# Patient Record
Sex: Female | Born: 1945 | Race: Black or African American | Hispanic: No | Smoking: Never smoker
Health system: Southern US, Community
[De-identification: ages and names within clinical notes are randomized; demographics above are authoritative.]

## PROBLEM LIST (undated history)

## (undated) DIAGNOSIS — J45909 Unspecified asthma, uncomplicated: Secondary | ICD-10-CM

## (undated) DIAGNOSIS — E119 Type 2 diabetes mellitus without complications: Secondary | ICD-10-CM

## (undated) DIAGNOSIS — E039 Hypothyroidism, unspecified: Secondary | ICD-10-CM

## (undated) DIAGNOSIS — I10 Essential (primary) hypertension: Secondary | ICD-10-CM

## (undated) HISTORY — PX: BREAST CYST ASPIRATION: SHX578

## (undated) HISTORY — PX: OTHER SURGICAL HISTORY: SHX169

## (undated) HISTORY — PX: HERNIA REPAIR: SHX51

## (undated) HISTORY — PX: TUBAL LIGATION: SHX77

## (undated) HISTORY — PX: ABDOMINAL HYSTERECTOMY: SHX81

---

## 2004-01-02 ENCOUNTER — Other Ambulatory Visit: Payer: Self-pay

## 2008-12-15 ENCOUNTER — Emergency Department: Payer: Self-pay | Admitting: Emergency Medicine

## 2009-04-01 ENCOUNTER — Observation Stay: Payer: Self-pay | Admitting: Specialist

## 2010-03-15 ENCOUNTER — Ambulatory Visit: Payer: Self-pay | Admitting: Internal Medicine

## 2010-03-15 IMAGING — MG MAM DGTL SCREENING MAMMO W/CAD
1 series · 4 of 4 positions shown · non-contrast
Comparison: none

REASON FOR EXAM: SCREENING
COMMENTS:

[Series 3766: R CC · right · 4 of 4 slices shown]
[im 1/4]
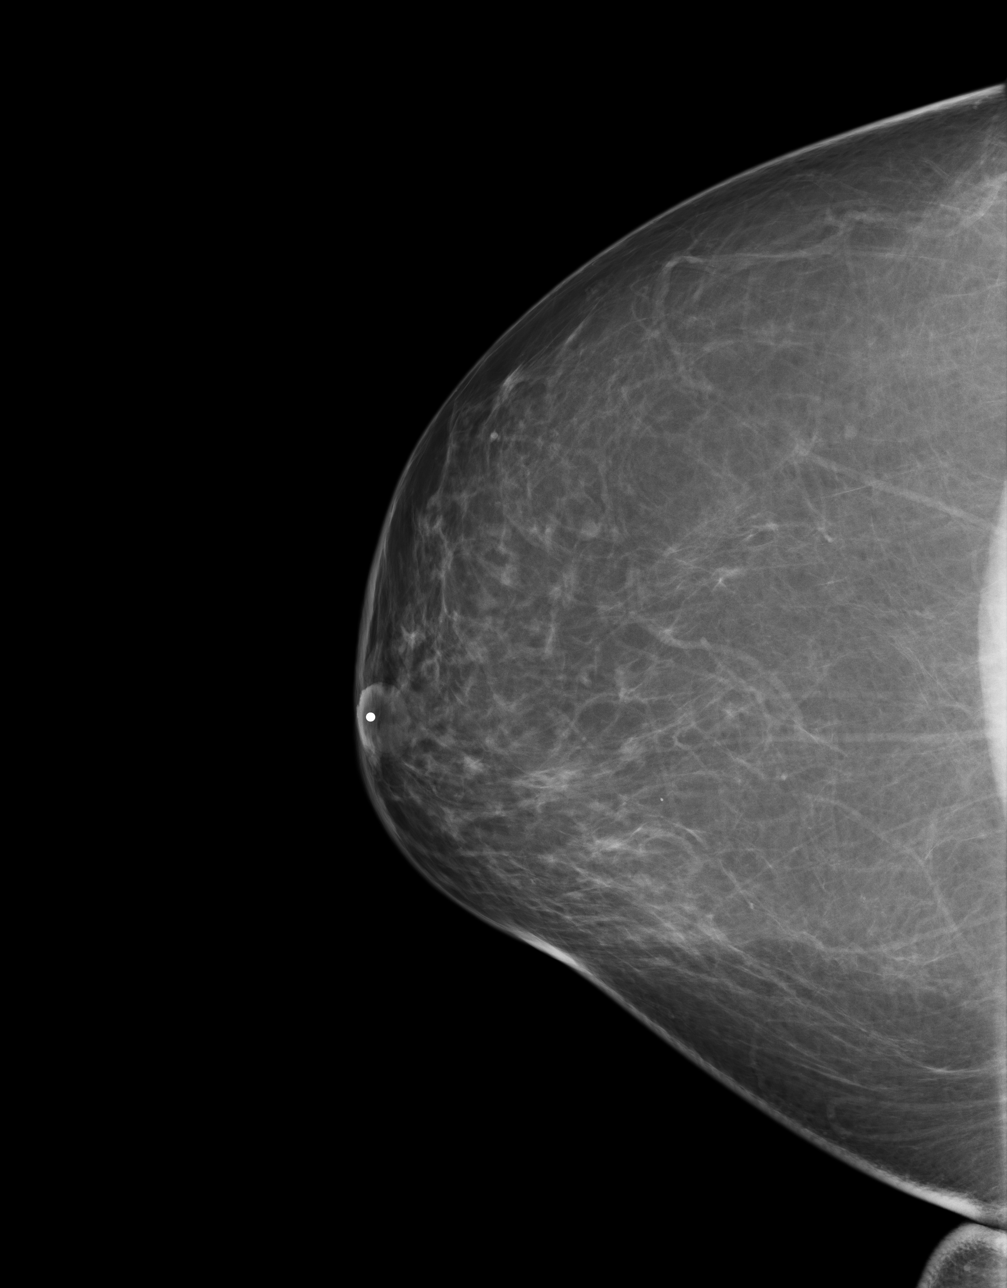
[im 2/4]
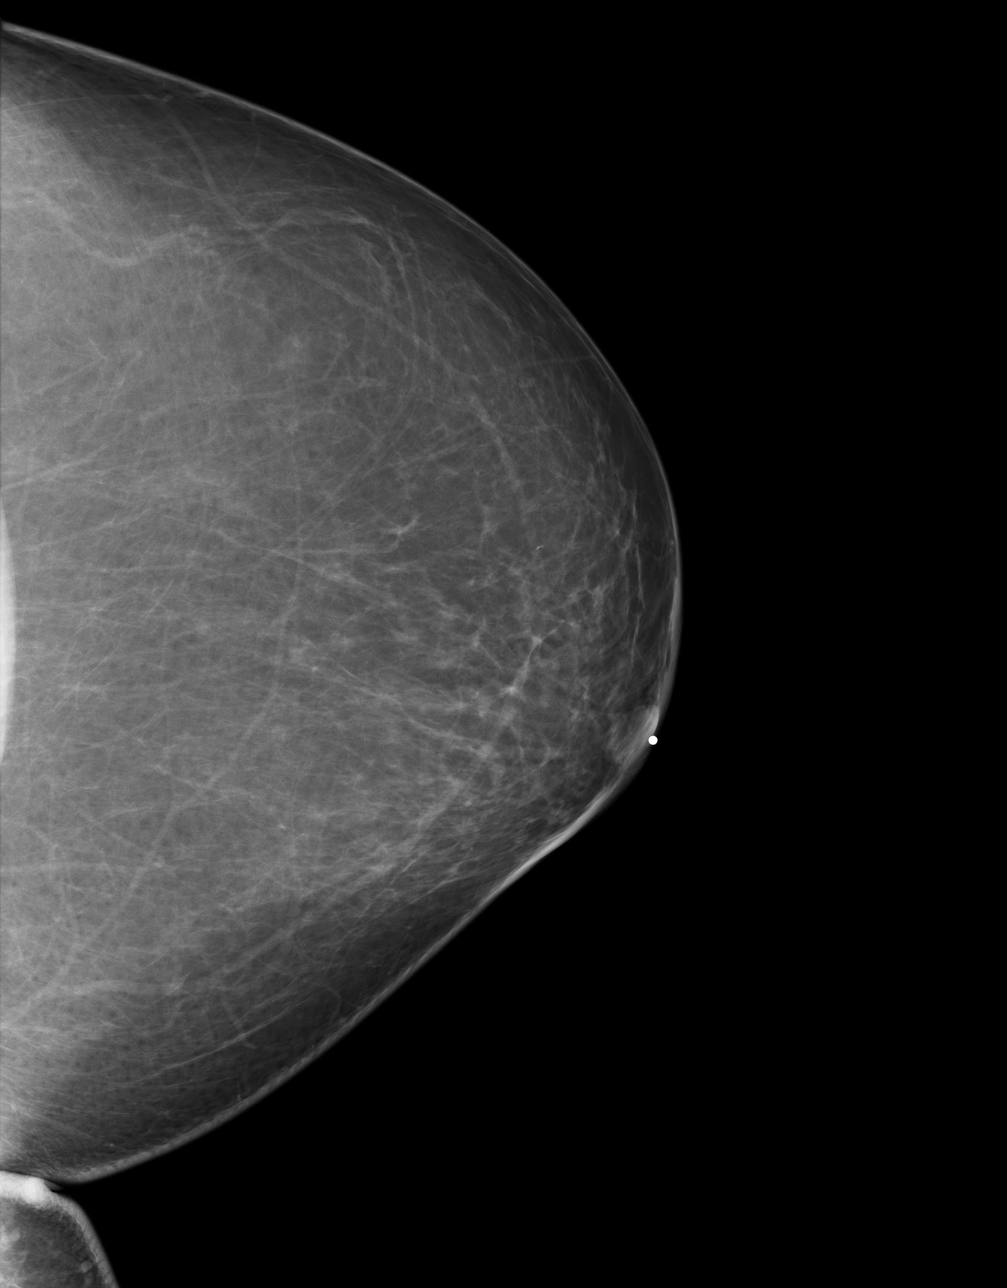
[im 3/4]
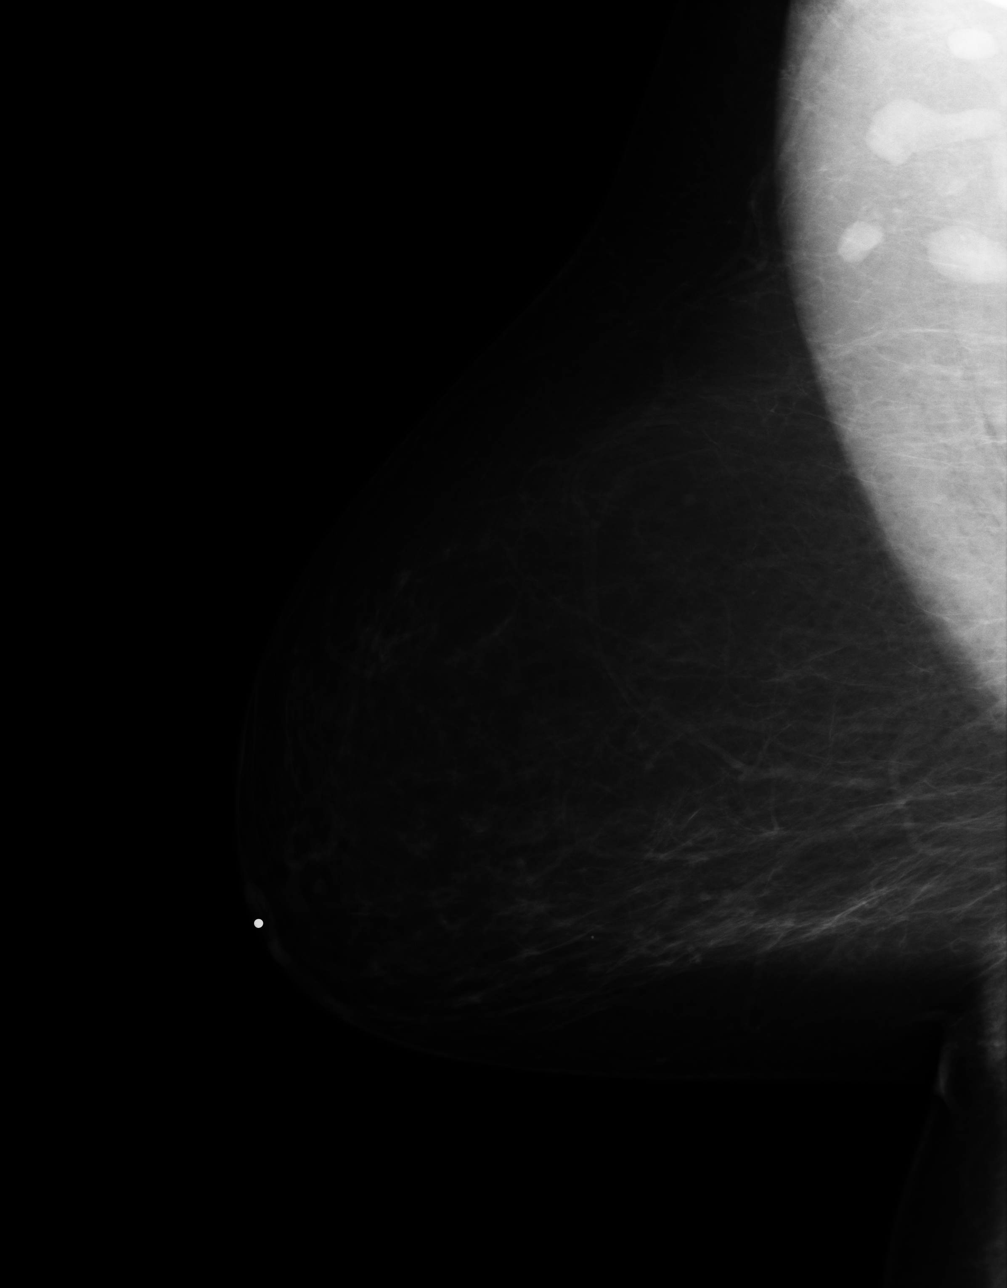
[im 4/4]
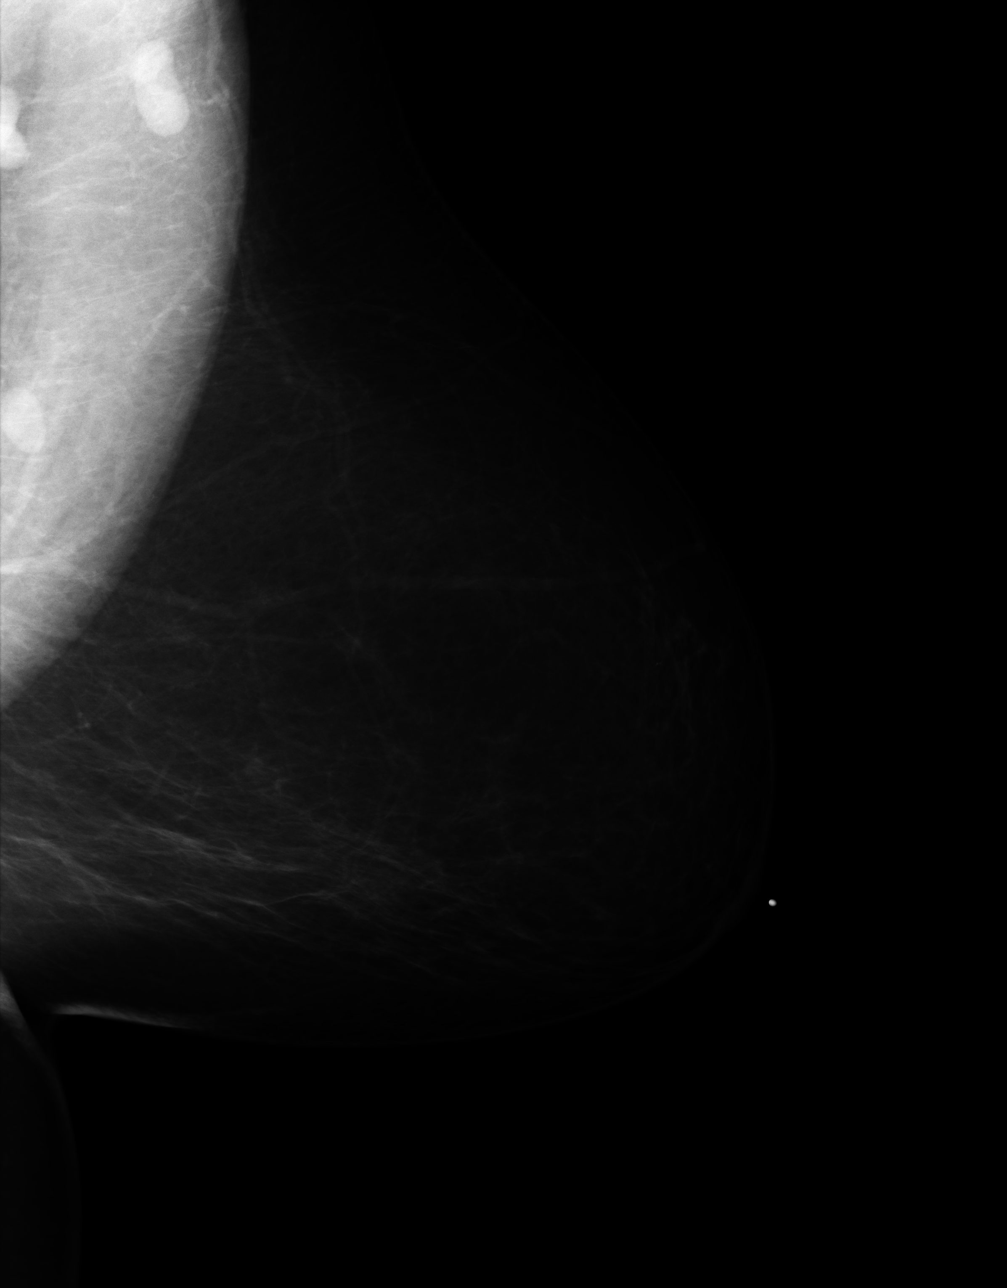

[4 of 4 positions shown; findings below may reference images not displayed]

PROCEDURE:     MAM - MAM DGTL SCREENING MAMMO W/CAD  - [DATE]  [DATE]

RESULT:     Comparison is made to the previous outside images from [REDACTED] [facility] in [HOSPITAL][HOSPITAL] dated [DATE] as well as [DATE].

The breasts exhibit a mildly dense parenchymal pattern without an area of
developing density or dominant mass. No malignant appearing calcification or
architectural distortion is present. The appearance is stable.
IMPRESSION: Stable, benign appearing bilateral mammogram.

BI-RADS: Category 2 - Benign Findings

RECOMMENDATION:  Please continue to encourage annual mammographic follow-up.

Thank you for this opportunity to contribute to the care of your patient.

A NEGATIVE MAMMOGRAM REPORT DOES NOT PRECLUDE BIOPSY OR OTHER EVALUATION OF
A CLINICALLY PALPABLE OR OTHERWISE SUSPICIOUS MASS OR LESION. BREAST CANCER
MAY NOT BE DETECTED BY MAMMOGRAPHY IN UP TO 10% OF CASES.

## 2010-09-10 ENCOUNTER — Ambulatory Visit: Payer: Self-pay | Admitting: Internal Medicine

## 2010-09-10 IMAGING — CT CT ABD-PELV W/ CM
1 of 2 series · 15 of 32 positions shown, 19 images · non-contrast
Comparison: none

REASON FOR EXAM: Abd Pain Knot Near Navel Eval for Hernia
COMMENTS:

[Series 2: abdomen · axial · 0.81mm/px · z∈[-929,-494]mm · 15 of 95 slices shown, 19 images]
[im 4/95  soft-tissue]
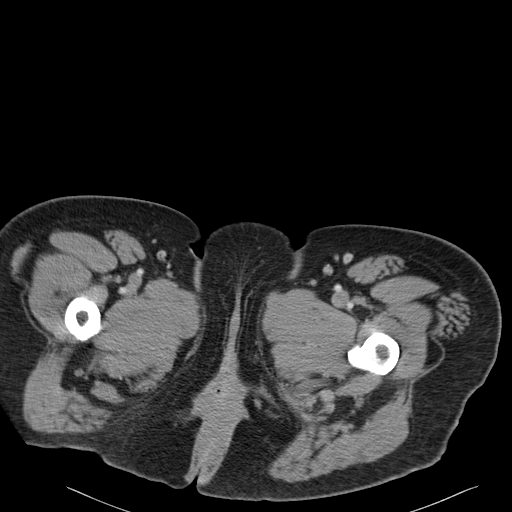
[im 4/95  bone]
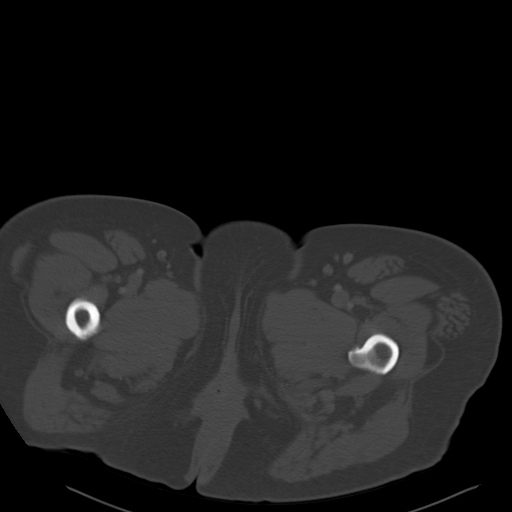
[im 12/95  soft-tissue]
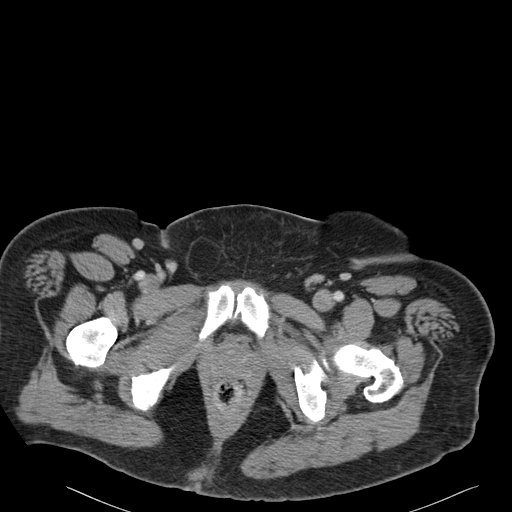
[im 20/95  soft-tissue]
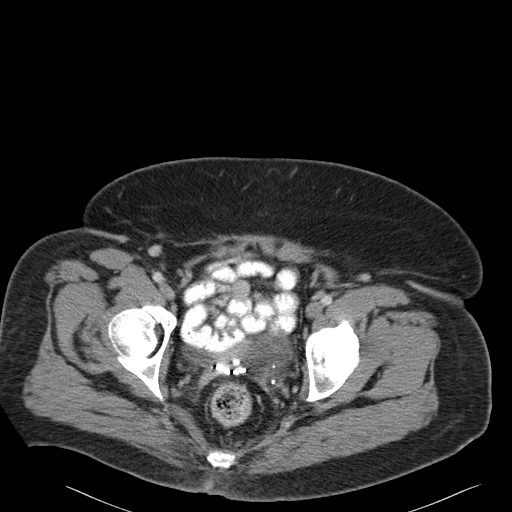
[im 28/95  soft-tissue]
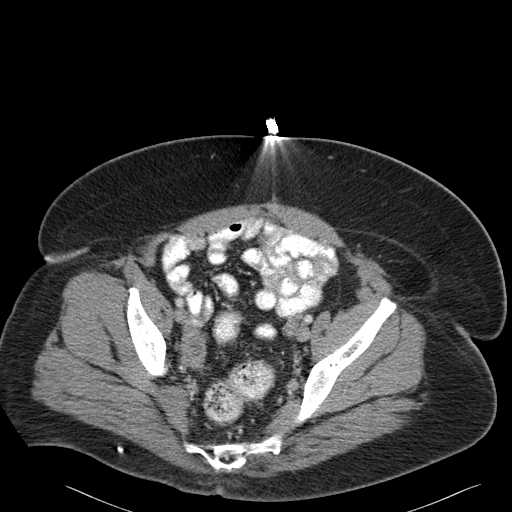
[im 32/95  soft-tissue]
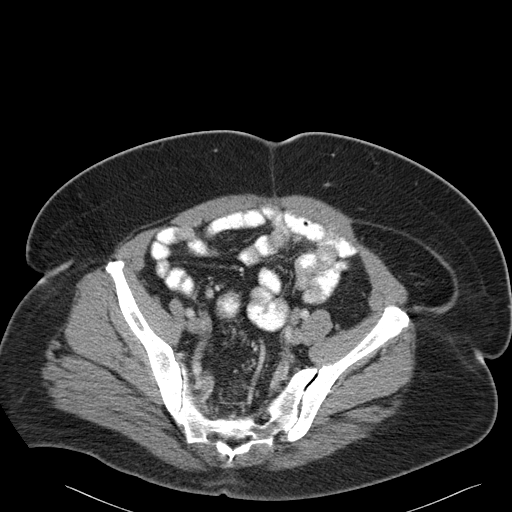
[im 40/95  soft-tissue]
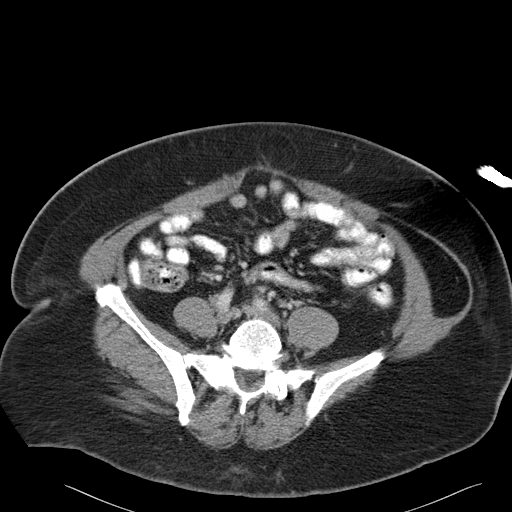
[im 48/95  soft-tissue]
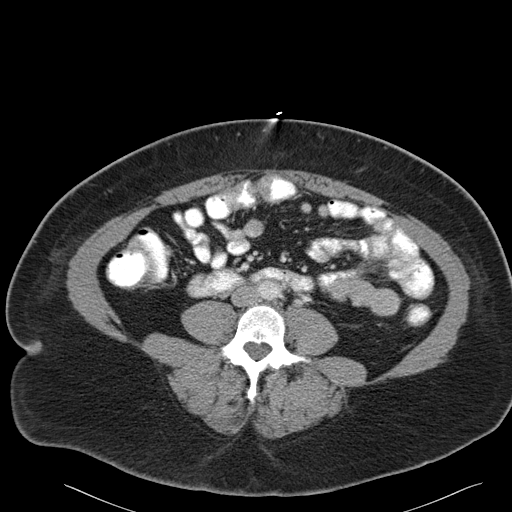
[im 55/95  soft-tissue]
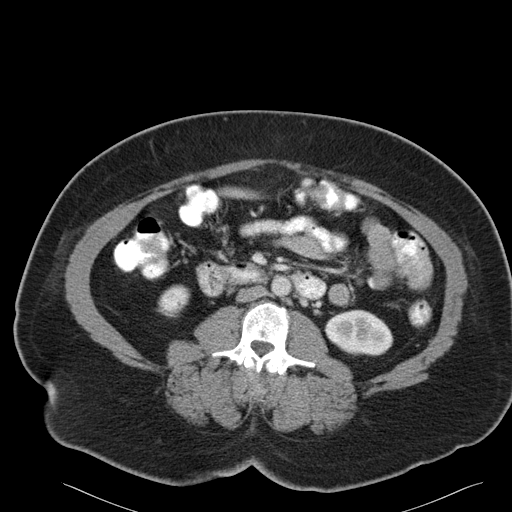
[im 63/95  soft-tissue]
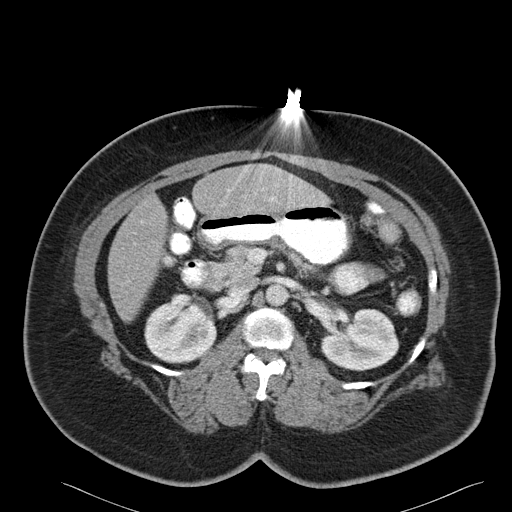
[im 63/95  bone]
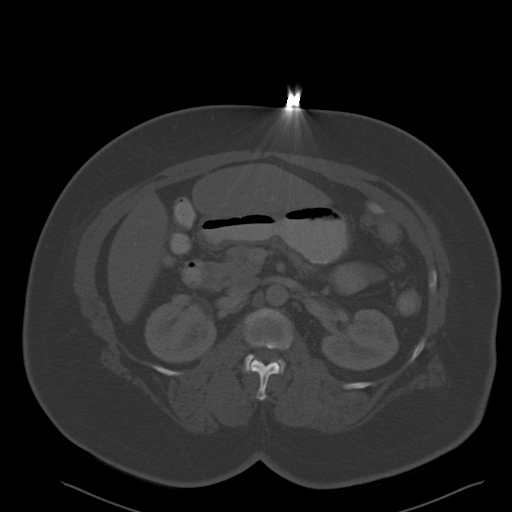
[im 67/95  soft-tissue]
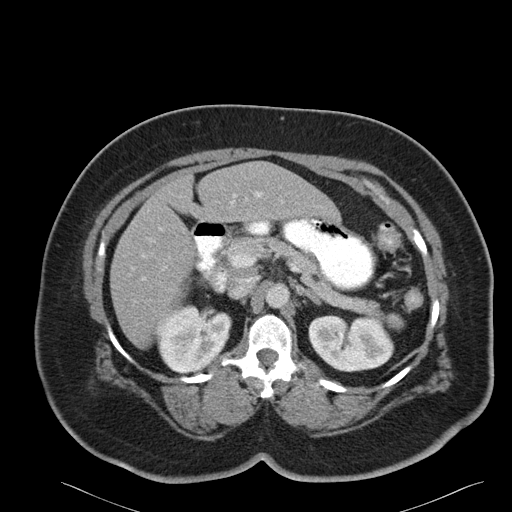
[im 75/95  soft-tissue]
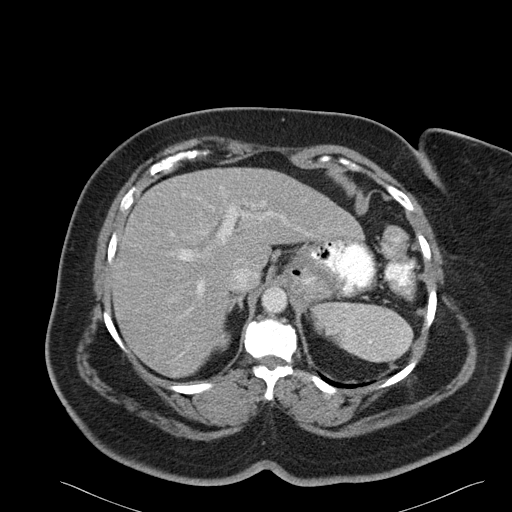
[im 79/95  lung]
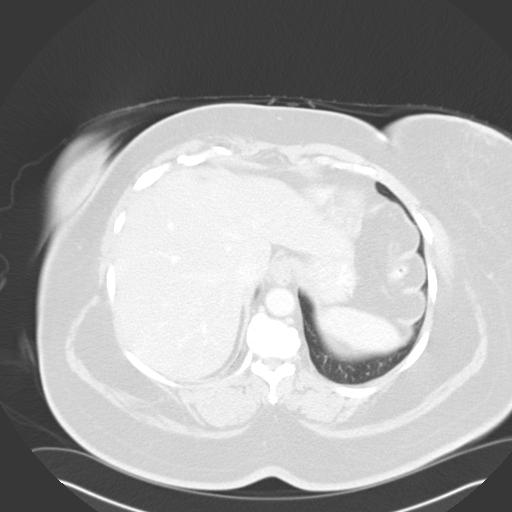
[im 83/95  soft-tissue]
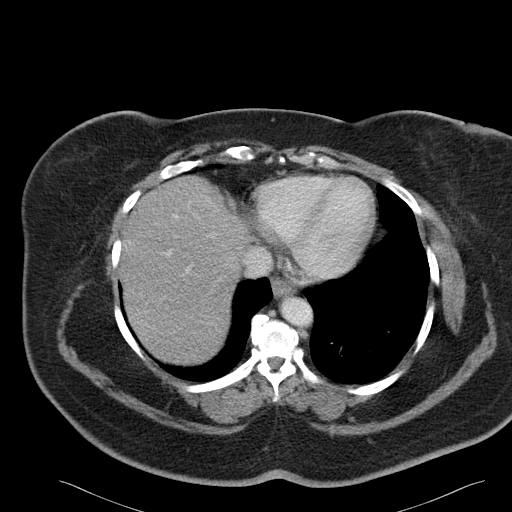
[im 83/95  lung]
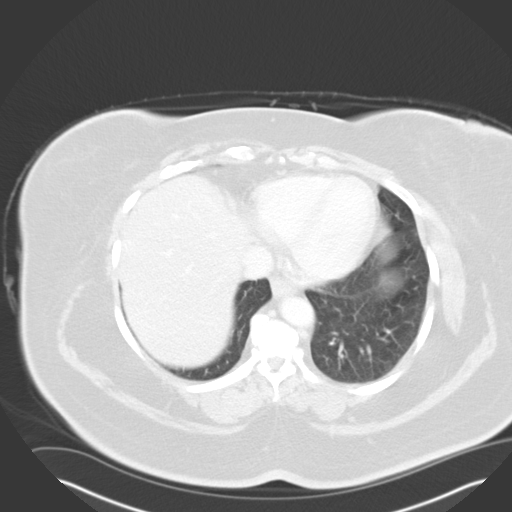
[im 87/95  lung]
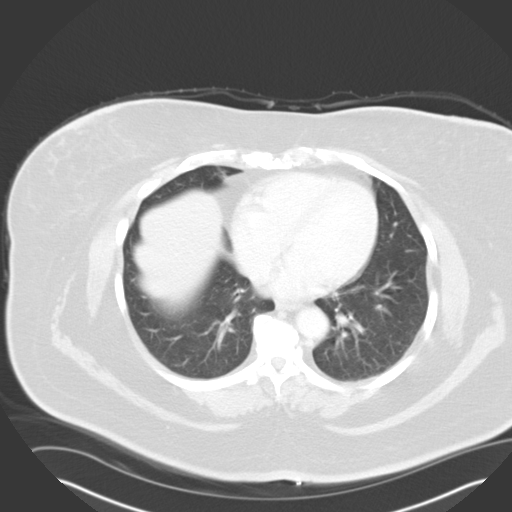
[im 91/95  soft-tissue]
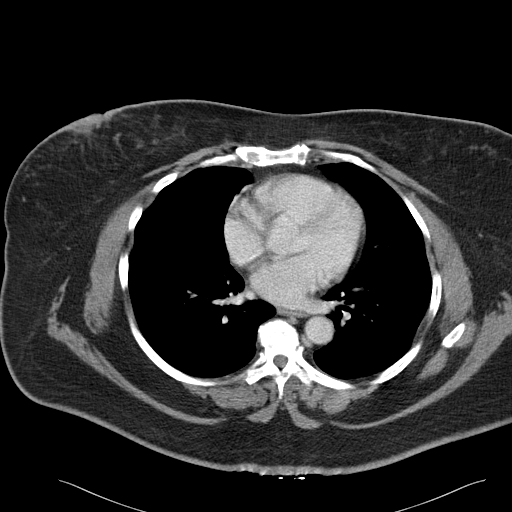
[im 91/95  lung]
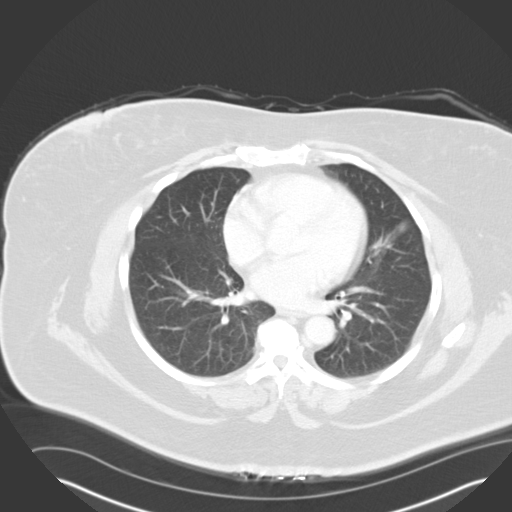

[15 of 32 positions shown; findings below may reference images not displayed]

PROCEDURE:     CT  - CT ABDOMEN / PELVIS  W  - [DATE]  [DATE]

RESULT:     Axial CT scanning was performed through the abdomen and pelvis
at 5 mm intervals and slice thicknesses following intravenous administration
of 100 cc of [U1] as well as administration of oral contrast material.
Review of multiplanar reconstructed images was performed separately on the
VIA monitor.

There is a periumbilical hernia. This hernia contains only fat in its most
superficial portion. In the deeper portion there is a loop of normal
appearing small bowel. There is no evidence of incarceration. The partially
contrast-filled loops of small and large bowel exhibit no evidence of
obstruction. The liver, spleen, partially distended stomach, pancreas,
adrenal glands, and kidneys are normal in appearance. The caliber of the
abdominal aorta is normal. I see no periaortic or pericaval lymphadenopathy.
Within the pelvis the urinary bladder is normal in appearance. The uterus is
apparently surgically absent. I see no suspicious appearing adnexal masses.
There is a structure which likely reflects an ovary on the right on image 69
and on the left on image 70. The lumbar vertebral bodies are preserved in
height. The lung bases are clear.
IMPRESSION: 1. There is a periunbilical hernia which contains only fat. However, closely
applied to its deeper portion there is a loop of small bowel. There may be
transient movement of this bowel loop into the hernia with changes in
position and intra-abdominal pressure.
2. There is no evidence of bowel obstruction.
3. I see no acute hepatobiliary abnormality nor acute abnormality of the
urinary tracts. The lumbar vertebral bodies are preserved in height. The
lung bases are clear.

## 2010-10-12 ENCOUNTER — Ambulatory Visit: Payer: Self-pay | Admitting: Surgery

## 2010-10-19 ENCOUNTER — Ambulatory Visit: Payer: Self-pay | Admitting: Surgery

## 2011-11-04 ENCOUNTER — Ambulatory Visit: Payer: Self-pay

## 2011-11-04 IMAGING — MG MAM DGTL SCRN MAM NO ORDER W/CAD
1 series · 4 of 4 positions shown · non-contrast
Comparison: none

REASON FOR EXAM: SCR MAMMO NO ORDER
COMMENTS:

[Series 9852: R CC · right · 4 of 4 slices shown]
[im 1/4]
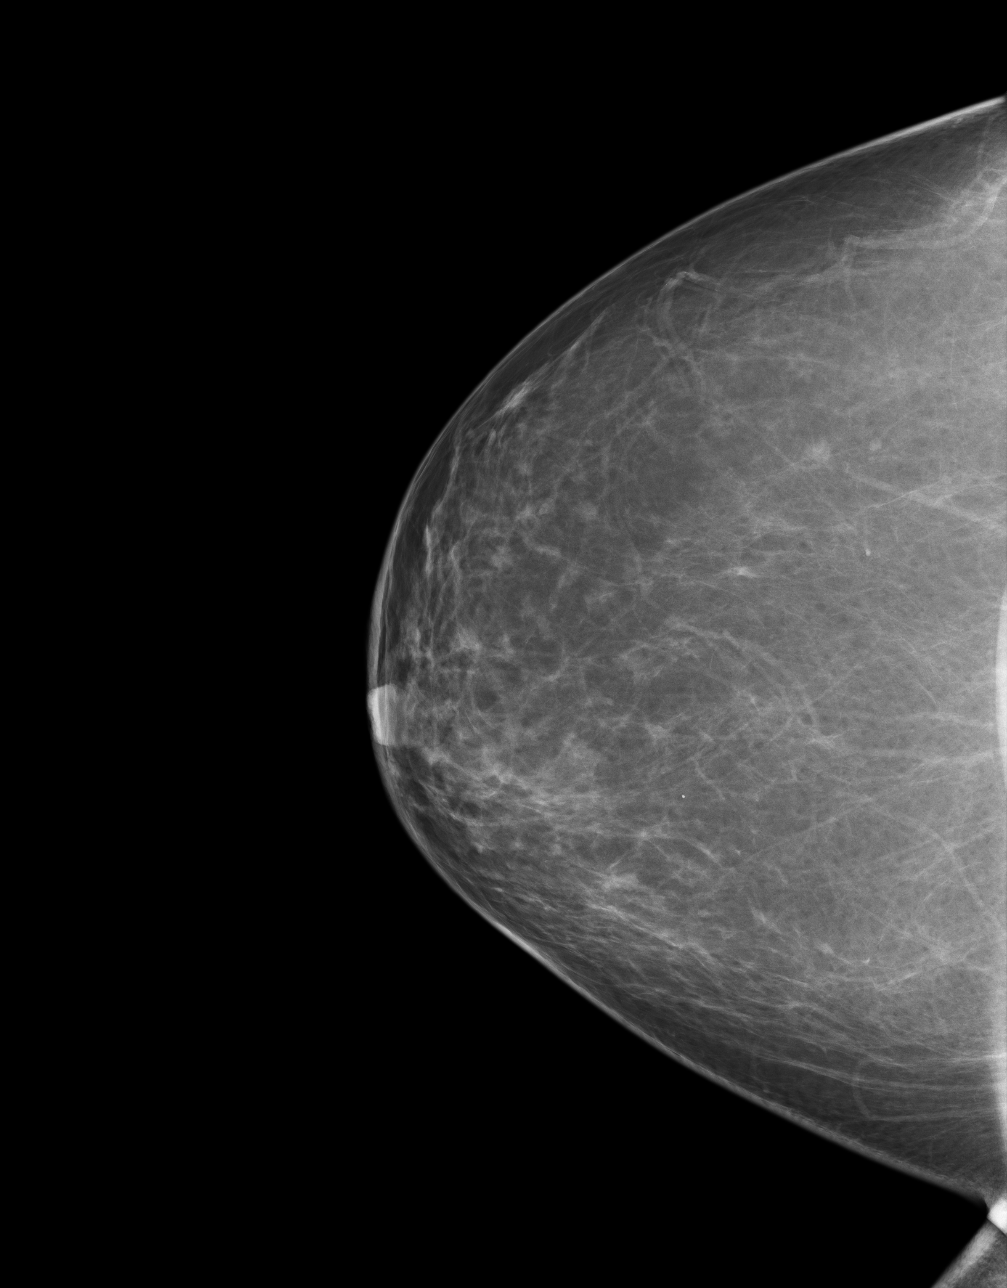
[im 2/4]
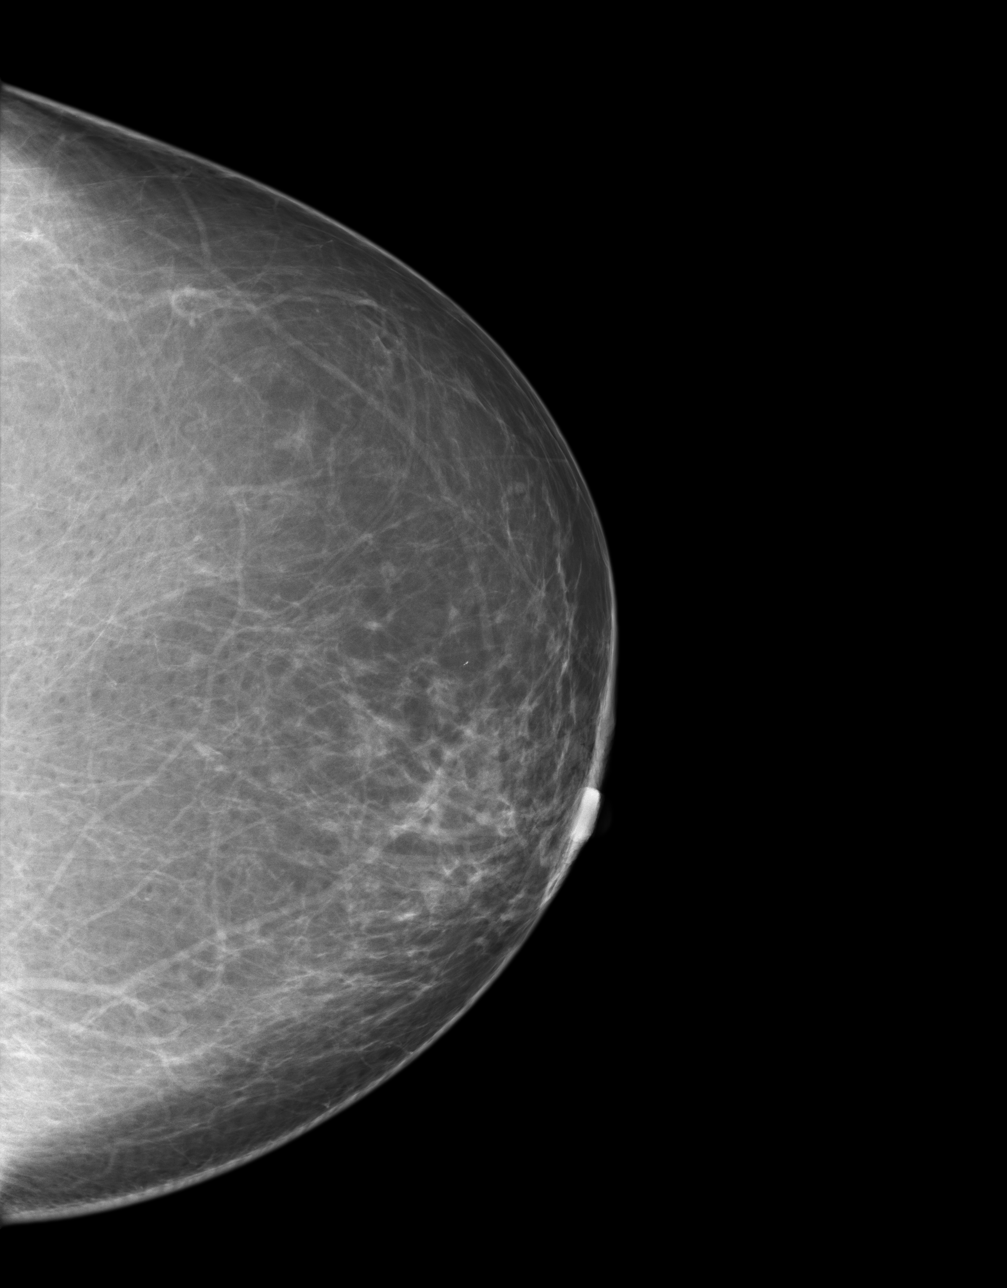
[im 3/4]
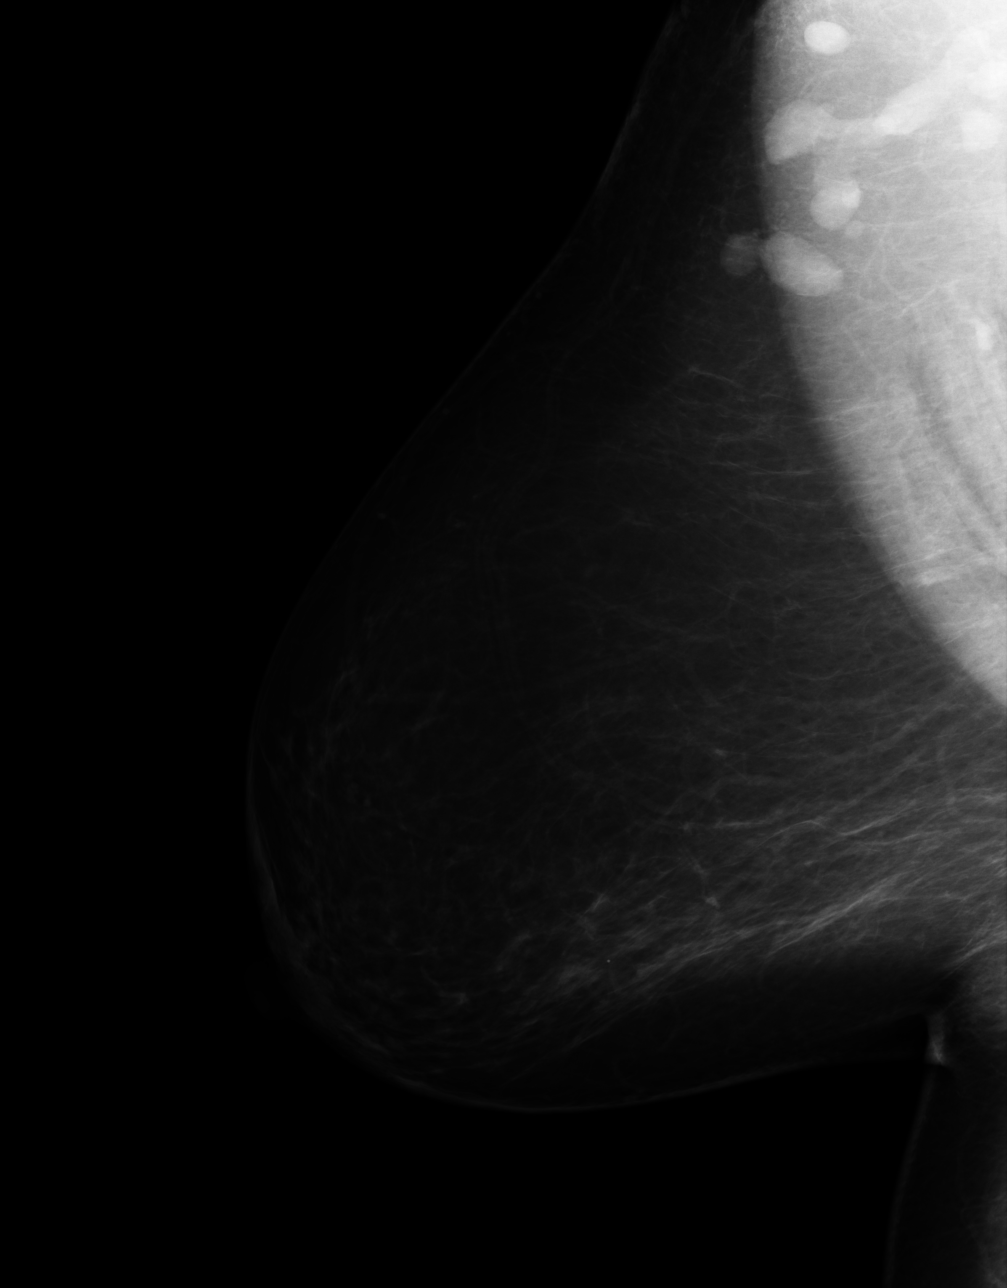
[im 4/4]
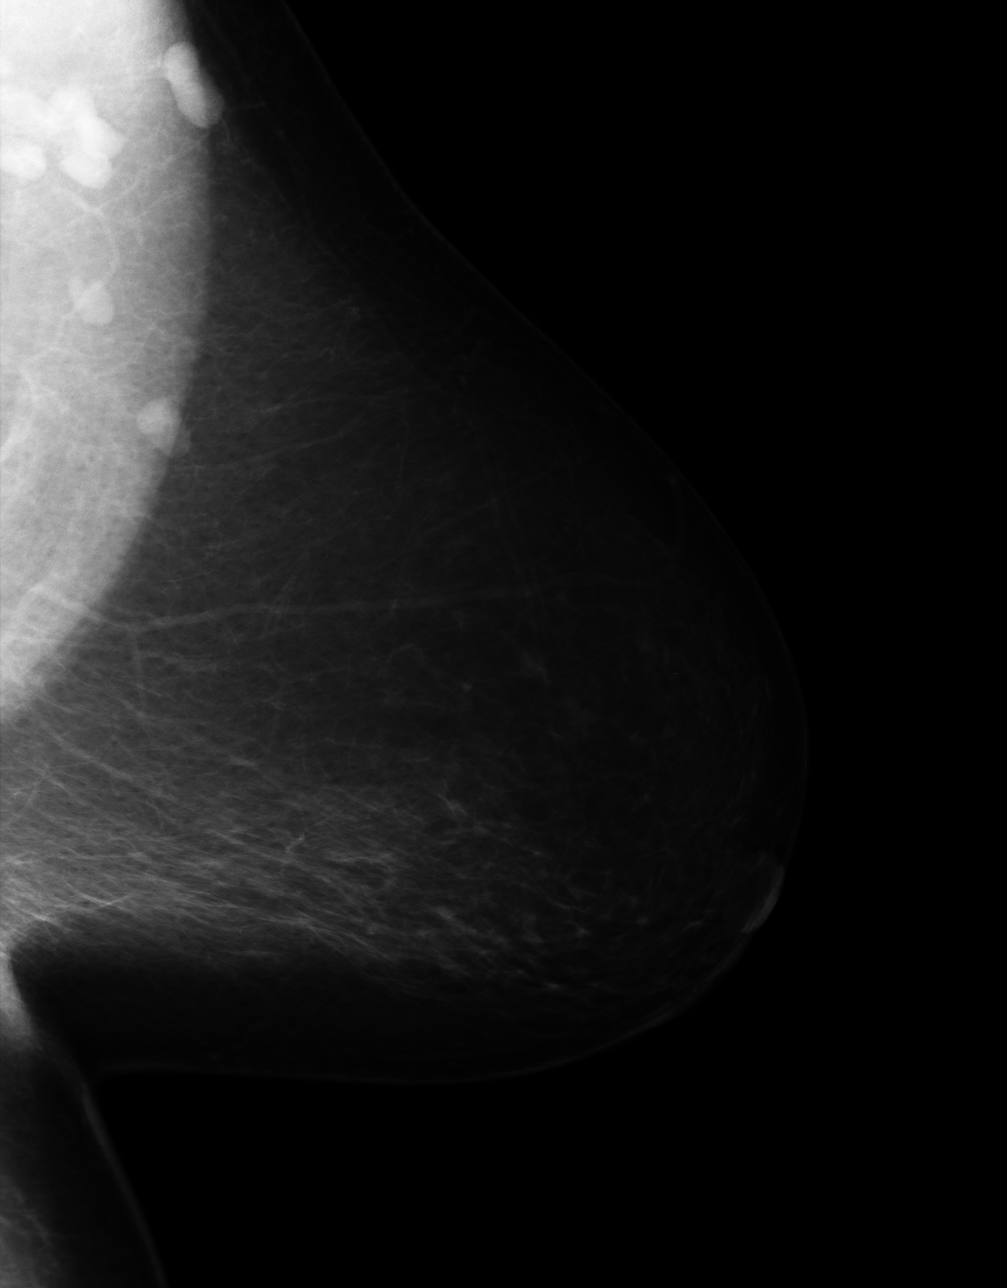

[4 of 4 positions shown; findings below may reference images not displayed]

PROCEDURE:     MAM - MAM DGTL SCRN MAM NO ORDER W/CAD  - [DATE]  [DATE]

RESULT:

The breasts demonstrate a mild heterogeneous, fibroglandular parenchymal
pattern. A small area of asymmetric, spiculated density projects in the
lateral portion of the right breast, approximately 10 cm from the nipple.
This likely represents a conglomeration of normal breast parenchyma and
further evaluation with magnification compression imaging is recommended.
There is no further radiographic evidence to suggest malignancy.
IMPRESSION: BI-RADS:  Category 0 - Needs Additional Imaging Evaluation

Thank you for this opportunity to contribute to the care of your patient.

A NEGATIVE MAMMOGRAM REPORT DOES NOT PRECLUDE BIOPSY OR OTHER EVALUATION OF
A CLINICALLY PALPABLE OR OTHERWISE SUSPICIOUS MASS OR LESION. BREAST CANCER
MAY NOT BE DETECTED BY MAMMOGRAPHY IN UP TO 10% OF CASES.

## 2011-11-14 ENCOUNTER — Ambulatory Visit: Payer: Self-pay

## 2012-07-02 ENCOUNTER — Emergency Department: Payer: Self-pay | Admitting: Emergency Medicine

## 2012-07-02 IMAGING — CR DG FOOT COMPLETE 3+V*L*
1 series · 3 of 3 positions shown · non-contrast
Comparison: none

REASON FOR EXAM: pain
COMMENTS:

PROCEDURE:     DXR - DXR FOOT LT COMP W/OBLIQUES  - [DATE]  [DATE]
RESULT:     Comparison: None.

[Series 1: x foot ap left · 0.14mm/px · 3 of 3 slices shown]
[im 1/3]
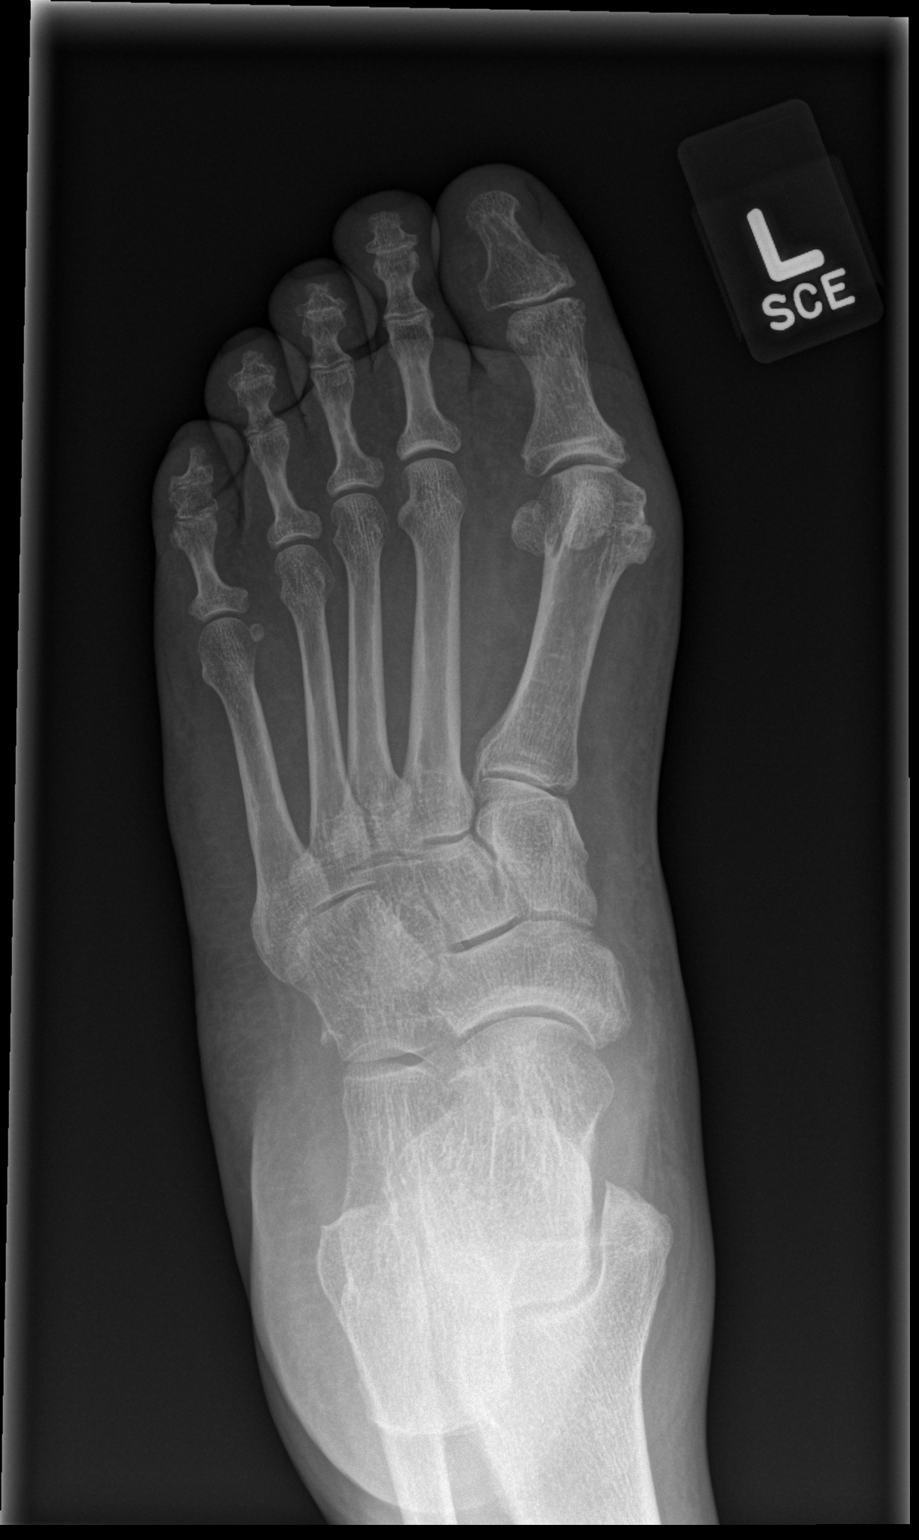
[im 2/3]
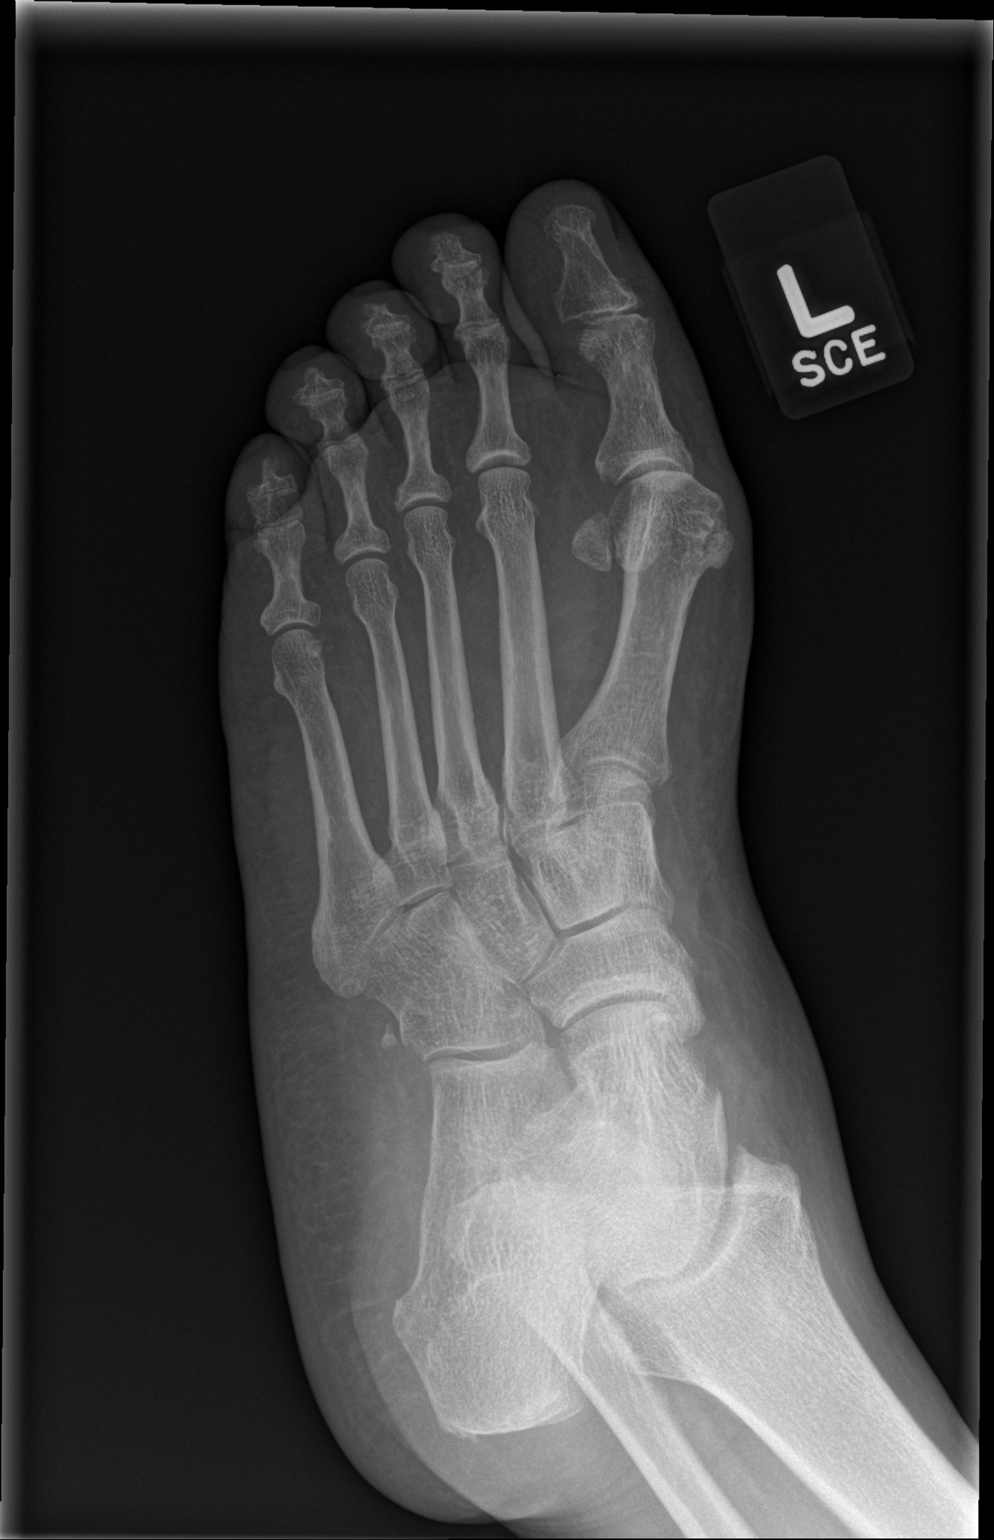
[im 3/3]
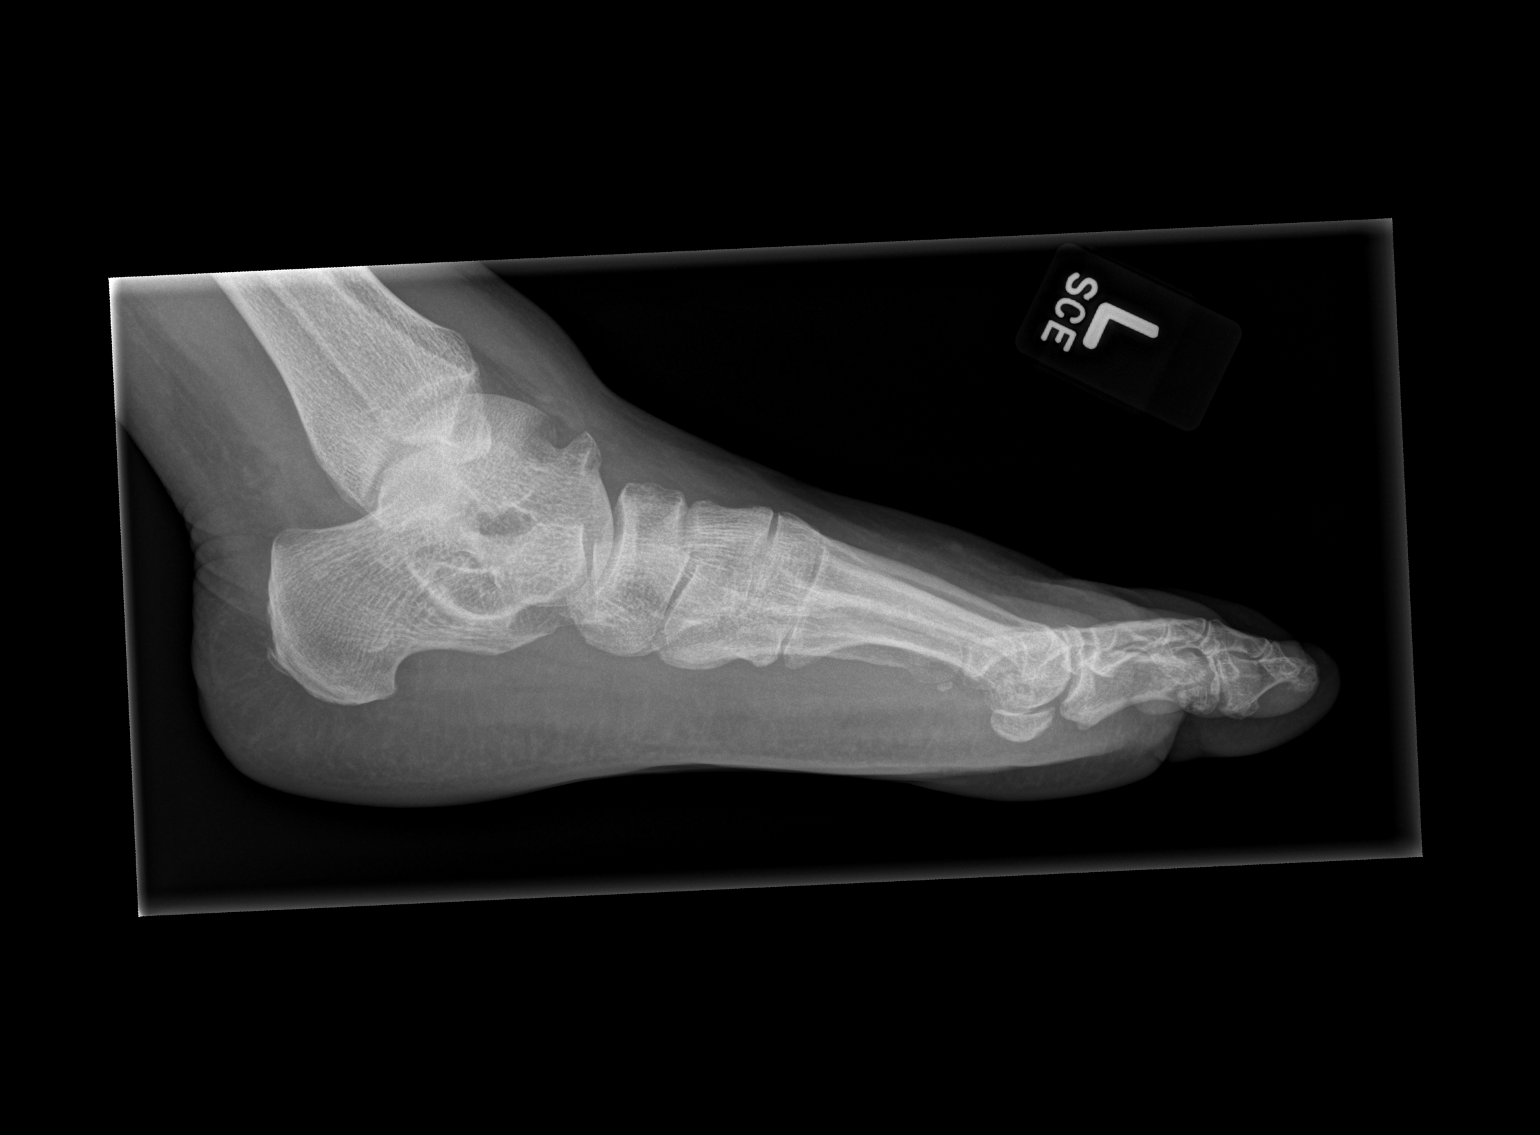

[3 of 3 positions shown; findings below may reference images not displayed]

FINDINGS: No acute fracture. There is hallux valgus. Joint spaces are relatively
preserved.
IMPRESSION: No acute findings.

[REDACTED]

## 2012-12-10 ENCOUNTER — Ambulatory Visit: Payer: Self-pay

## 2012-12-10 IMAGING — MG MM CAD SCREENING MAMMO
1 series · 4 of 4 positions shown · non-contrast
Comparison: none

REASON FOR EXAM: SCR MAMMO NO ORDER
COMMENTS:

PROCEDURE:     MAM - MAM DGTL SCRN MAM NO ORDER W/CAD  - [DATE] [DATE]
RESULT:     COMPARISON:  [DATE], [DATE]
TECHNIQUE: Digital screening mammograms were obtained. FDA approved
computer-aided detection (CAD) for mammography was utilized for this study.
BREAST COMPOSITION: The breast composition is SCATTERED FIBROGLANDULAR
TISSUE (glandular tissue is  25-50%)

[R CC · right · 4 of 4 slices shown]
[im 1/4]
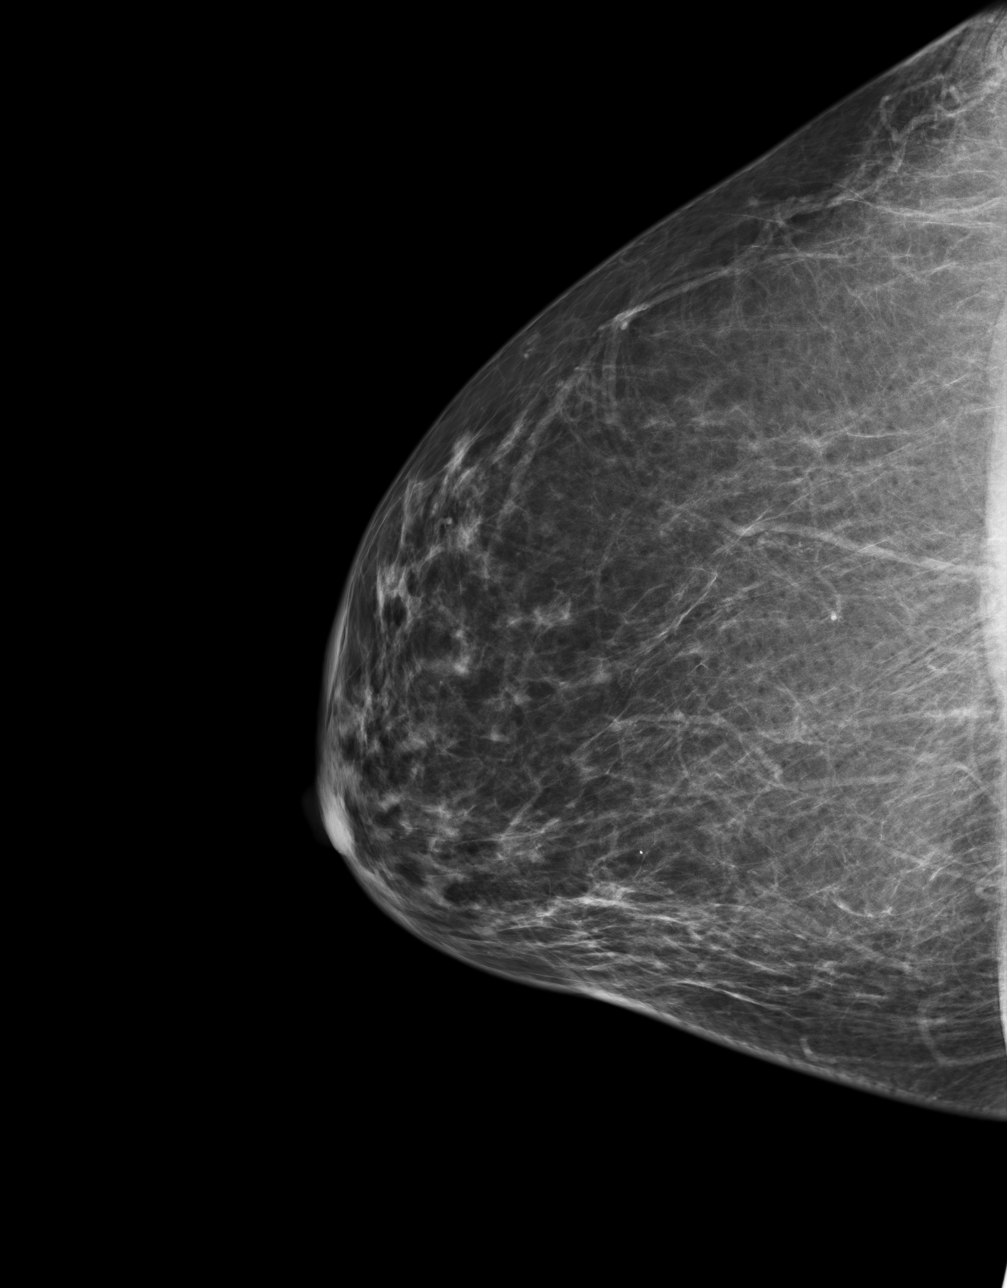
[im 2/4]
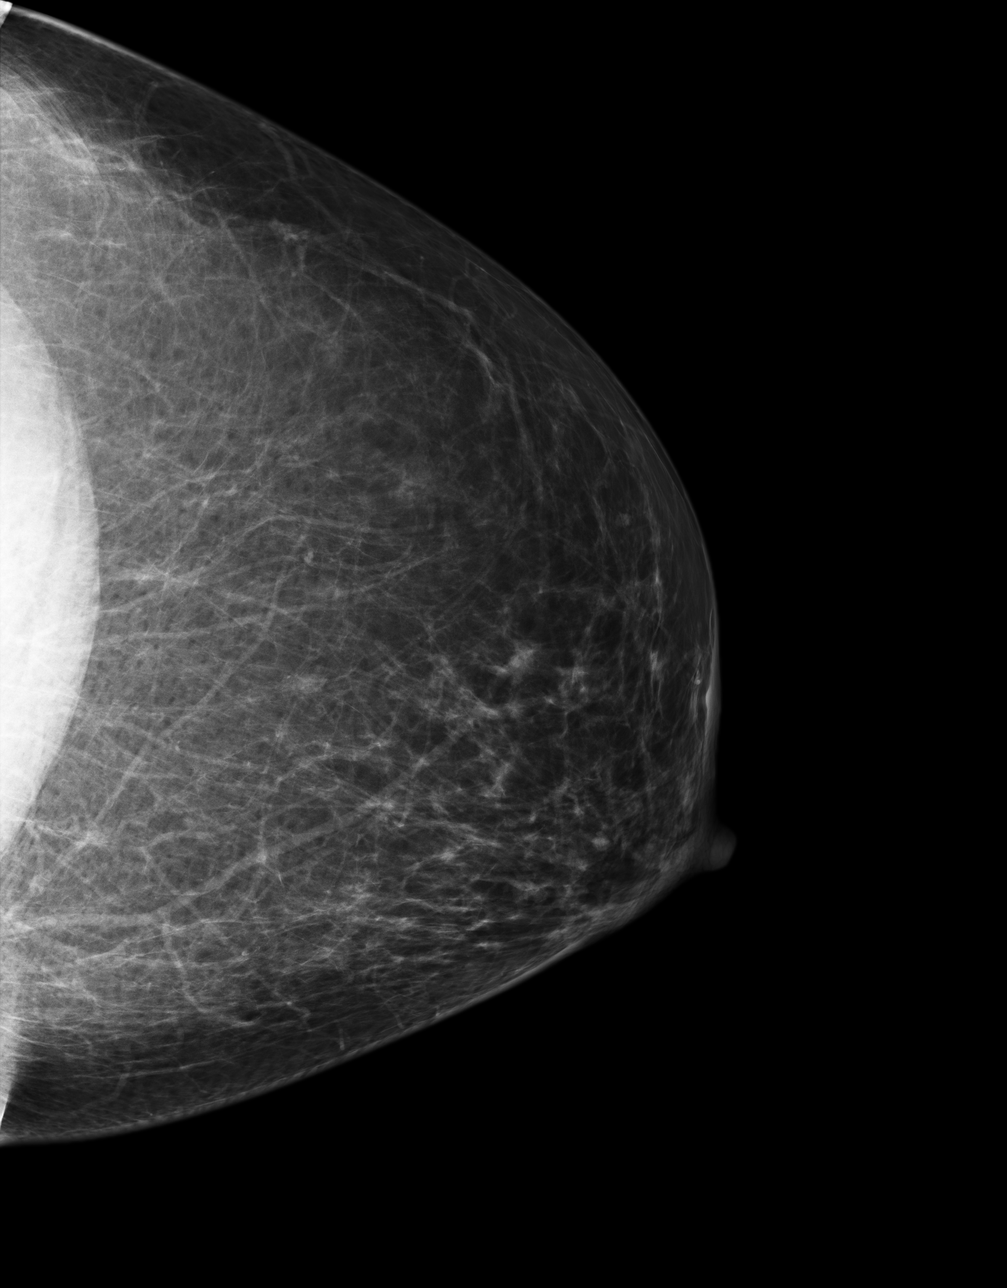
[im 3/4]
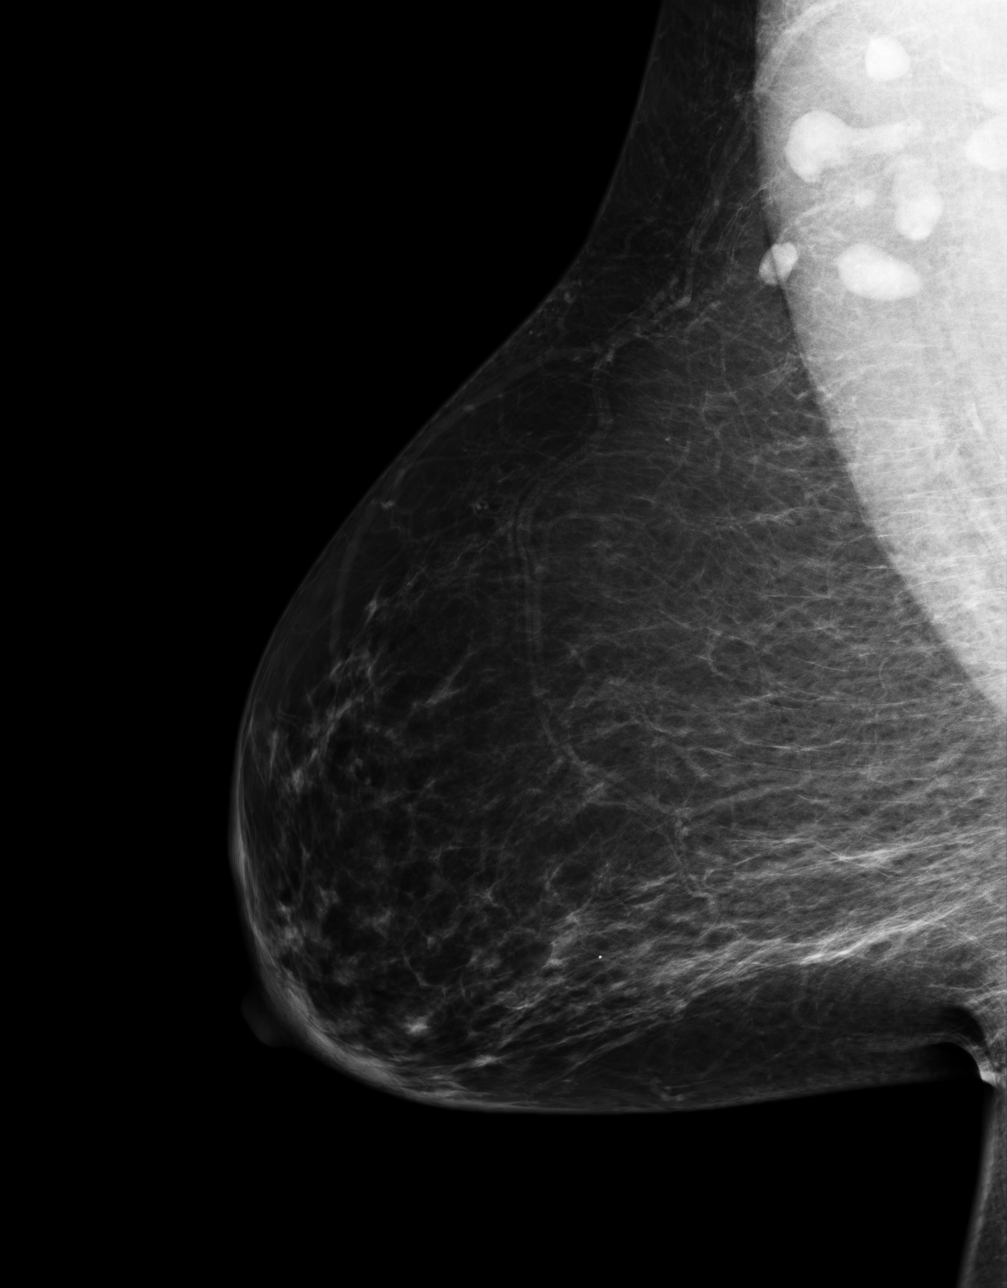
[im 4/4]
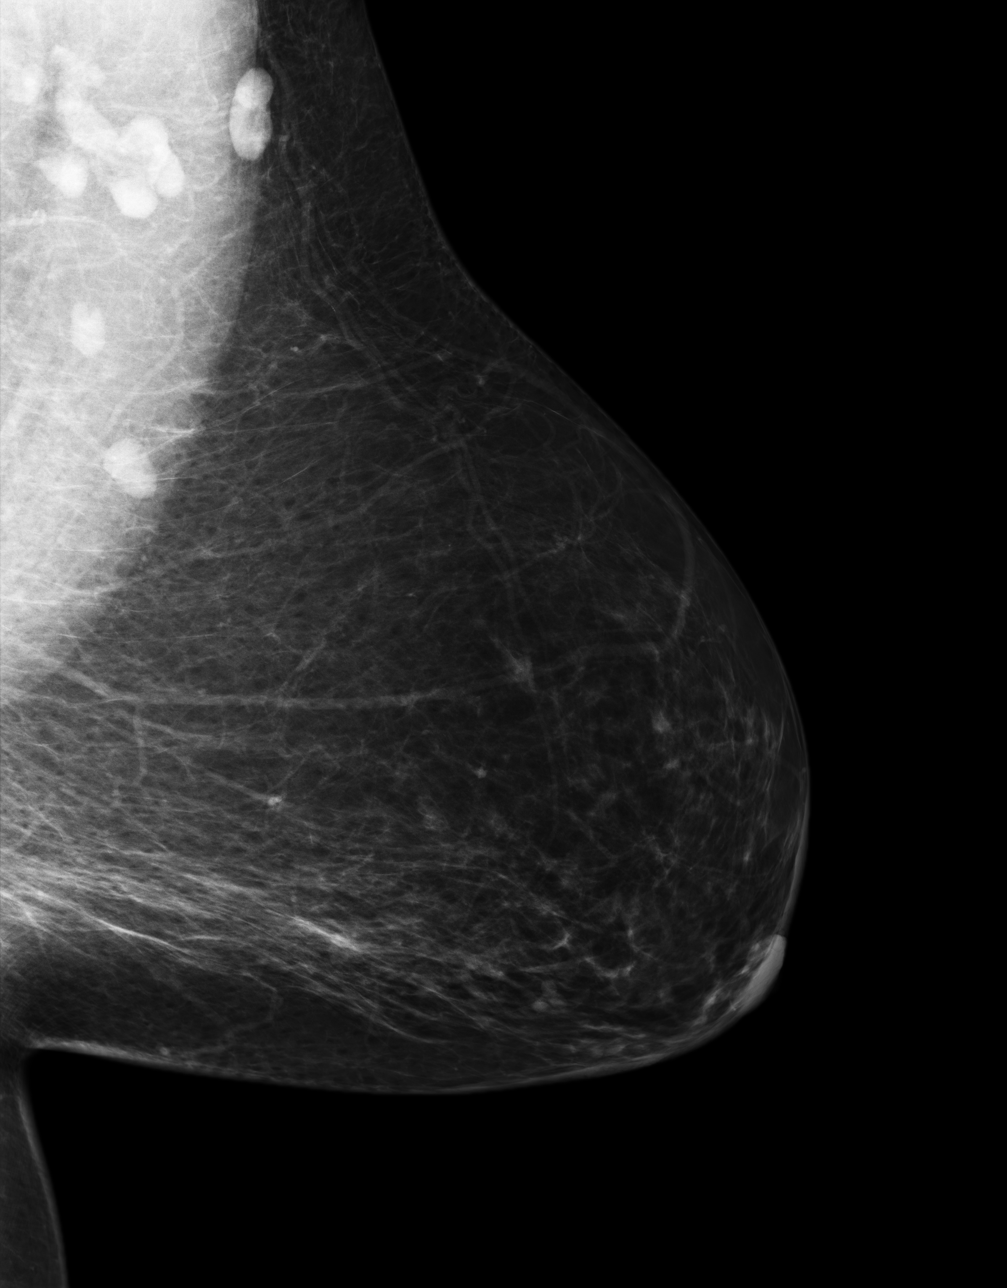

[4 of 4 positions shown; findings below may reference images not displayed]

FINDING: There is no dominant mass, architectural distortion or clusters of
suspicious microcalcifications.
IMPRESSION: 1.     Stable bilateral mammogram.
2.     Annual mammographic follow up recommended.

BI-RADS:  Category 1- Negative

A negative mammogram report does not preclude biopsy or other evaluation of
a clinically palpable or otherwise suspicious mass or lesion. Breast cancer
may not be detected by mammography in up to 10% of cases.

[REDACTED]

## 2013-04-16 ENCOUNTER — Ambulatory Visit: Payer: Self-pay

## 2013-04-16 IMAGING — CR DG LUMBAR SPINE COMPLETE 4+V
1 series · 5 of 5 positions shown · non-contrast
Comparison: none

REASON FOR EXAM: low back pain
COMMENTS:

[Series 1: ap · 0.17mm/px · 5 of 5 slices shown]
[im 1/5]
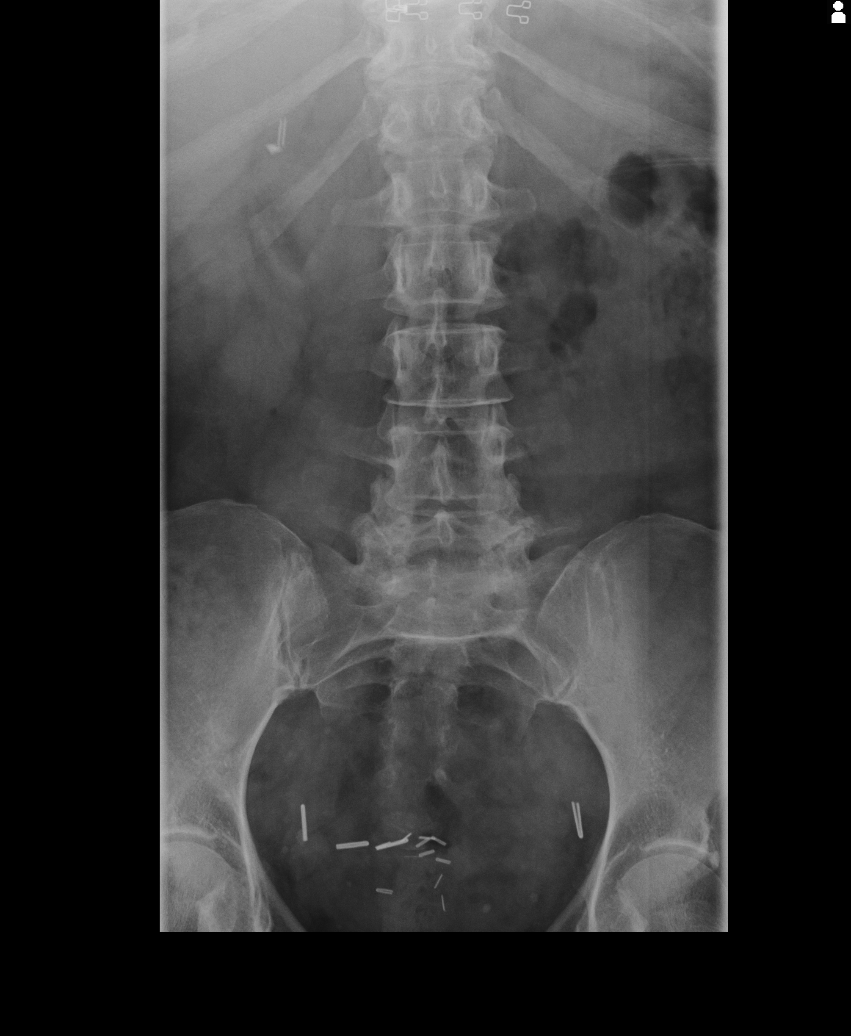
[im 2/5]
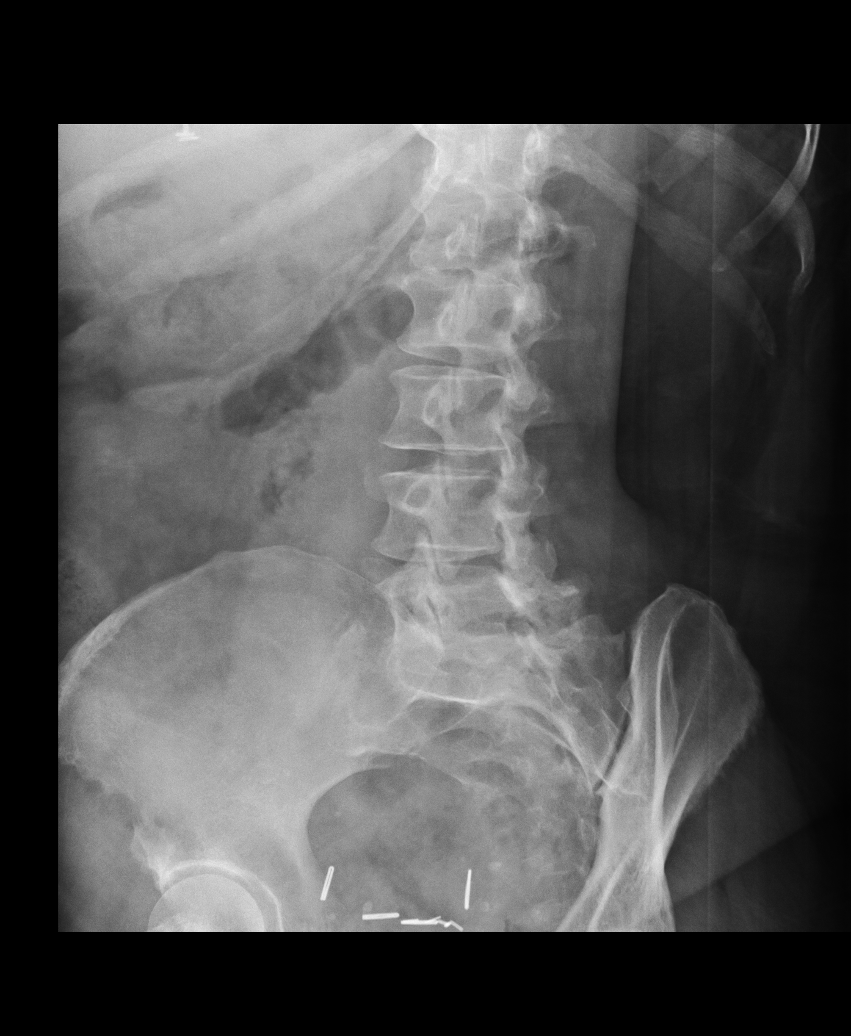
[im 3/5]
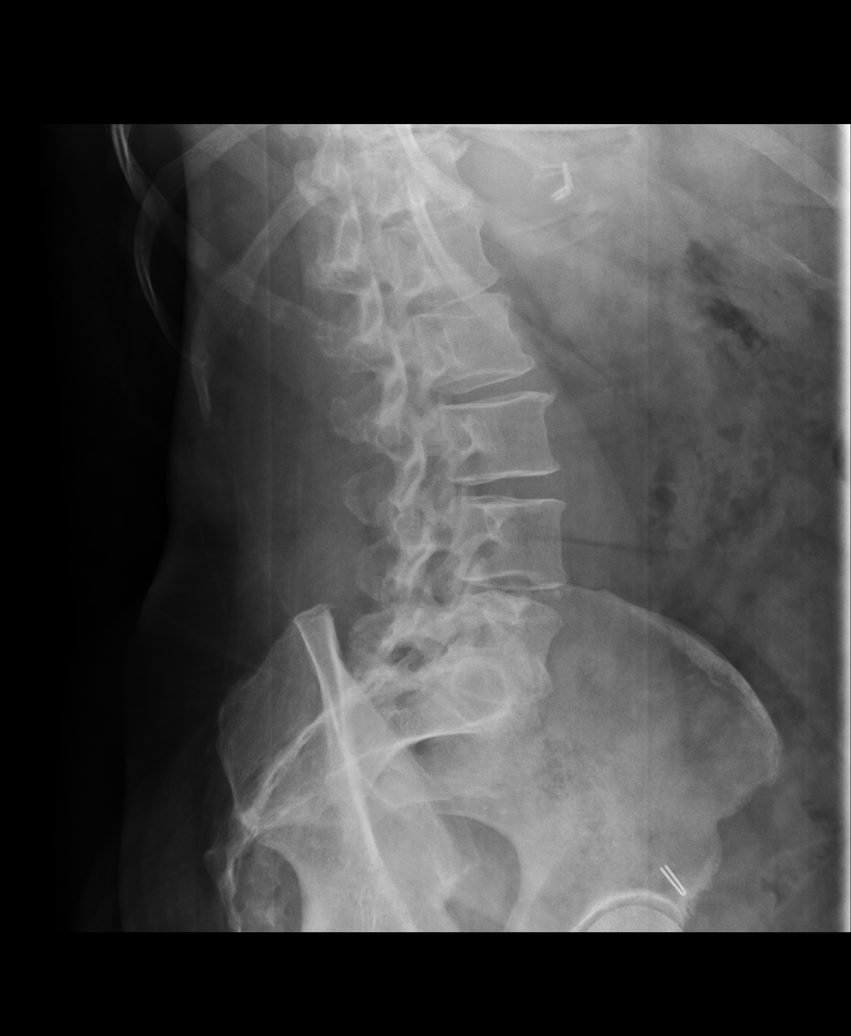
[im 4/5]
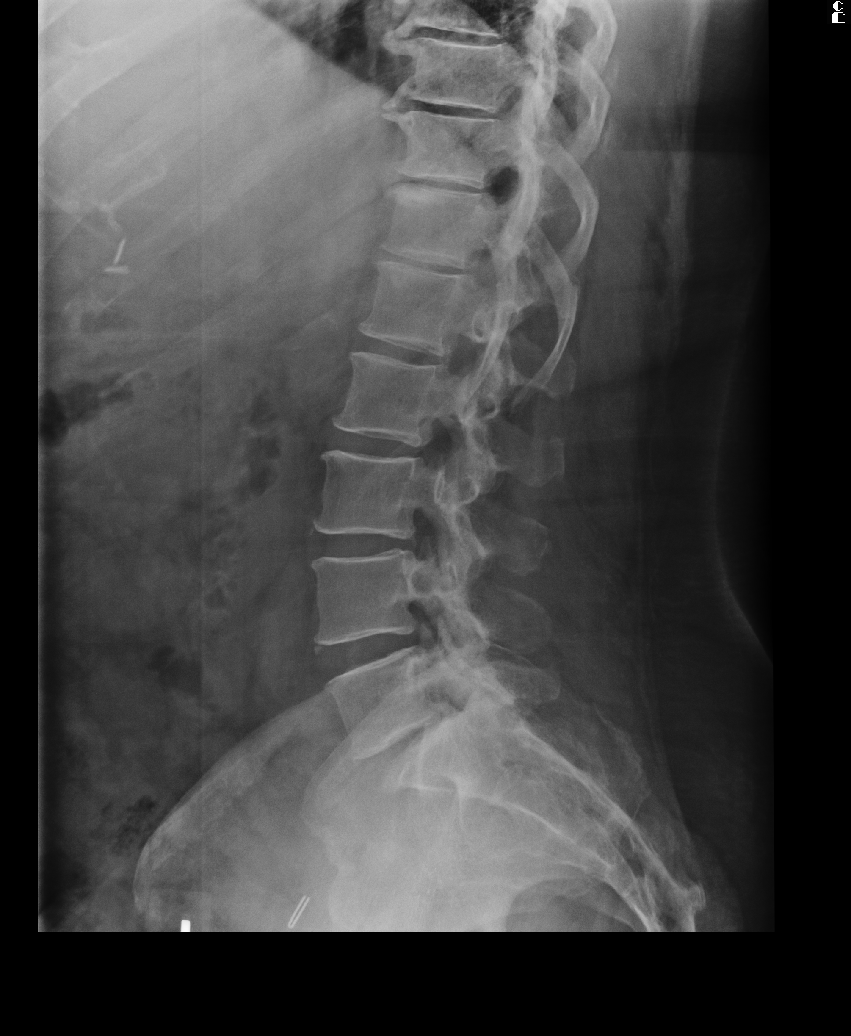
[im 5/5]
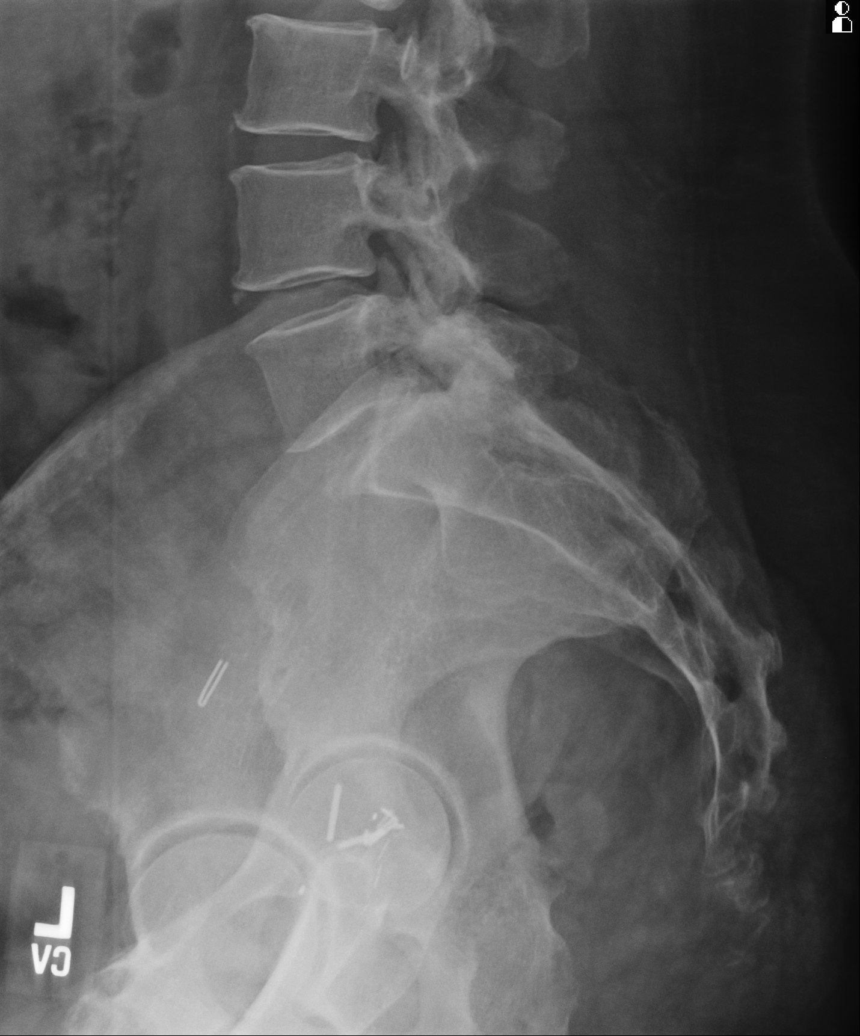

[5 of 5 positions shown; findings below may reference images not displayed]

PROCEDURE:     KDR - KDXR LUMBAR SPINE WITH OBLIQUES  - [DATE] [DATE]

RESULT:     The lumbar vertebral bodies are preserved in height. There is
degenerative facet joint change at L4-L5 and L5-S1. There is no
spondylolisthesis. The intervertebral disc space heights are reasonably well
maintained in the lumbar spine. The pedicles and transverse processes appear
intact. The observed portions of the sacrum are normal. There are
degenerative disc changes with large anterior osteophytes in the lower
thoracic spine.
IMPRESSION: There are mild degenerative facet joint changes at L4-L5
and L5-S1. There is no high-grade disc space narrowing or evidence of a
compression fracture.

[REDACTED]

## 2013-04-16 IMAGING — CR RIGHT HIP - COMPLETE 2+ VIEW
1 series · 2 of 2 positions shown · non-contrast
Comparison: none

REASON FOR EXAM: pain
COMMENTS:

[Series 1: ap · 0.17mm/px · 2 of 2 slices shown]
[im 1/2]
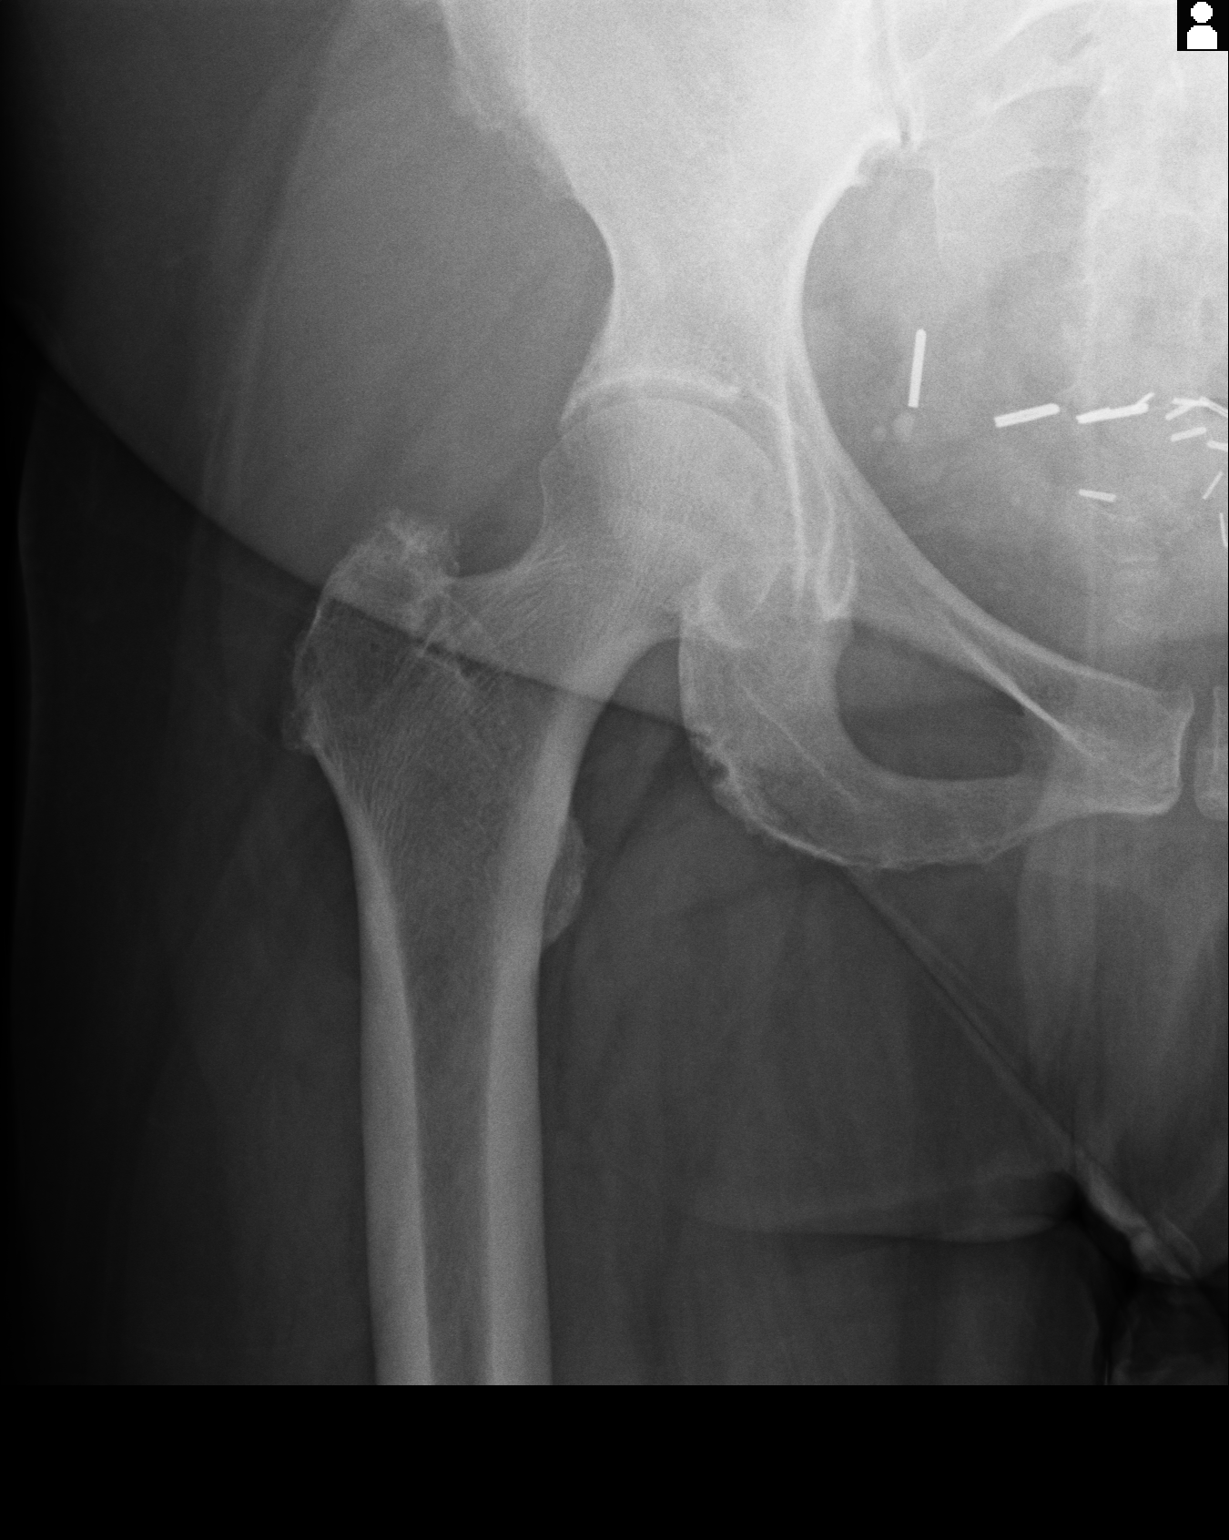
[im 2/2]
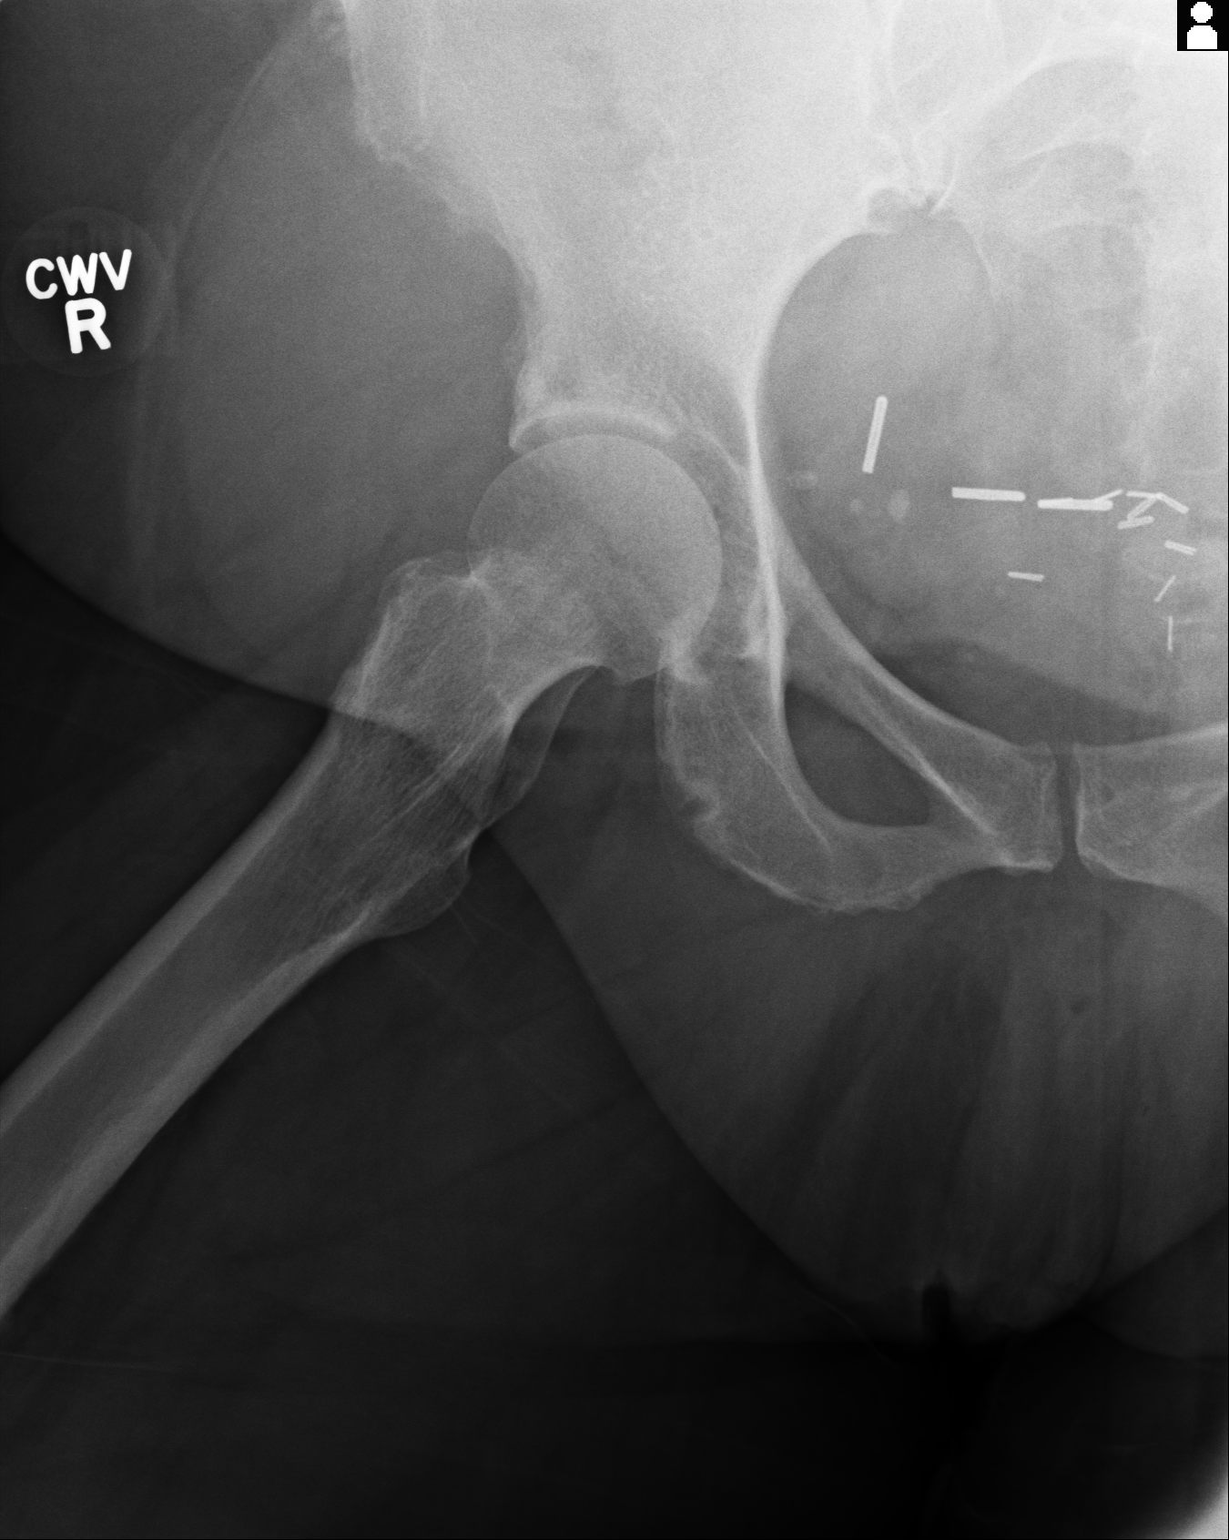

[2 of 2 positions shown; findings below may reference images not displayed]

PROCEDURE:     KDR - KDXR HIP RIGHT COMPLETE  - [DATE] [DATE]

RESULT:     AP and frog-leg lateral views of the right hip reveal the bones
to be adequately mineralized. The hip joint space is preserved. There is no
significant degenerative change demonstrated. There is no evidence of an
acute tractor.
IMPRESSION: There is no acute bony abnormality of the left hip nor
evidence of significant degenerative change.

[REDACTED]

## 2013-04-16 IMAGING — CR PELVIS - 1-2 VIEW
1 series · 1 of 1 positions shown · non-contrast
Comparison: none

REASON FOR EXAM: pain
COMMENTS:

[ap]
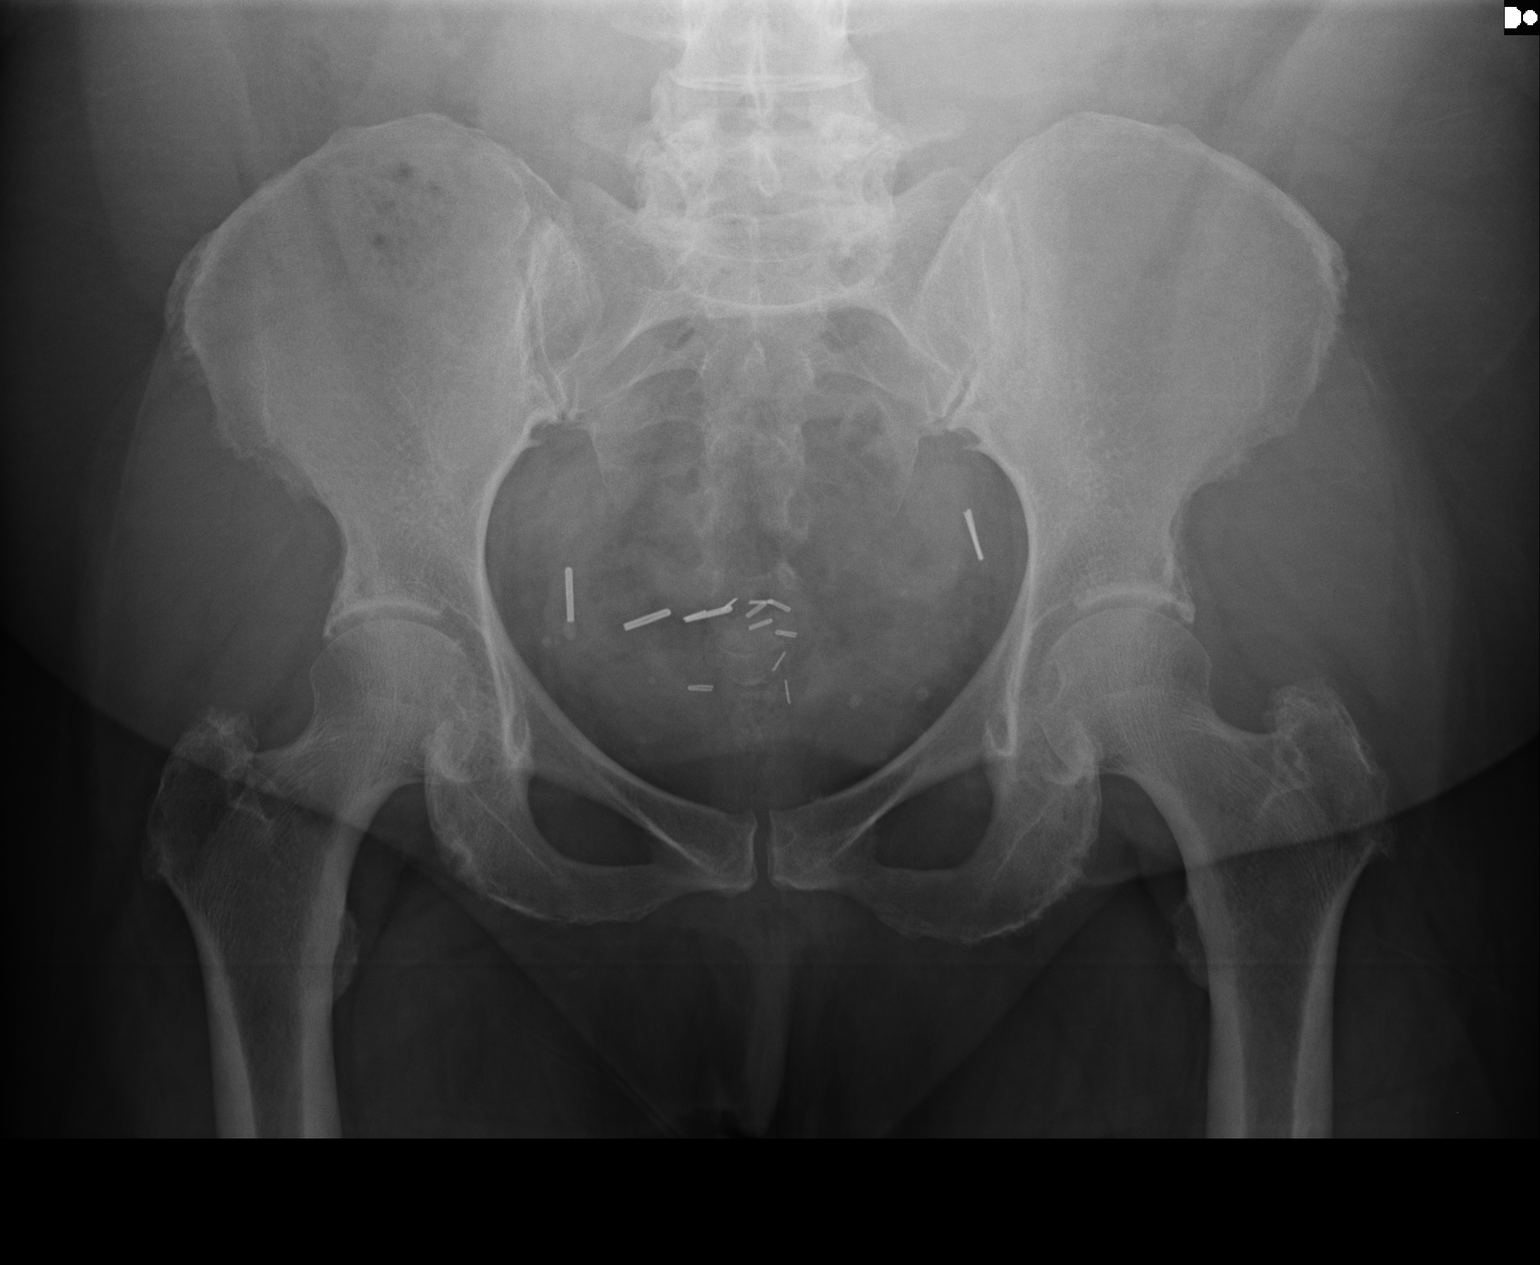

[1 of 1 positions shown; findings below may reference images not displayed]

PROCEDURE:     KDR - KDXR PELVIS AP ONLY  - [DATE] [DATE]

RESULT:     The bony pelvis is adequately mineralized. There is no evidence
of an acute fracture. The observed portions of the sacrum appear normal. The
SI joints exhibit no acute abnormality. The hips exhibit no significant
degenerative change. There are numerous surgical clips in the pelvis.
IMPRESSION: There is no acute bony abnormality of the pelvis.

[REDACTED]

## 2013-04-16 IMAGING — CR DG HIP COMPLETE 2+V*L*
1 series · 2 of 2 positions shown · non-contrast
Comparison: none

REASON FOR EXAM: pain
COMMENTS:

PROCEDURE:     KDR - KDXR HIP LEFT COMPLETE  - [DATE] [DATE]
RESULT:     AP and lateral views of the left hip reveal the bones to be
adequately mineralized. The hip joint space is preserved. There is no
evidence of an acute fracture.

[Series 1: ap · 0.17mm/px · 2 of 2 slices shown]
[im 1/2]
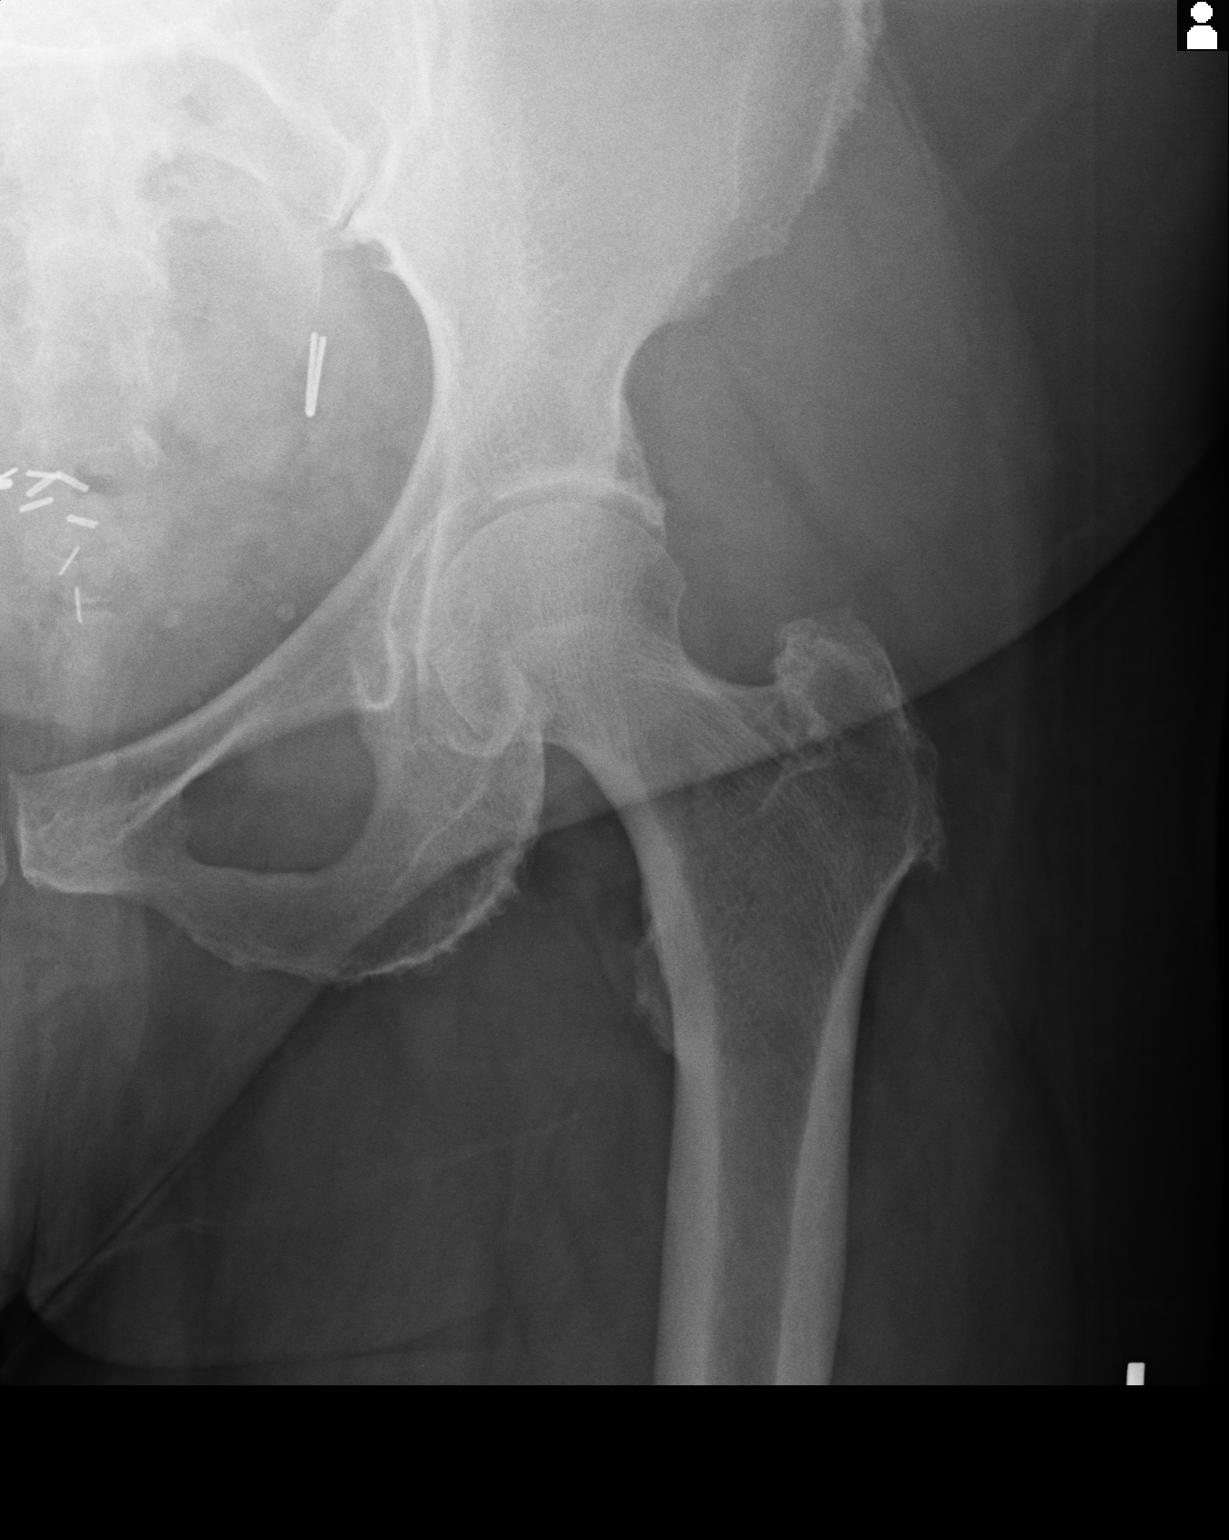
[im 2/2]
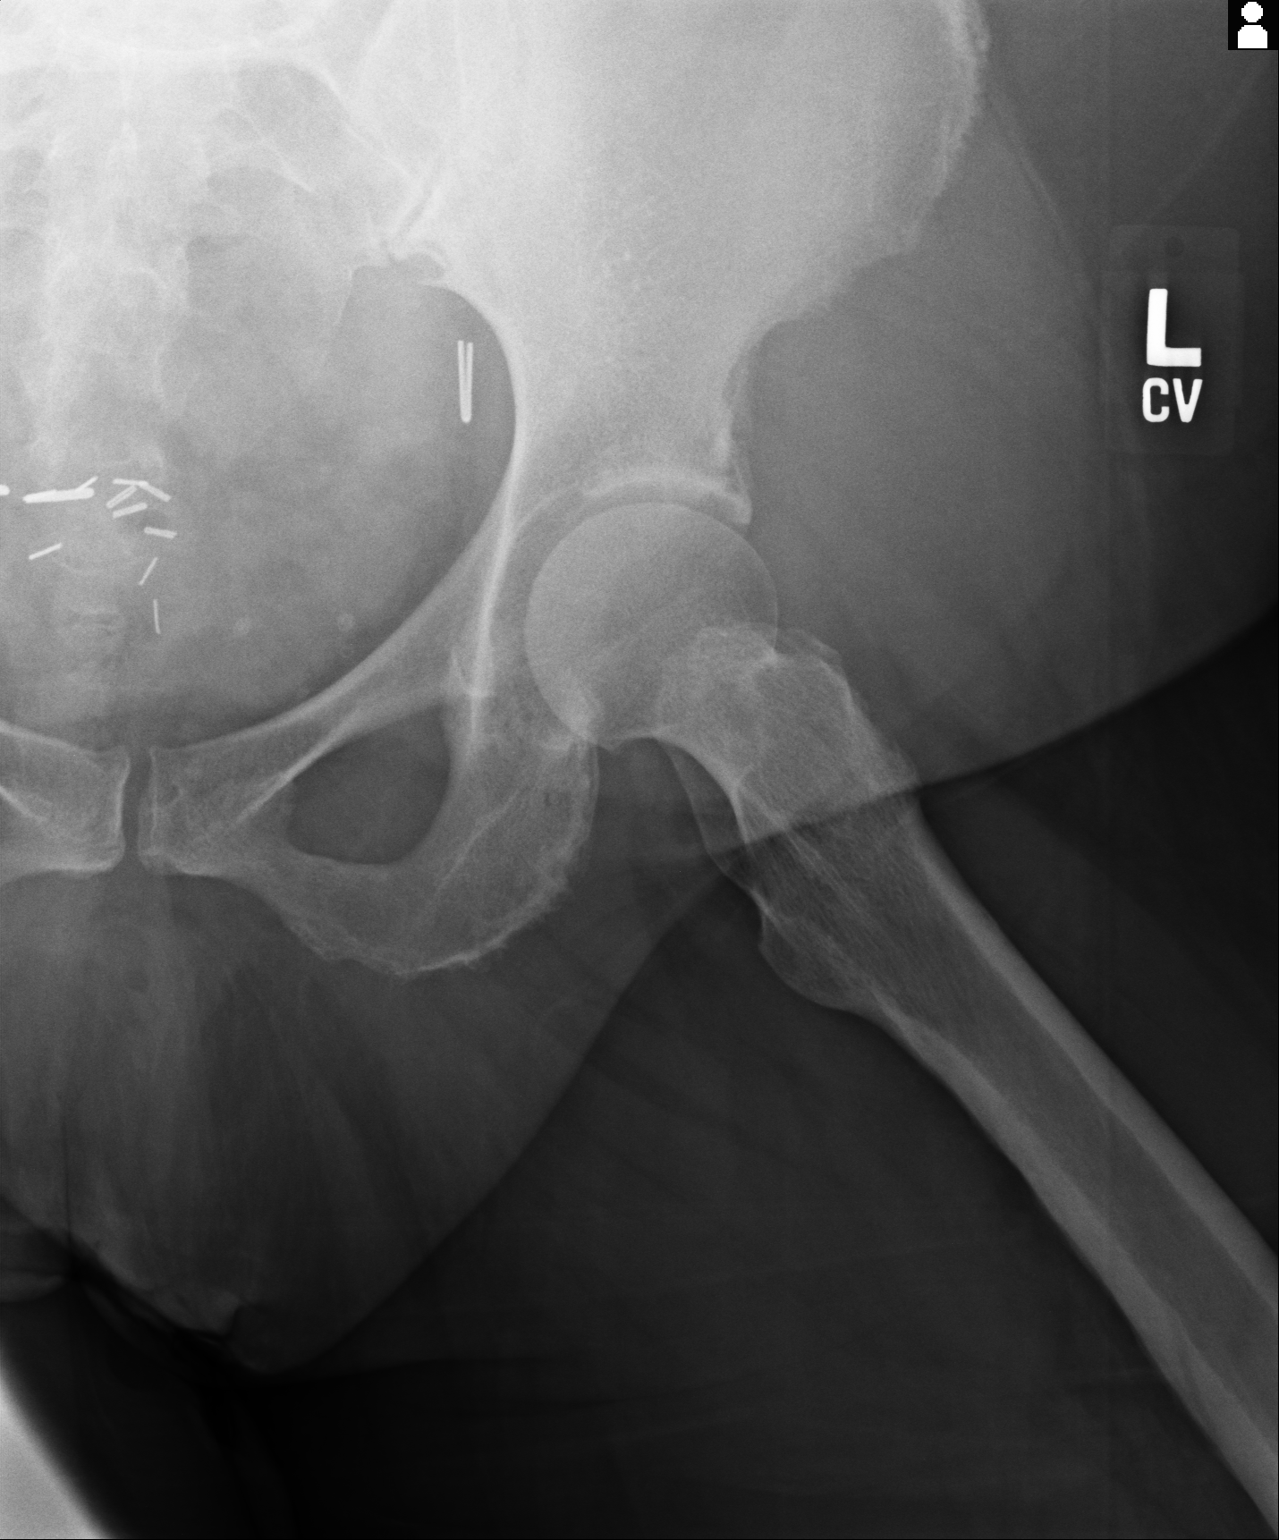

[2 of 2 positions shown; findings below may reference images not displayed]

IMPRESSION: There is no acute bony abnormality of the left hip nor
evidence of significant degenerative change.

[REDACTED]

## 2013-12-24 ENCOUNTER — Ambulatory Visit: Payer: Self-pay

## 2013-12-24 IMAGING — MG MM DIGITAL SCREENING BILAT W/ CAD
1 series · 5 of 5 positions shown · non-contrast
Comparison: Previous exam(s).

CLINICAL DATA: Screening.

EXAM:
DIGITAL SCREENING BILATERAL MAMMOGRAM WITH CAD

[R CC · right · 5 of 5 slices shown]
[im 1/5]
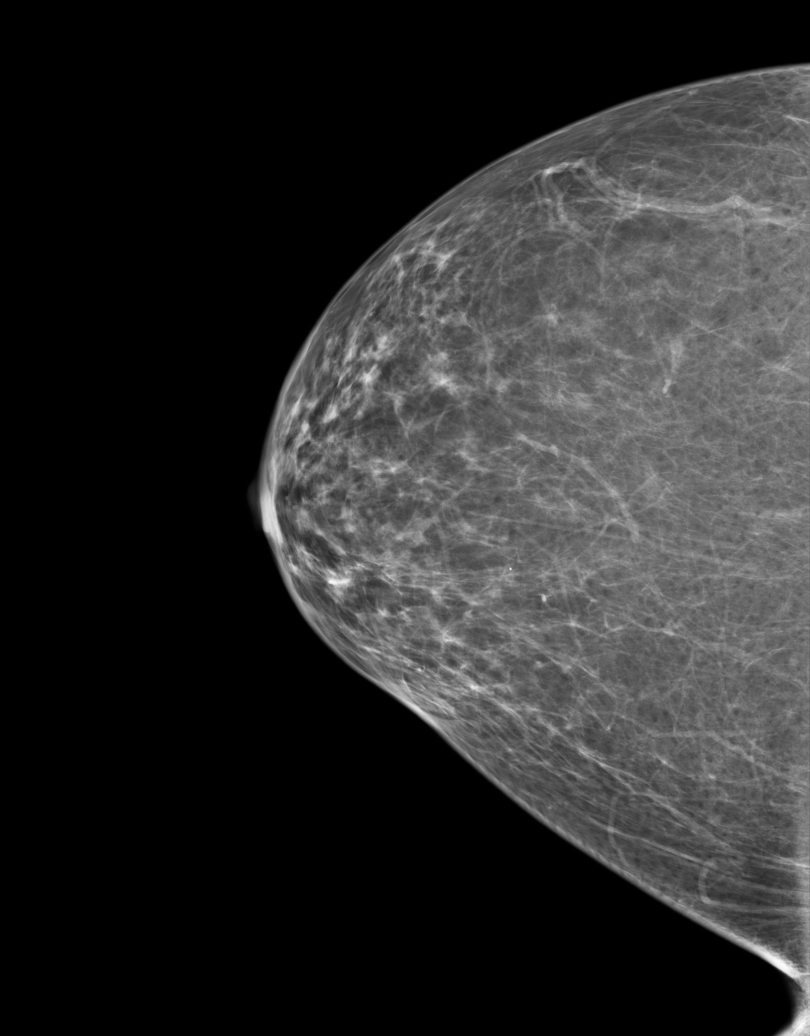
[im 2/5]
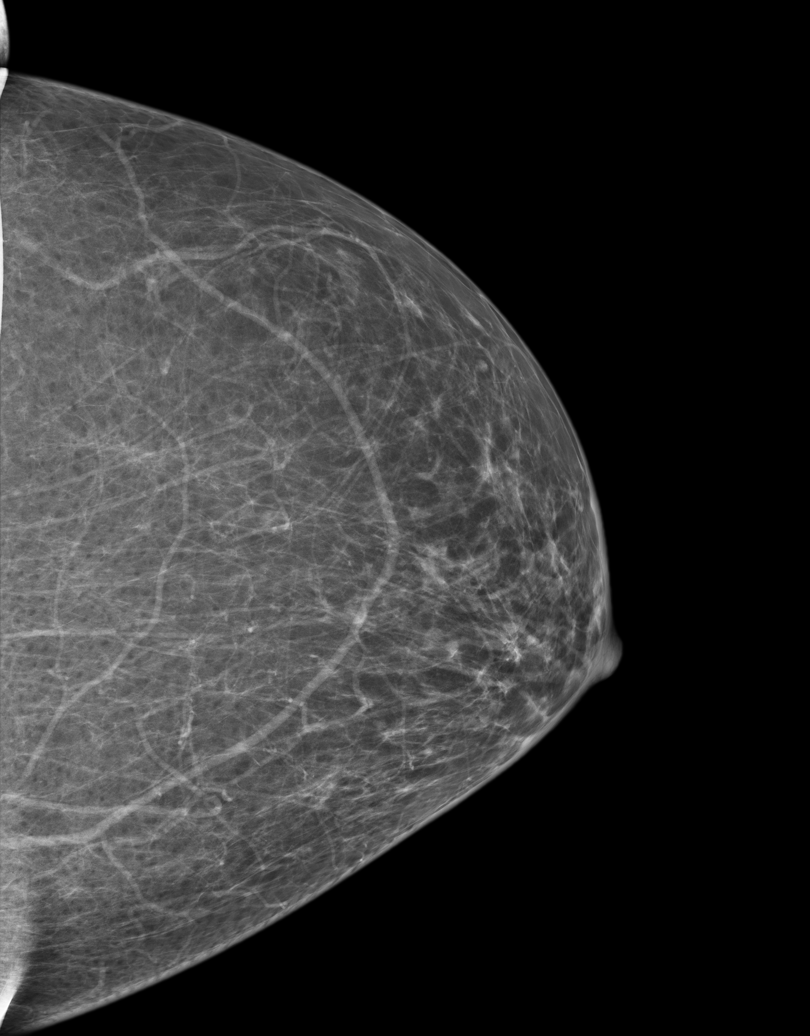
[im 3/5]
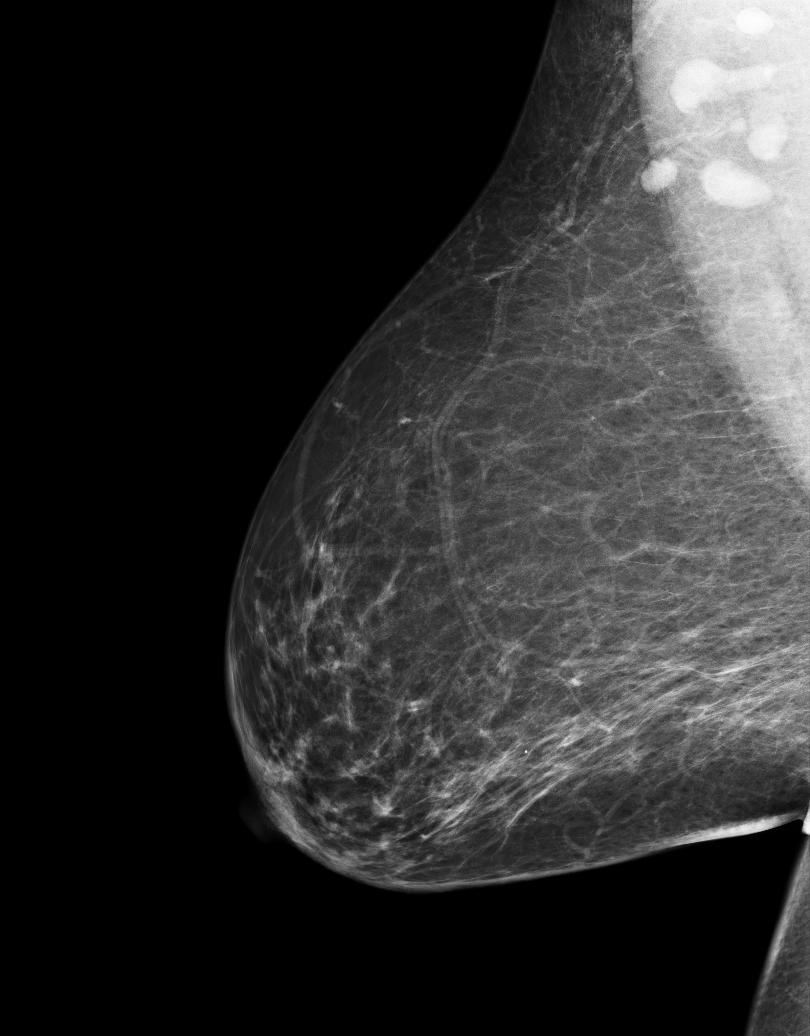
[im 4/5]
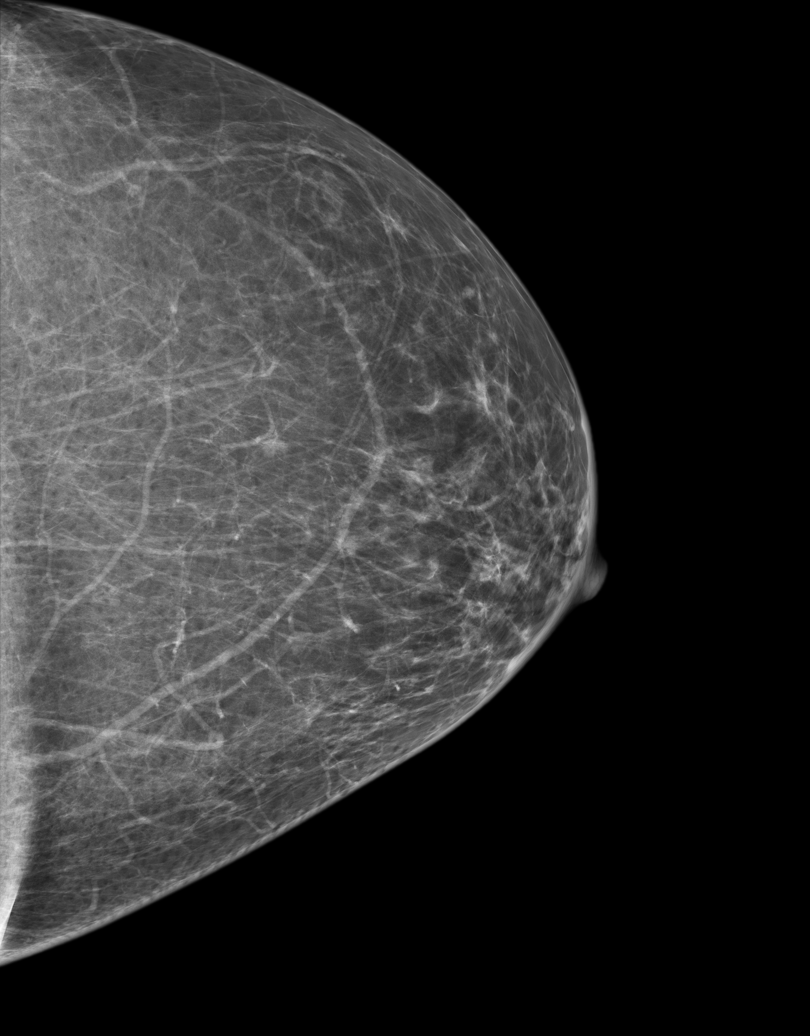
[im 5/5]
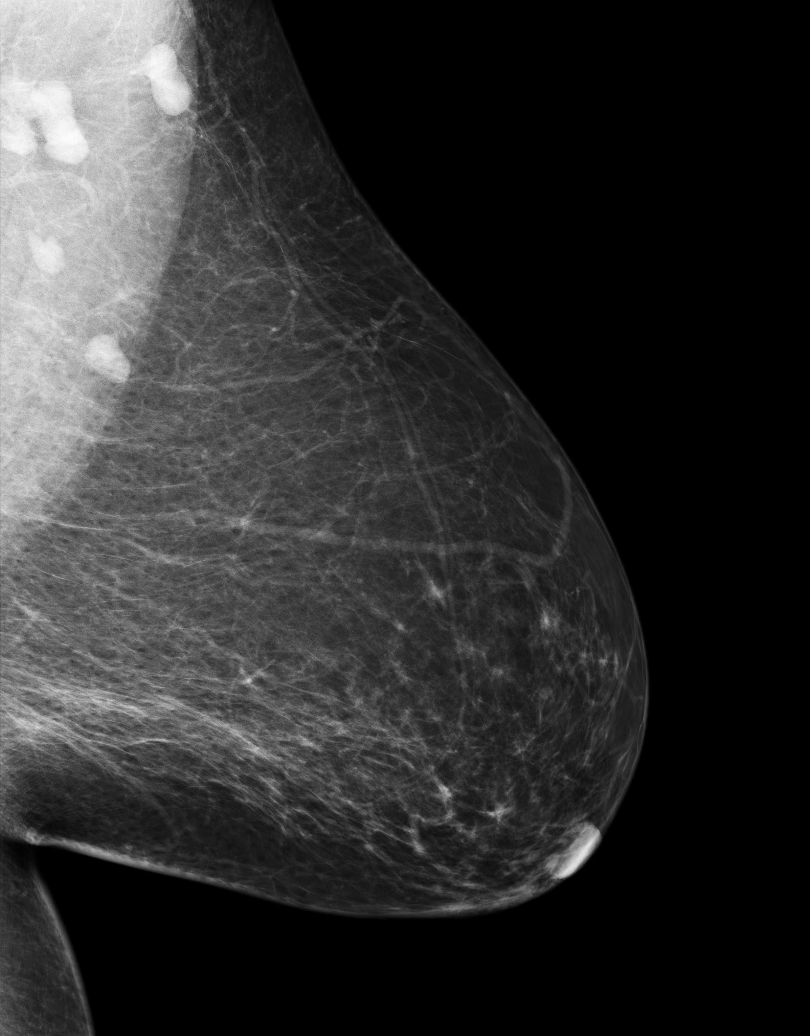

[5 of 5 positions shown; findings below may reference images not displayed]

ACR Breast Density Category b: There are scattered areas of
fibroglandular density.
FINDINGS: There are no findings suspicious for malignancy. Images were
processed with CAD.
IMPRESSION: No mammographic evidence of malignancy. A result letter of this
screening mammogram will be mailed directly to the patient.

RECOMMENDATION:
Screening mammogram in one year. (Code:[HN])

BI-RADS CATEGORY  1: Negative

## 2014-03-10 ENCOUNTER — Other Ambulatory Visit: Payer: Self-pay

## 2014-03-10 LAB — COMPREHENSIVE METABOLIC PANEL
ALK PHOS: 91 U/L
ALT: 52 U/L (ref 12–78)
Albumin: 3.4 g/dL (ref 3.4–5.0)
Anion Gap: 5 — ABNORMAL LOW (ref 7–16)
BILIRUBIN TOTAL: 0.4 mg/dL (ref 0.2–1.0)
BUN: 9 mg/dL (ref 7–18)
CO2: 33 mmol/L — AB (ref 21–32)
Calcium, Total: 9.5 mg/dL (ref 8.5–10.1)
Chloride: 98 mmol/L (ref 98–107)
Creatinine: 0.85 mg/dL (ref 0.60–1.30)
EGFR (African American): >60
EGFR (Non-African Amer.): >60
GLUCOSE: 195 mg/dL — AB (ref 65–99)
OSMOLALITY: 276 (ref 275–301)
POTASSIUM: 3.5 mmol/L (ref 3.5–5.1)
SGOT(AST): 33 U/L (ref 15–37)
SODIUM: 136 mmol/L (ref 136–145)
TOTAL PROTEIN: 7.7 g/dL (ref 6.4–8.2)

## 2014-03-10 LAB — CBC WITH DIFFERENTIAL/PLATELET
BASOS ABS: 0 10*3/uL (ref 0.0–0.1)
Basophil %: 0.4 %
EOS ABS: 0.1 10*3/uL (ref 0.0–0.7)
Eosinophil %: 1.5 %
HCT: 42.1 % (ref 35.0–47.0)
HGB: 13.4 g/dL (ref 12.0–16.0)
LYMPHS ABS: 3.5 10*3/uL (ref 1.0–3.6)
LYMPHS PCT: 34.6 %
MCH: 24.9 pg — ABNORMAL LOW (ref 26.0–34.0)
MCHC: 31.8 g/dL — ABNORMAL LOW (ref 32.0–36.0)
MCV: 78 fL — AB (ref 80–100)
MONO ABS: 0.5 x10 3/mm (ref 0.2–0.9)
Monocyte %: 5 %
NEUTROS ABS: 5.8 10*3/uL (ref 1.4–6.5)
Neutrophil %: 58.5 %
PLATELETS: 314 10*3/uL (ref 150–440)
RBC: 5.37 10*6/uL — ABNORMAL HIGH (ref 3.80–5.20)
RDW: 15.8 % — ABNORMAL HIGH (ref 11.5–14.5)
WBC: 10 10*3/uL (ref 3.6–11.0)

## 2014-03-10 LAB — LIPID PANEL
Cholesterol: 194 mg/dL (ref 0–200)
HDL: 62 mg/dL — AB (ref 40–60)
LDL CHOLESTEROL, CALC: 102 mg/dL — AB (ref 0–100)
TRIGLYCERIDES: 148 mg/dL (ref 0–200)
VLDL Cholesterol, Calc: 30 mg/dL (ref 5–40)

## 2014-03-10 LAB — TSH: THYROID STIMULATING HORM: 1.62 u[IU]/mL
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
## 2015-01-22 DIAGNOSIS — I10 Essential (primary) hypertension: Secondary | ICD-10-CM | POA: Diagnosis not present

## 2015-01-22 DIAGNOSIS — E1140 Type 2 diabetes mellitus with diabetic neuropathy, unspecified: Secondary | ICD-10-CM | POA: Diagnosis not present

## 2015-01-22 DIAGNOSIS — L509 Urticaria, unspecified: Secondary | ICD-10-CM | POA: Diagnosis not present

## 2015-01-22 DIAGNOSIS — J309 Allergic rhinitis, unspecified: Secondary | ICD-10-CM | POA: Diagnosis not present

## 2015-02-03 DIAGNOSIS — E1140 Type 2 diabetes mellitus with diabetic neuropathy, unspecified: Secondary | ICD-10-CM | POA: Diagnosis not present

## 2015-02-03 DIAGNOSIS — I10 Essential (primary) hypertension: Secondary | ICD-10-CM | POA: Diagnosis not present

## 2015-02-03 DIAGNOSIS — M150 Primary generalized (osteo)arthritis: Secondary | ICD-10-CM | POA: Diagnosis not present

## 2015-02-03 DIAGNOSIS — F330 Major depressive disorder, recurrent, mild: Secondary | ICD-10-CM | POA: Diagnosis not present

## 2015-02-03 DIAGNOSIS — G4712 Idiopathic hypersomnia without long sleep time: Secondary | ICD-10-CM | POA: Diagnosis not present

## 2015-02-04 DIAGNOSIS — Z1231 Encounter for screening mammogram for malignant neoplasm of breast: Secondary | ICD-10-CM | POA: Diagnosis not present

## 2015-02-10 DIAGNOSIS — E1140 Type 2 diabetes mellitus with diabetic neuropathy, unspecified: Secondary | ICD-10-CM | POA: Diagnosis not present

## 2015-02-10 DIAGNOSIS — D519 Vitamin B12 deficiency anemia, unspecified: Secondary | ICD-10-CM | POA: Diagnosis not present

## 2015-02-10 DIAGNOSIS — F330 Major depressive disorder, recurrent, mild: Secondary | ICD-10-CM | POA: Diagnosis not present

## 2015-02-10 DIAGNOSIS — M109 Gout, unspecified: Secondary | ICD-10-CM | POA: Diagnosis not present

## 2015-02-10 DIAGNOSIS — I10 Essential (primary) hypertension: Secondary | ICD-10-CM | POA: Diagnosis not present

## 2015-02-10 DIAGNOSIS — D518 Other vitamin B12 deficiency anemias: Secondary | ICD-10-CM | POA: Diagnosis not present

## 2015-03-23 DIAGNOSIS — E1136 Type 2 diabetes mellitus with diabetic cataract: Secondary | ICD-10-CM | POA: Diagnosis not present

## 2015-04-17 DIAGNOSIS — W06XXXA Fall from bed, initial encounter: Secondary | ICD-10-CM | POA: Diagnosis not present

## 2015-04-17 DIAGNOSIS — D519 Vitamin B12 deficiency anemia, unspecified: Secondary | ICD-10-CM | POA: Diagnosis not present

## 2015-04-17 DIAGNOSIS — E1140 Type 2 diabetes mellitus with diabetic neuropathy, unspecified: Secondary | ICD-10-CM | POA: Diagnosis not present

## 2015-04-17 DIAGNOSIS — M5440 Lumbago with sciatica, unspecified side: Secondary | ICD-10-CM | POA: Diagnosis not present

## 2015-04-17 DIAGNOSIS — G4710 Hypersomnia, unspecified: Secondary | ICD-10-CM | POA: Diagnosis not present

## 2015-04-21 ENCOUNTER — Other Ambulatory Visit: Payer: Self-pay | Admitting: Physician Assistant

## 2015-04-21 DIAGNOSIS — R42 Dizziness and giddiness: Secondary | ICD-10-CM

## 2015-04-24 ENCOUNTER — Ambulatory Visit
Admission: RE | Admit: 2015-04-24 | Discharge: 2015-04-24 | Disposition: A | Discharge status: Home / Self Care | Payer: Medicare Other | Source: Ambulatory Visit | Attending: Physician Assistant | Admitting: Physician Assistant

## 2015-04-24 DIAGNOSIS — I700 Atherosclerosis of aorta: Secondary | ICD-10-CM | POA: Diagnosis not present

## 2015-04-24 DIAGNOSIS — R42 Dizziness and giddiness: Secondary | ICD-10-CM | POA: Diagnosis not present

## 2015-04-24 IMAGING — CT CT HEAD W/O CM
1 series · 16 of 28 positions shown, 20 images · non-contrast
Comparison: None.

CLINICAL DATA: 68-year-old female with a history of dizziness

EXAM:
CT HEAD WITHOUT CONTRAST
TECHNIQUE: Contiguous axial images were obtained from the base of the skull
through the vertex without intravenous contrast.

[Series 2: head wo · axial · 0.39mm/px · z∈[-120,+5]mm · 16 of 28 slices shown, 20 images]
[im 2/28  brain]
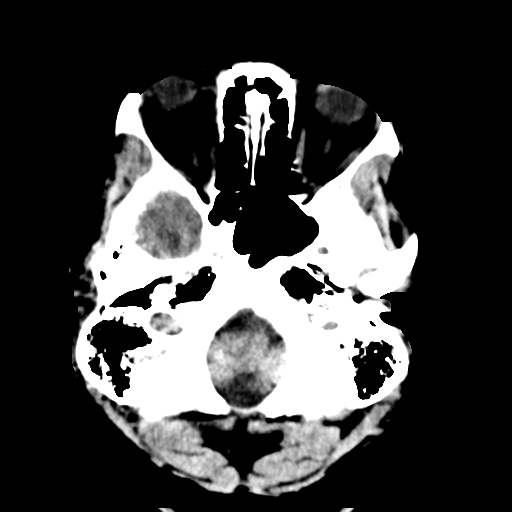
[im 2/28  bone]
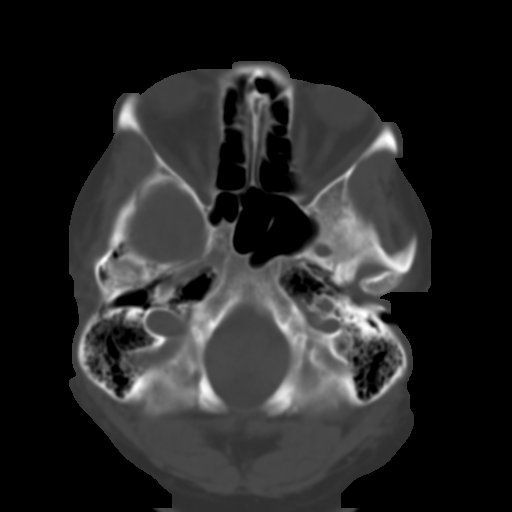
[im 4/28  brain]
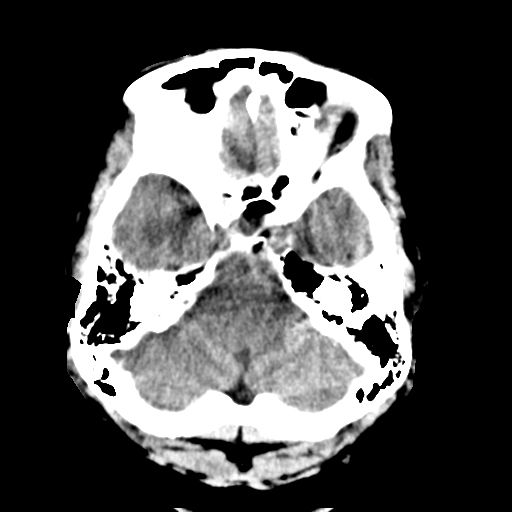
[im 6/28  brain]
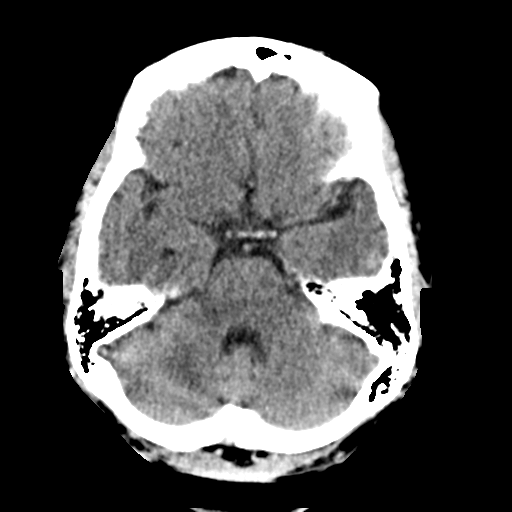
[im 7/28  brain]
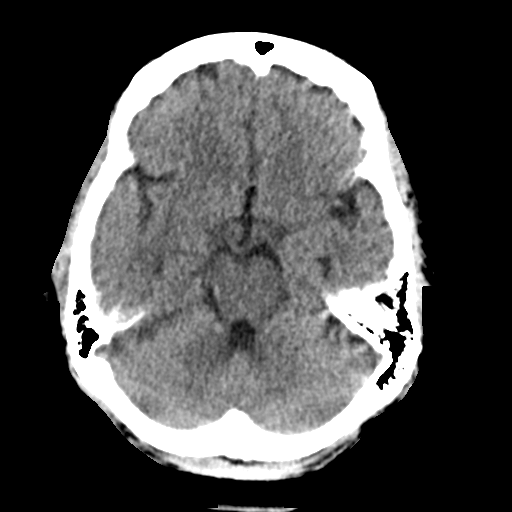
[im 9/28  brain]
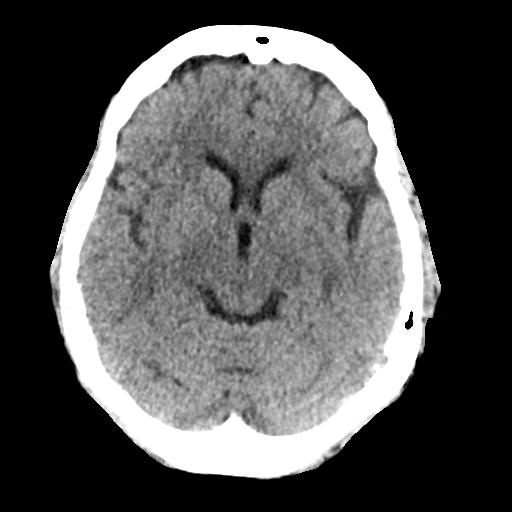
[im 9/28  bone]
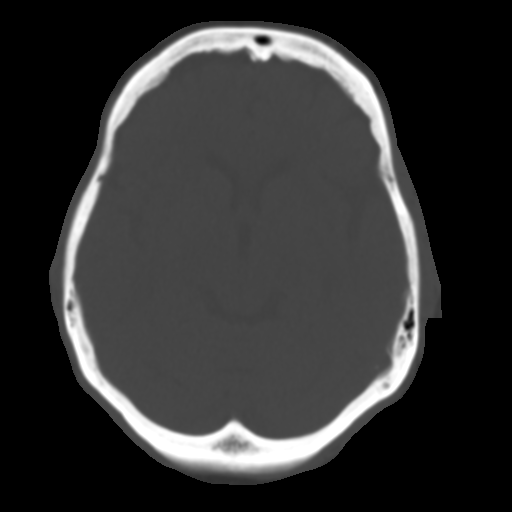
[im 10/28  brain]
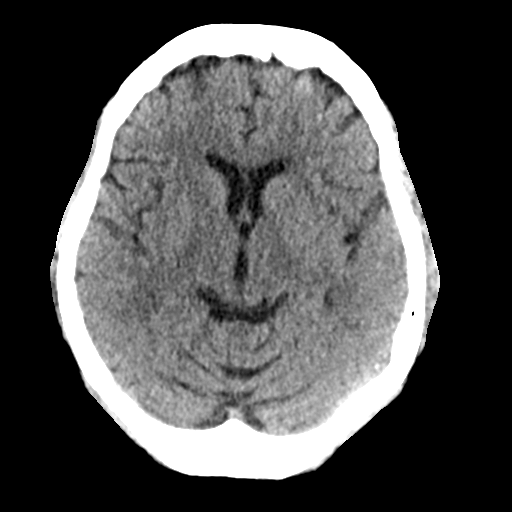
[im 12/28  brain]
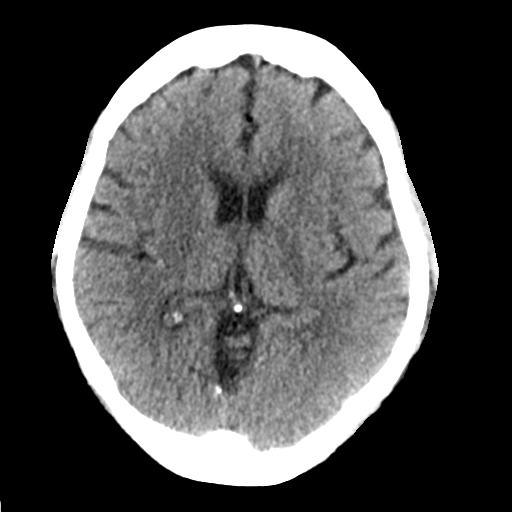
[im 14/28  brain]
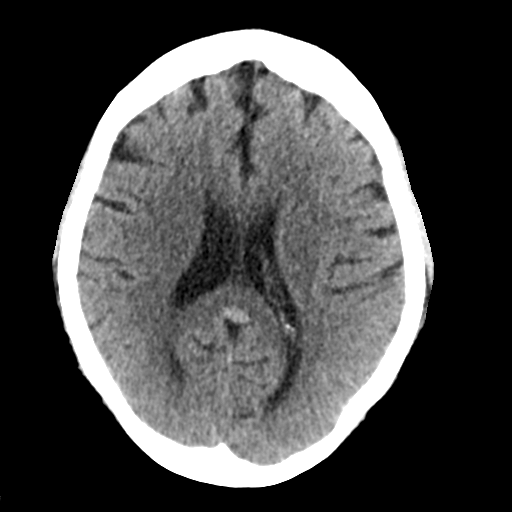
[im 15/28  brain]
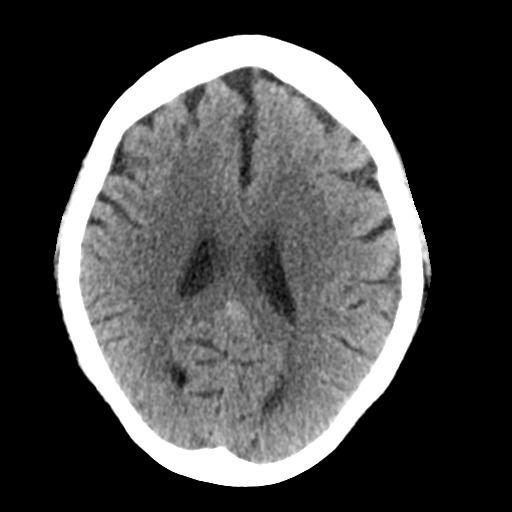
[im 15/28  bone]
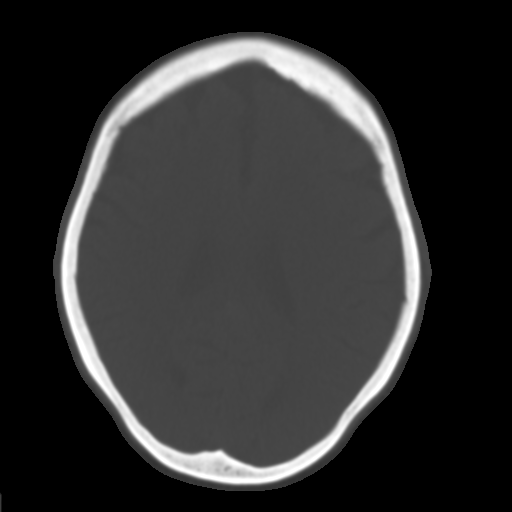
[im 17/28  brain]
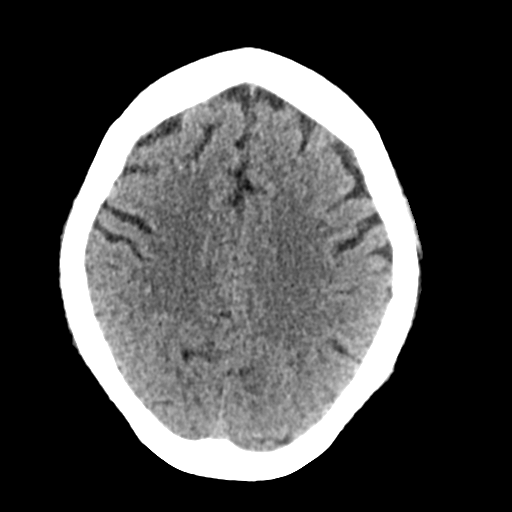
[im 19/28  brain]
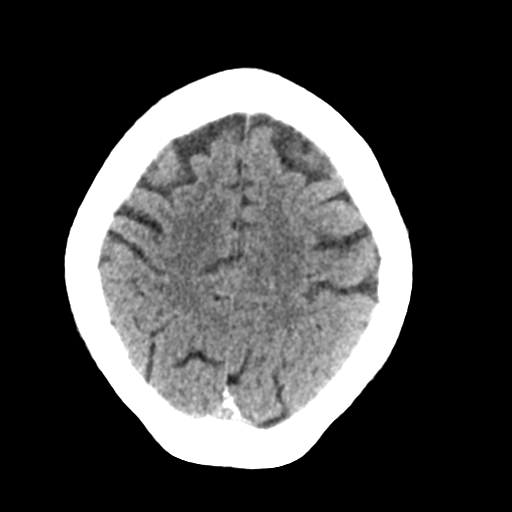
[im 20/28  brain]
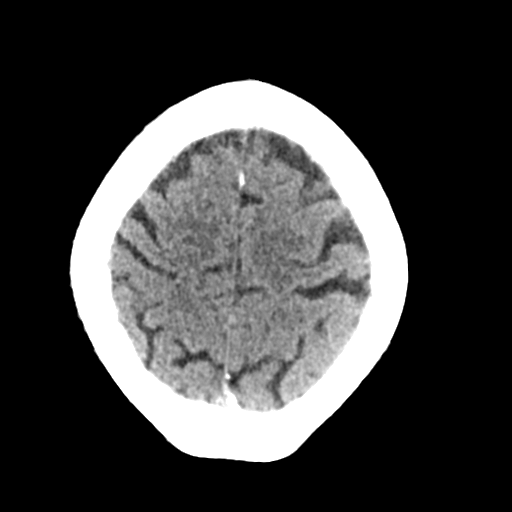
[im 22/28  brain]
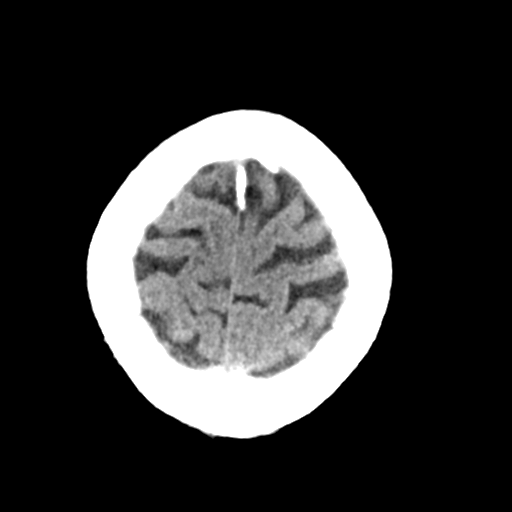
[im 22/28  bone]
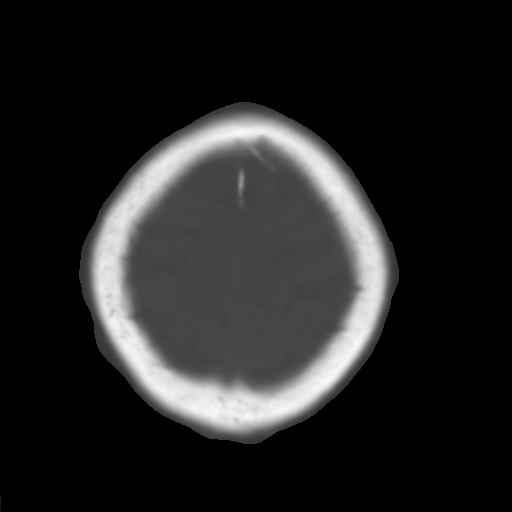
[im 23/28  brain]
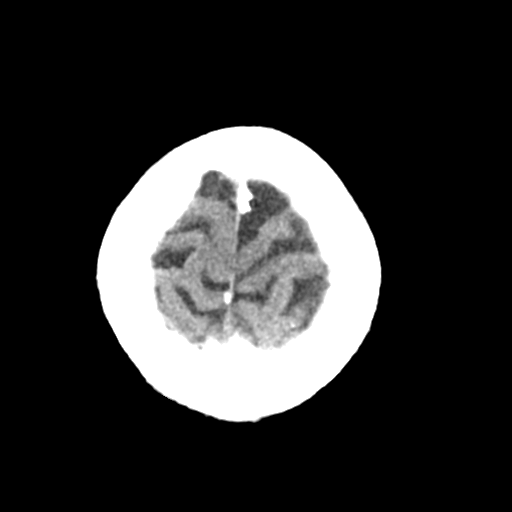
[im 25/28  brain]
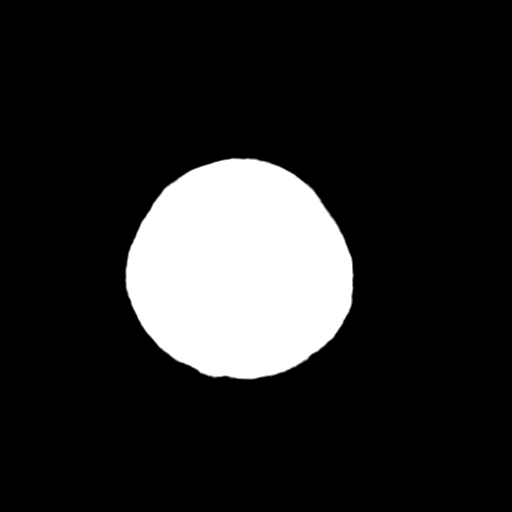
[im 27/28  brain]
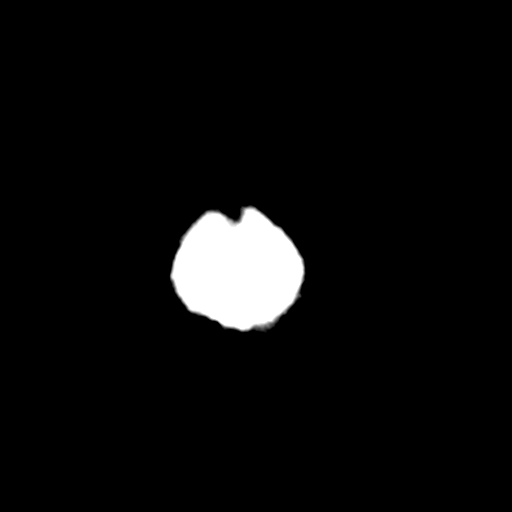

[16 of 28 positions shown; findings below may reference images not displayed]

FINDINGS: Unremarkable appearance of the calvarium without acute fracture or
aggressive lesion.

Unremarkable appearance of the scalp soft tissues.

Unremarkable appearance of the bilateral orbits.

Mastoid air cells are clear.

No significant paranasal sinus disease

No acute intracranial hemorrhage.  No midline shift or mass effect.

No significant brain volume loss. Atherosclerotic calcifications of
the anterior circulation.

Unremarkable appearance of the ventricular system.
IMPRESSION: No CT evidence of acute intracranial abnormality.

## 2015-06-04 DIAGNOSIS — F330 Major depressive disorder, recurrent, mild: Secondary | ICD-10-CM | POA: Diagnosis not present

## 2015-06-04 DIAGNOSIS — E1140 Type 2 diabetes mellitus with diabetic neuropathy, unspecified: Secondary | ICD-10-CM | POA: Diagnosis not present

## 2015-06-04 DIAGNOSIS — I10 Essential (primary) hypertension: Secondary | ICD-10-CM | POA: Diagnosis not present

## 2015-06-04 DIAGNOSIS — R300 Dysuria: Secondary | ICD-10-CM | POA: Diagnosis not present

## 2015-06-04 DIAGNOSIS — D519 Vitamin B12 deficiency anemia, unspecified: Secondary | ICD-10-CM | POA: Diagnosis not present

## 2015-06-04 DIAGNOSIS — Z0001 Encounter for general adult medical examination with abnormal findings: Secondary | ICD-10-CM | POA: Diagnosis not present

## 2015-06-04 DIAGNOSIS — G4710 Hypersomnia, unspecified: Secondary | ICD-10-CM | POA: Diagnosis not present

## 2015-09-03 ENCOUNTER — Other Ambulatory Visit: Payer: Self-pay | Admitting: Internal Medicine

## 2015-09-03 DIAGNOSIS — I10 Essential (primary) hypertension: Secondary | ICD-10-CM | POA: Diagnosis not present

## 2015-09-03 DIAGNOSIS — E1140 Type 2 diabetes mellitus with diabetic neuropathy, unspecified: Secondary | ICD-10-CM | POA: Diagnosis not present

## 2015-09-03 DIAGNOSIS — M79672 Pain in left foot: Secondary | ICD-10-CM

## 2015-09-07 ENCOUNTER — Ambulatory Visit
Admission: RE | Admit: 2015-09-07 | Discharge: 2015-09-07 | Disposition: A | Discharge status: Home / Self Care | Payer: Medicare Other | Source: Ambulatory Visit | Attending: Internal Medicine | Admitting: Internal Medicine

## 2015-09-07 DIAGNOSIS — M79672 Pain in left foot: Secondary | ICD-10-CM | POA: Diagnosis not present

## 2015-09-07 DIAGNOSIS — M7989 Other specified soft tissue disorders: Secondary | ICD-10-CM | POA: Diagnosis not present

## 2015-09-24 DIAGNOSIS — M109 Gout, unspecified: Secondary | ICD-10-CM | POA: Diagnosis not present

## 2015-09-24 DIAGNOSIS — E1140 Type 2 diabetes mellitus with diabetic neuropathy, unspecified: Secondary | ICD-10-CM | POA: Diagnosis not present

## 2015-09-24 DIAGNOSIS — I10 Essential (primary) hypertension: Secondary | ICD-10-CM | POA: Diagnosis not present

## 2015-09-24 DIAGNOSIS — Z23 Encounter for immunization: Secondary | ICD-10-CM | POA: Diagnosis not present

## 2015-09-24 DIAGNOSIS — F22 Delusional disorders: Secondary | ICD-10-CM | POA: Diagnosis not present

## 2015-09-24 DIAGNOSIS — M5440 Lumbago with sciatica, unspecified side: Secondary | ICD-10-CM | POA: Diagnosis not present

## 2015-10-26 DIAGNOSIS — D519 Vitamin B12 deficiency anemia, unspecified: Secondary | ICD-10-CM | POA: Diagnosis not present

## 2015-10-26 DIAGNOSIS — I10 Essential (primary) hypertension: Secondary | ICD-10-CM | POA: Diagnosis not present

## 2015-10-26 DIAGNOSIS — F22 Delusional disorders: Secondary | ICD-10-CM | POA: Diagnosis not present

## 2015-10-26 DIAGNOSIS — E1140 Type 2 diabetes mellitus with diabetic neuropathy, unspecified: Secondary | ICD-10-CM | POA: Diagnosis not present

## 2015-11-25 DIAGNOSIS — E1140 Type 2 diabetes mellitus with diabetic neuropathy, unspecified: Secondary | ICD-10-CM | POA: Diagnosis not present

## 2015-11-25 DIAGNOSIS — I10 Essential (primary) hypertension: Secondary | ICD-10-CM | POA: Diagnosis not present

## 2015-11-25 DIAGNOSIS — Z9114 Patient's other noncompliance with medication regimen: Secondary | ICD-10-CM | POA: Diagnosis not present

## 2015-11-26 DIAGNOSIS — I10 Essential (primary) hypertension: Secondary | ICD-10-CM | POA: Diagnosis not present

## 2015-11-26 DIAGNOSIS — R443 Hallucinations, unspecified: Secondary | ICD-10-CM | POA: Diagnosis not present

## 2015-11-26 DIAGNOSIS — J309 Allergic rhinitis, unspecified: Secondary | ICD-10-CM | POA: Diagnosis not present

## 2015-11-26 DIAGNOSIS — E1140 Type 2 diabetes mellitus with diabetic neuropathy, unspecified: Secondary | ICD-10-CM | POA: Diagnosis not present

## 2015-11-26 DIAGNOSIS — F330 Major depressive disorder, recurrent, mild: Secondary | ICD-10-CM | POA: Diagnosis not present

## 2015-12-01 DIAGNOSIS — E1140 Type 2 diabetes mellitus with diabetic neuropathy, unspecified: Secondary | ICD-10-CM | POA: Diagnosis not present

## 2015-12-01 DIAGNOSIS — Z9114 Patient's other noncompliance with medication regimen: Secondary | ICD-10-CM | POA: Diagnosis not present

## 2015-12-01 DIAGNOSIS — I10 Essential (primary) hypertension: Secondary | ICD-10-CM | POA: Diagnosis not present

## 2015-12-22 DIAGNOSIS — Z9114 Patient's other noncompliance with medication regimen: Secondary | ICD-10-CM | POA: Diagnosis not present

## 2015-12-22 DIAGNOSIS — E1140 Type 2 diabetes mellitus with diabetic neuropathy, unspecified: Secondary | ICD-10-CM | POA: Diagnosis not present

## 2015-12-22 DIAGNOSIS — I10 Essential (primary) hypertension: Secondary | ICD-10-CM | POA: Diagnosis not present

## 2016-01-21 DIAGNOSIS — S46811A Strain of other muscles, fascia and tendons at shoulder and upper arm level, right arm, initial encounter: Secondary | ICD-10-CM | POA: Diagnosis not present

## 2016-04-25 DIAGNOSIS — E119 Type 2 diabetes mellitus without complications: Secondary | ICD-10-CM | POA: Diagnosis not present

## 2016-04-26 DIAGNOSIS — M150 Primary generalized (osteo)arthritis: Secondary | ICD-10-CM | POA: Diagnosis not present

## 2016-04-26 DIAGNOSIS — E1140 Type 2 diabetes mellitus with diabetic neuropathy, unspecified: Secondary | ICD-10-CM | POA: Diagnosis not present

## 2016-04-26 DIAGNOSIS — I10 Essential (primary) hypertension: Secondary | ICD-10-CM | POA: Diagnosis not present

## 2016-04-26 DIAGNOSIS — E1040 Type 1 diabetes mellitus with diabetic neuropathy, unspecified: Secondary | ICD-10-CM | POA: Diagnosis not present

## 2016-04-26 DIAGNOSIS — Z0001 Encounter for general adult medical examination with abnormal findings: Secondary | ICD-10-CM | POA: Diagnosis not present

## 2016-04-26 DIAGNOSIS — K219 Gastro-esophageal reflux disease without esophagitis: Secondary | ICD-10-CM | POA: Diagnosis not present

## 2016-05-03 ENCOUNTER — Other Ambulatory Visit: Payer: Self-pay | Admitting: Internal Medicine

## 2016-05-03 DIAGNOSIS — Z1231 Encounter for screening mammogram for malignant neoplasm of breast: Secondary | ICD-10-CM

## 2016-05-11 ENCOUNTER — Ambulatory Visit: Payer: Medicare Other | Attending: Internal Medicine

## 2016-05-13 ENCOUNTER — Ambulatory Visit: Payer: Self-pay | Admitting: Licensed Clinical Social Worker

## 2016-05-27 ENCOUNTER — Ambulatory Visit: Payer: Self-pay | Admitting: Licensed Clinical Social Worker

## 2016-06-27 ENCOUNTER — Ambulatory Visit
Admission: RE | Admit: 2016-06-27 | Discharge: 2016-06-27 | Disposition: A | Discharge status: Home / Self Care | Payer: Medicare Other | Source: Ambulatory Visit | Attending: Internal Medicine | Admitting: Internal Medicine

## 2016-06-27 DIAGNOSIS — Z1231 Encounter for screening mammogram for malignant neoplasm of breast: Secondary | ICD-10-CM | POA: Diagnosis not present

## 2016-06-27 IMAGING — MG MM DIGITAL SCREENING BILAT W/ CAD
8 of 14 series · 8 of 30 positions shown · non-contrast
Comparison: Previous exam(s).

CLINICAL DATA: Screening.

EXAM:
DIGITAL SCREENING BILATERAL MAMMOGRAM WITH CAD

[L XCCL]
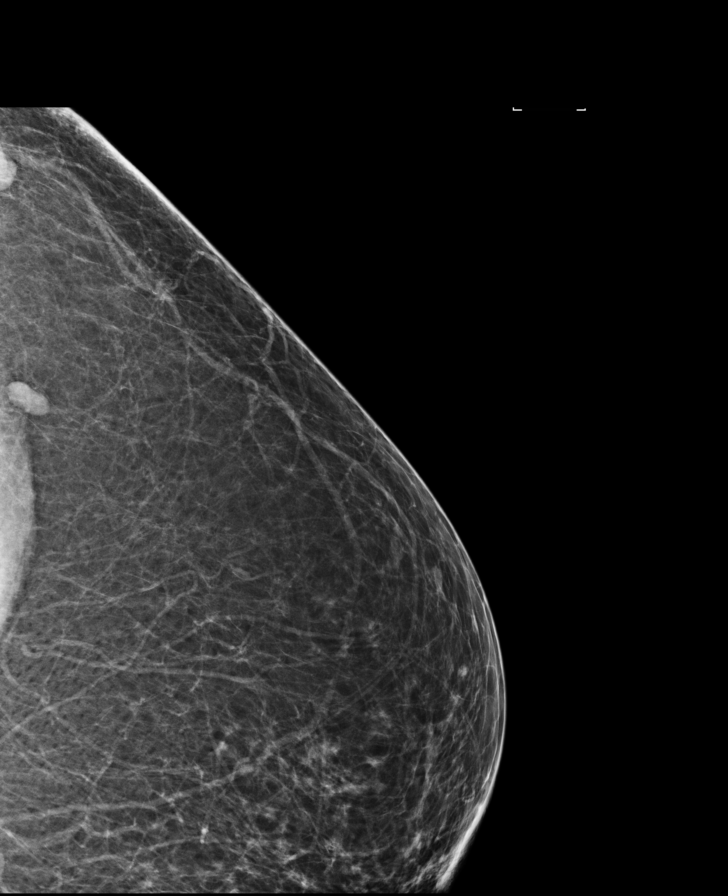

[L MLO (1 of 2)]
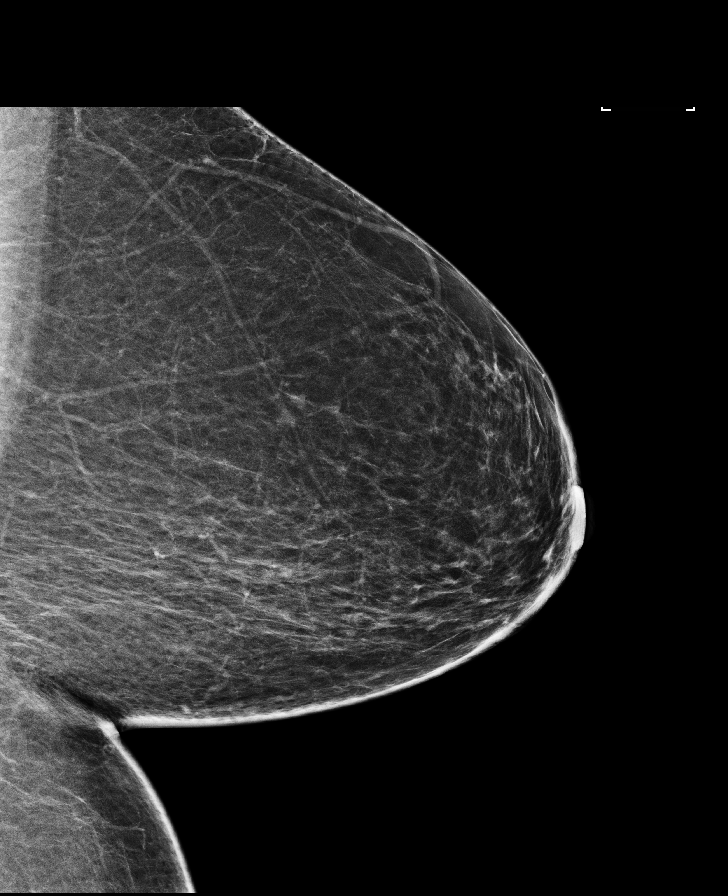

[L CC]
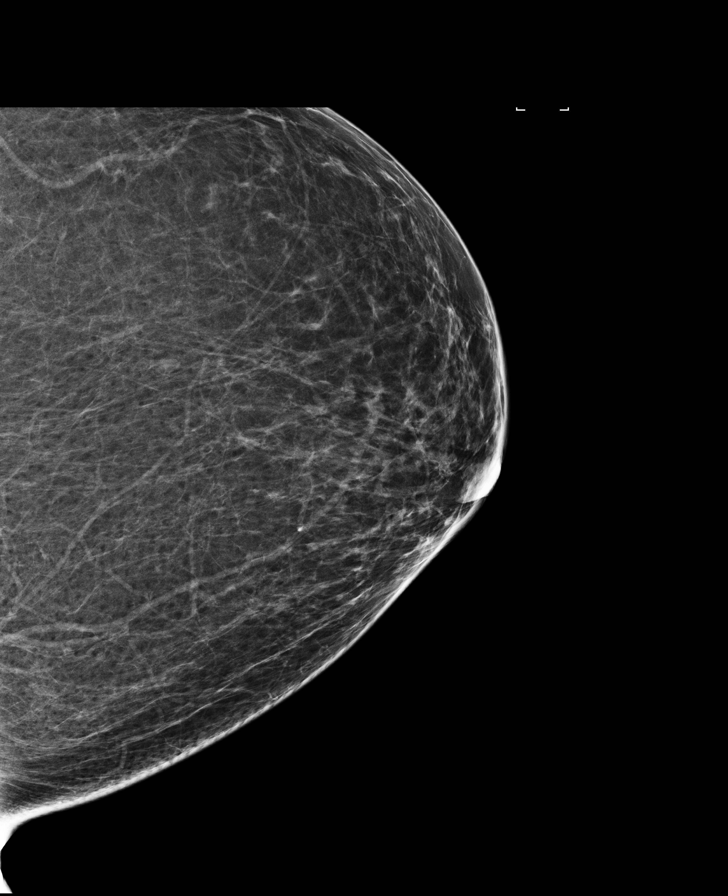

[R CC synth-2D]
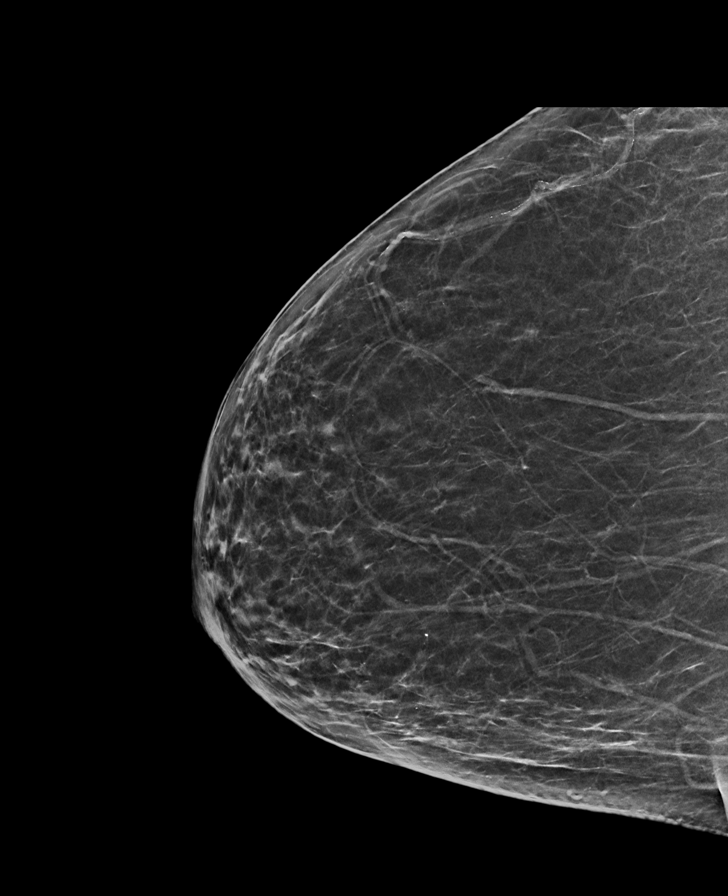

[L MLO synth-2D]
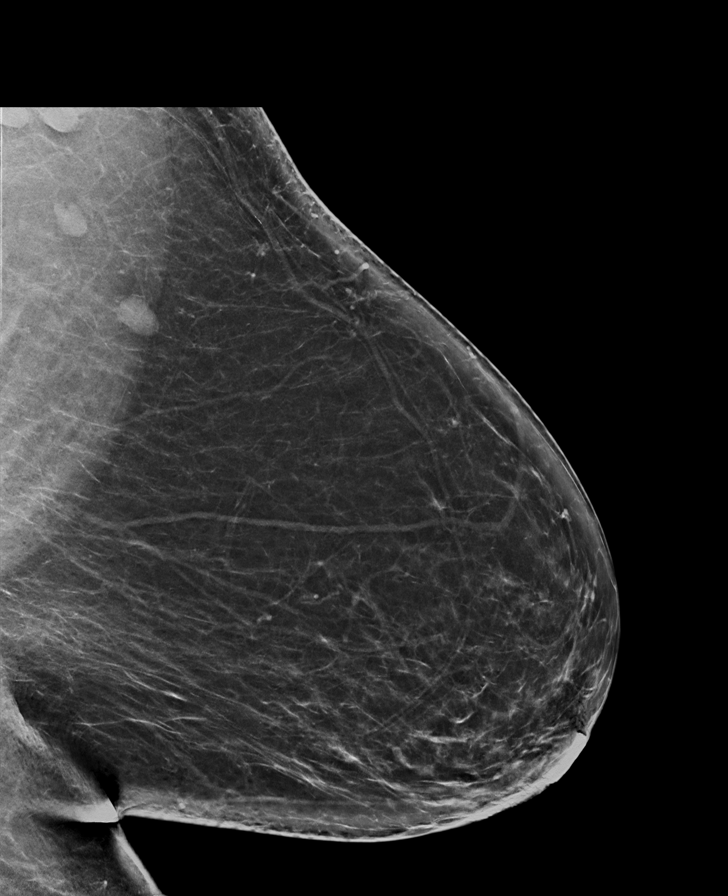

[L MLO (2 of 2)]
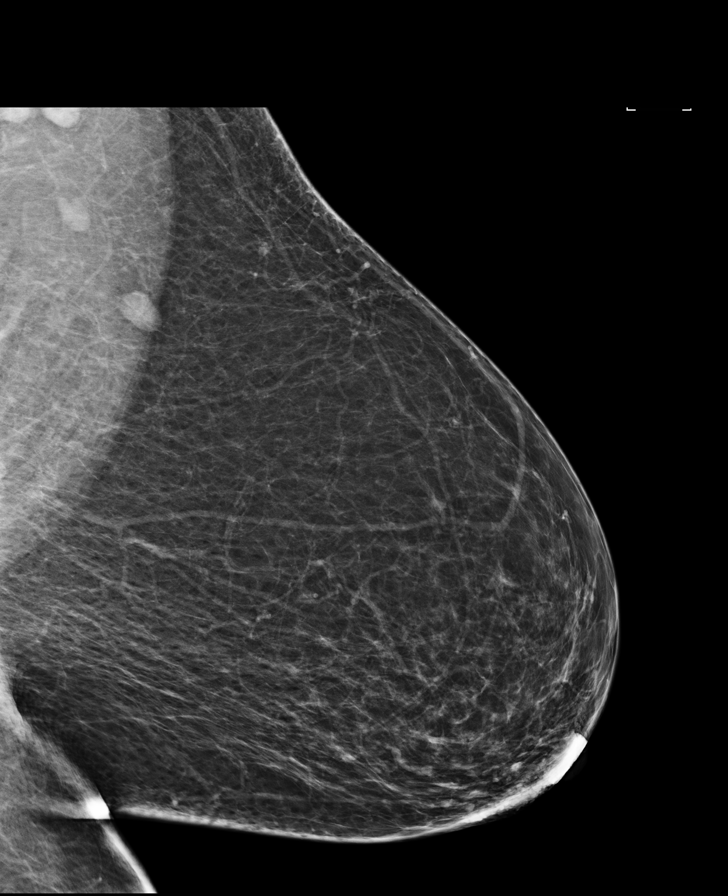

[L CC synth-2D]
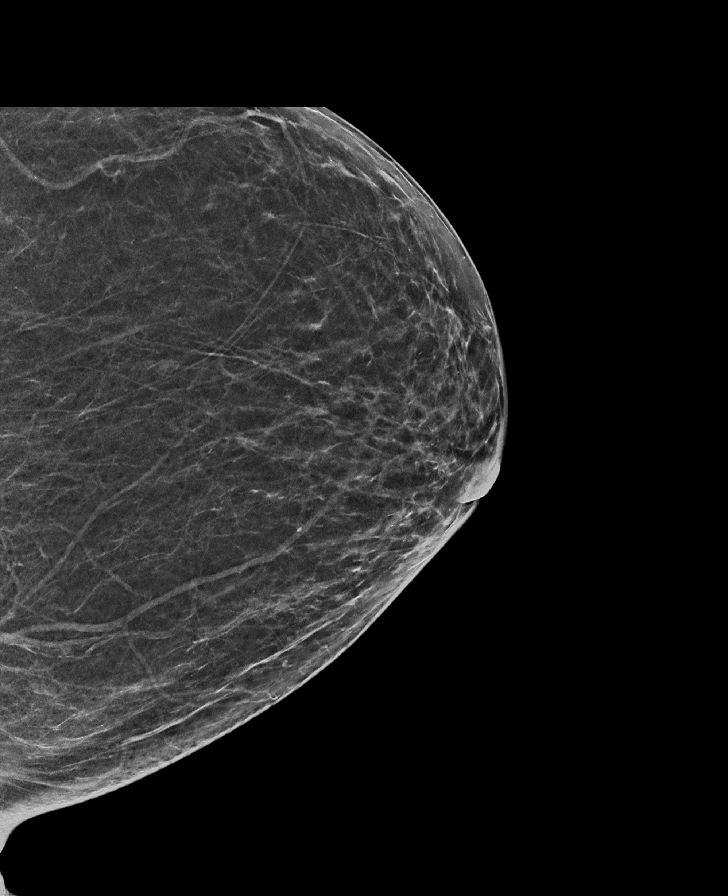

[R MLO synth-2D]
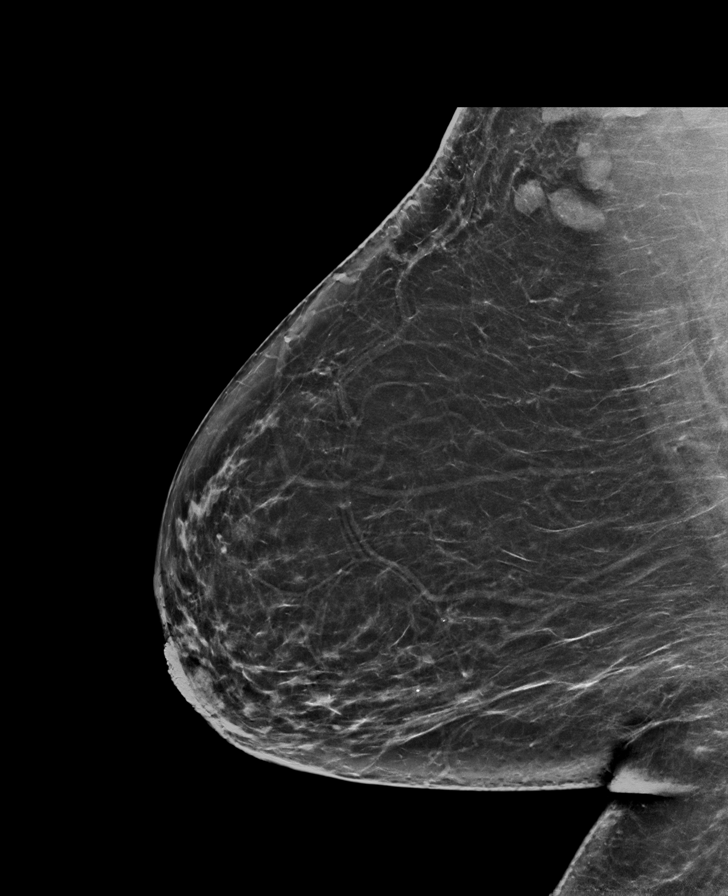

[8 of 30 positions shown; findings below may reference images not displayed]

ACR Breast Density Category b: There are scattered areas of
fibroglandular density.
FINDINGS: There are no findings suspicious for malignancy. Images were
processed with CAD.
IMPRESSION: No mammographic evidence of malignancy. A result letter of this
screening mammogram will be mailed directly to the patient.

RECOMMENDATION:
Screening mammogram in one year. (Code:[US])

BI-RADS CATEGORY  1: Negative.

## 2016-07-19 DIAGNOSIS — E785 Hyperlipidemia, unspecified: Secondary | ICD-10-CM | POA: Diagnosis not present

## 2016-07-19 DIAGNOSIS — Z1389 Encounter for screening for other disorder: Secondary | ICD-10-CM | POA: Diagnosis not present

## 2016-07-19 DIAGNOSIS — I10 Essential (primary) hypertension: Secondary | ICD-10-CM | POA: Diagnosis not present

## 2016-07-19 DIAGNOSIS — E119 Type 2 diabetes mellitus without complications: Secondary | ICD-10-CM | POA: Diagnosis not present

## 2016-07-19 DIAGNOSIS — R7309 Other abnormal glucose: Secondary | ICD-10-CM | POA: Diagnosis not present

## 2016-08-18 DIAGNOSIS — R21 Rash and other nonspecific skin eruption: Secondary | ICD-10-CM | POA: Diagnosis not present

## 2016-08-18 DIAGNOSIS — Z789 Other specified health status: Secondary | ICD-10-CM | POA: Diagnosis not present

## 2016-08-18 DIAGNOSIS — Z23 Encounter for immunization: Secondary | ICD-10-CM | POA: Diagnosis not present

## 2016-08-18 DIAGNOSIS — E119 Type 2 diabetes mellitus without complications: Secondary | ICD-10-CM | POA: Diagnosis not present

## 2016-08-18 DIAGNOSIS — R7309 Other abnormal glucose: Secondary | ICD-10-CM | POA: Diagnosis not present

## 2016-09-07 DIAGNOSIS — Z23 Encounter for immunization: Secondary | ICD-10-CM | POA: Diagnosis not present

## 2016-09-07 DIAGNOSIS — Z0000 Encounter for general adult medical examination without abnormal findings: Secondary | ICD-10-CM | POA: Diagnosis not present

## 2016-09-07 DIAGNOSIS — Z1389 Encounter for screening for other disorder: Secondary | ICD-10-CM | POA: Diagnosis not present

## 2016-09-07 DIAGNOSIS — J0110 Acute frontal sinusitis, unspecified: Secondary | ICD-10-CM | POA: Diagnosis not present

## 2016-09-07 DIAGNOSIS — R7309 Other abnormal glucose: Secondary | ICD-10-CM | POA: Diagnosis not present

## 2016-10-11 DIAGNOSIS — E119 Type 2 diabetes mellitus without complications: Secondary | ICD-10-CM | POA: Diagnosis not present

## 2016-10-11 DIAGNOSIS — Z9114 Patient's other noncompliance with medication regimen: Secondary | ICD-10-CM | POA: Diagnosis not present

## 2016-10-11 DIAGNOSIS — G629 Polyneuropathy, unspecified: Secondary | ICD-10-CM | POA: Diagnosis not present

## 2016-12-02 DIAGNOSIS — I10 Essential (primary) hypertension: Secondary | ICD-10-CM | POA: Diagnosis not present

## 2016-12-02 DIAGNOSIS — M25559 Pain in unspecified hip: Secondary | ICD-10-CM | POA: Diagnosis not present

## 2016-12-02 DIAGNOSIS — E119 Type 2 diabetes mellitus without complications: Secondary | ICD-10-CM | POA: Diagnosis not present

## 2016-12-06 ENCOUNTER — Other Ambulatory Visit: Payer: Self-pay | Admitting: Nurse Practitioner

## 2016-12-06 DIAGNOSIS — Z13820 Encounter for screening for osteoporosis: Secondary | ICD-10-CM

## 2017-01-18 ENCOUNTER — Ambulatory Visit: Admission: RE | Admit: 2017-01-18 | Payer: Medicare Other | Source: Ambulatory Visit

## 2017-02-15 DIAGNOSIS — R1084 Generalized abdominal pain: Secondary | ICD-10-CM | POA: Diagnosis not present

## 2017-02-15 DIAGNOSIS — R112 Nausea with vomiting, unspecified: Secondary | ICD-10-CM | POA: Diagnosis not present

## 2017-02-24 DIAGNOSIS — Z9181 History of falling: Secondary | ICD-10-CM | POA: Diagnosis not present

## 2017-02-24 DIAGNOSIS — E119 Type 2 diabetes mellitus without complications: Secondary | ICD-10-CM | POA: Diagnosis not present

## 2017-02-24 DIAGNOSIS — J45909 Unspecified asthma, uncomplicated: Secondary | ICD-10-CM | POA: Diagnosis not present

## 2017-02-24 DIAGNOSIS — R531 Weakness: Secondary | ICD-10-CM | POA: Diagnosis not present

## 2017-02-24 DIAGNOSIS — I10 Essential (primary) hypertension: Secondary | ICD-10-CM | POA: Diagnosis not present

## 2017-03-06 ENCOUNTER — Ambulatory Visit
Admission: RE | Admit: 2017-03-06 | Discharge: 2017-03-06 | Disposition: A | Discharge status: Home / Self Care | Payer: Medicare Other | Source: Ambulatory Visit | Attending: Nurse Practitioner | Admitting: Nurse Practitioner

## 2017-03-06 DIAGNOSIS — Z13820 Encounter for screening for osteoporosis: Secondary | ICD-10-CM | POA: Insufficient documentation

## 2017-04-19 DIAGNOSIS — E785 Hyperlipidemia, unspecified: Secondary | ICD-10-CM | POA: Diagnosis not present

## 2017-04-19 DIAGNOSIS — I10 Essential (primary) hypertension: Secondary | ICD-10-CM | POA: Diagnosis not present

## 2017-04-19 DIAGNOSIS — G629 Polyneuropathy, unspecified: Secondary | ICD-10-CM | POA: Diagnosis not present

## 2017-04-19 DIAGNOSIS — E119 Type 2 diabetes mellitus without complications: Secondary | ICD-10-CM | POA: Diagnosis not present

## 2017-05-02 DIAGNOSIS — R590 Localized enlarged lymph nodes: Secondary | ICD-10-CM | POA: Diagnosis not present

## 2017-05-31 DIAGNOSIS — H524 Presbyopia: Secondary | ICD-10-CM | POA: Diagnosis not present

## 2017-05-31 DIAGNOSIS — E119 Type 2 diabetes mellitus without complications: Secondary | ICD-10-CM | POA: Diagnosis not present

## 2017-07-03 DIAGNOSIS — H60502 Unspecified acute noninfective otitis externa, left ear: Secondary | ICD-10-CM | POA: Diagnosis not present

## 2017-09-19 ENCOUNTER — Other Ambulatory Visit: Payer: Self-pay | Admitting: Family Medicine

## 2017-09-19 DIAGNOSIS — Z1231 Encounter for screening mammogram for malignant neoplasm of breast: Secondary | ICD-10-CM

## 2017-09-20 ENCOUNTER — Ambulatory Visit
Admission: RE | Admit: 2017-09-20 | Discharge: 2017-09-20 | Disposition: A | Discharge status: Home / Self Care | Payer: Medicare Other | Source: Ambulatory Visit | Attending: Family Medicine | Admitting: Family Medicine

## 2017-09-20 DIAGNOSIS — Z1231 Encounter for screening mammogram for malignant neoplasm of breast: Secondary | ICD-10-CM | POA: Insufficient documentation

## 2017-09-20 IMAGING — MG MM DIGITAL SCREENING BILAT W/ CAD
4 series · 4 of 4 positions shown · non-contrast
Comparison: Previous exam(s).

CLINICAL DATA: Screening.

EXAM:
DIGITAL SCREENING BILATERAL MAMMOGRAM WITH CAD

[R MLO]
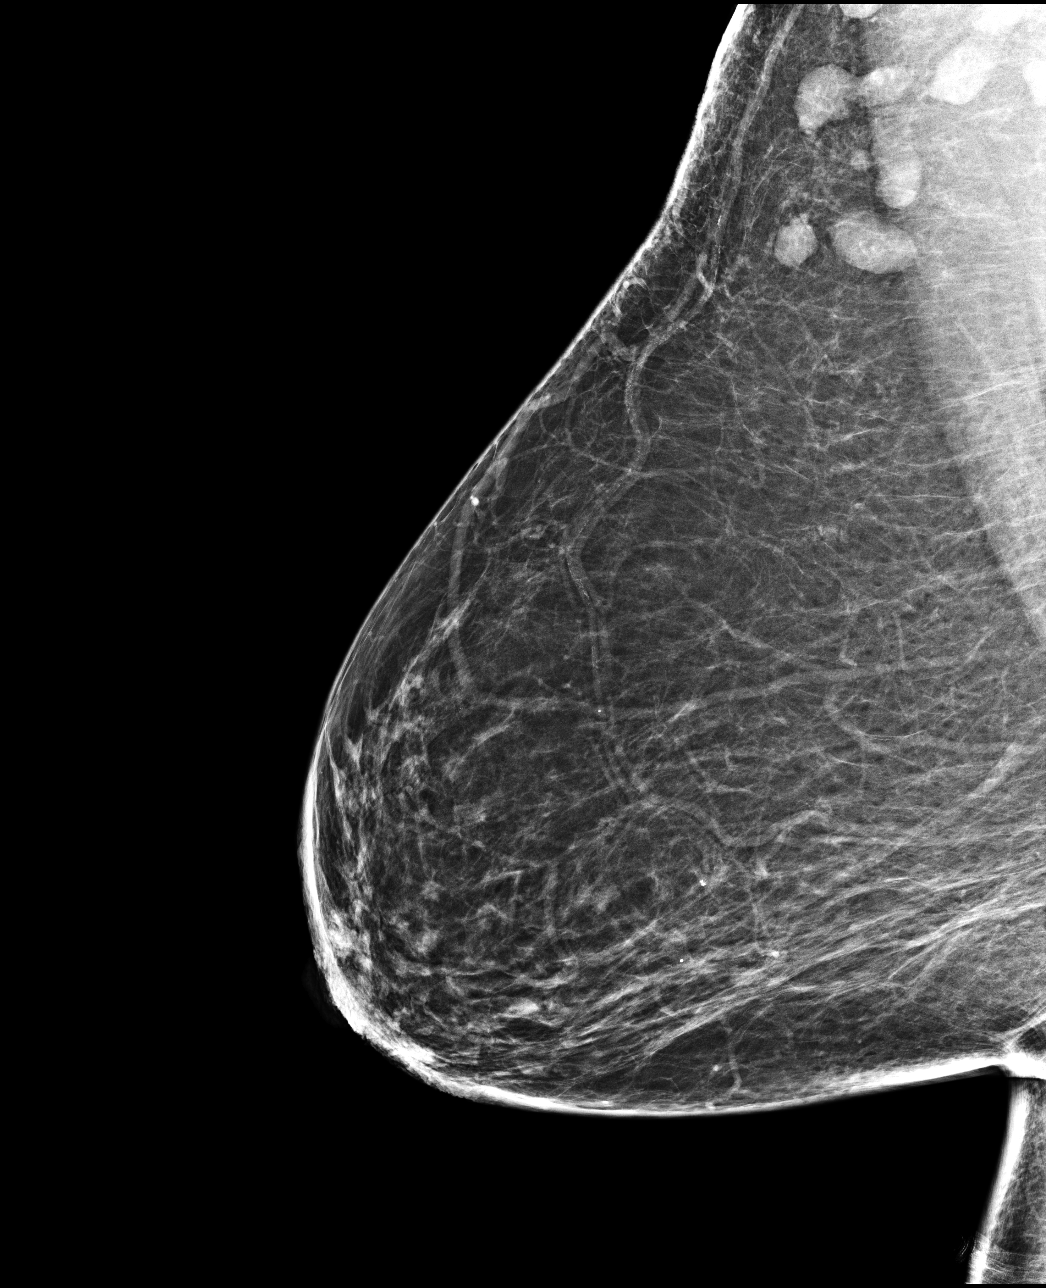

[R CC]
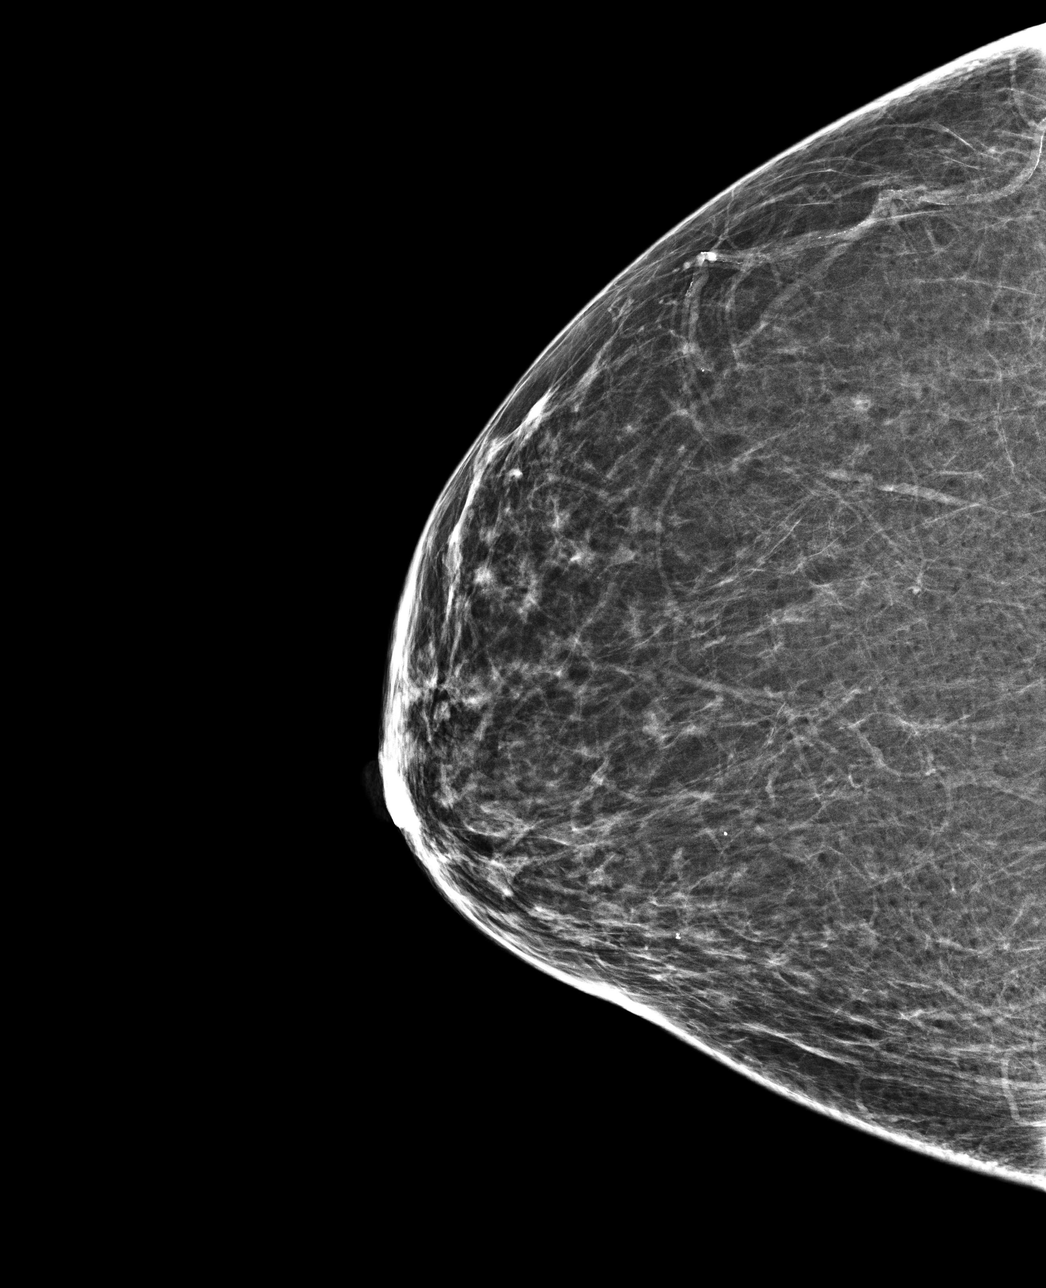

[L CC]
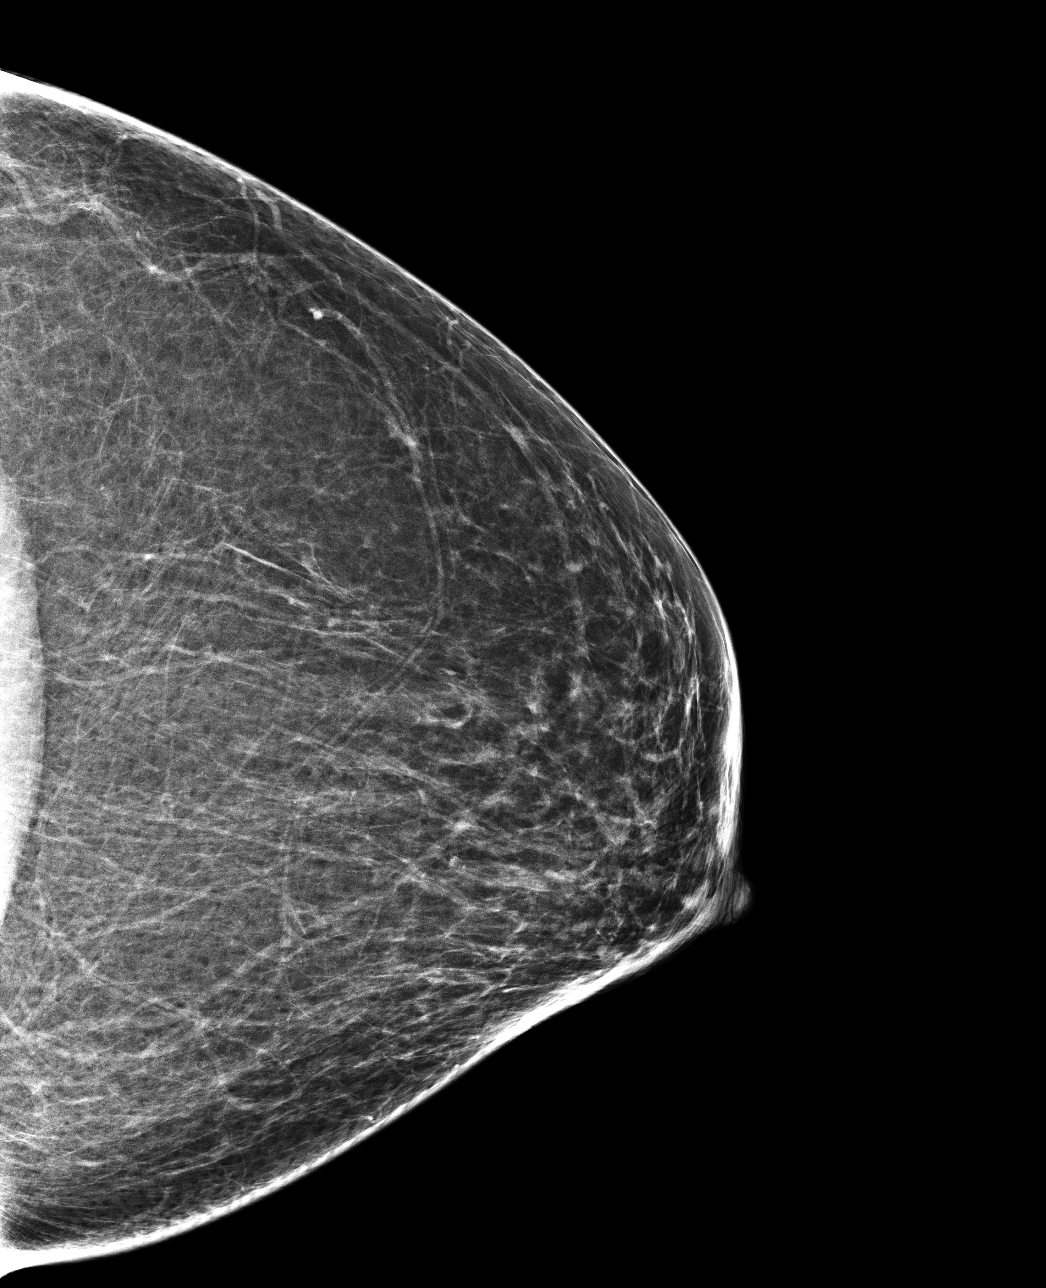

[L MLO]
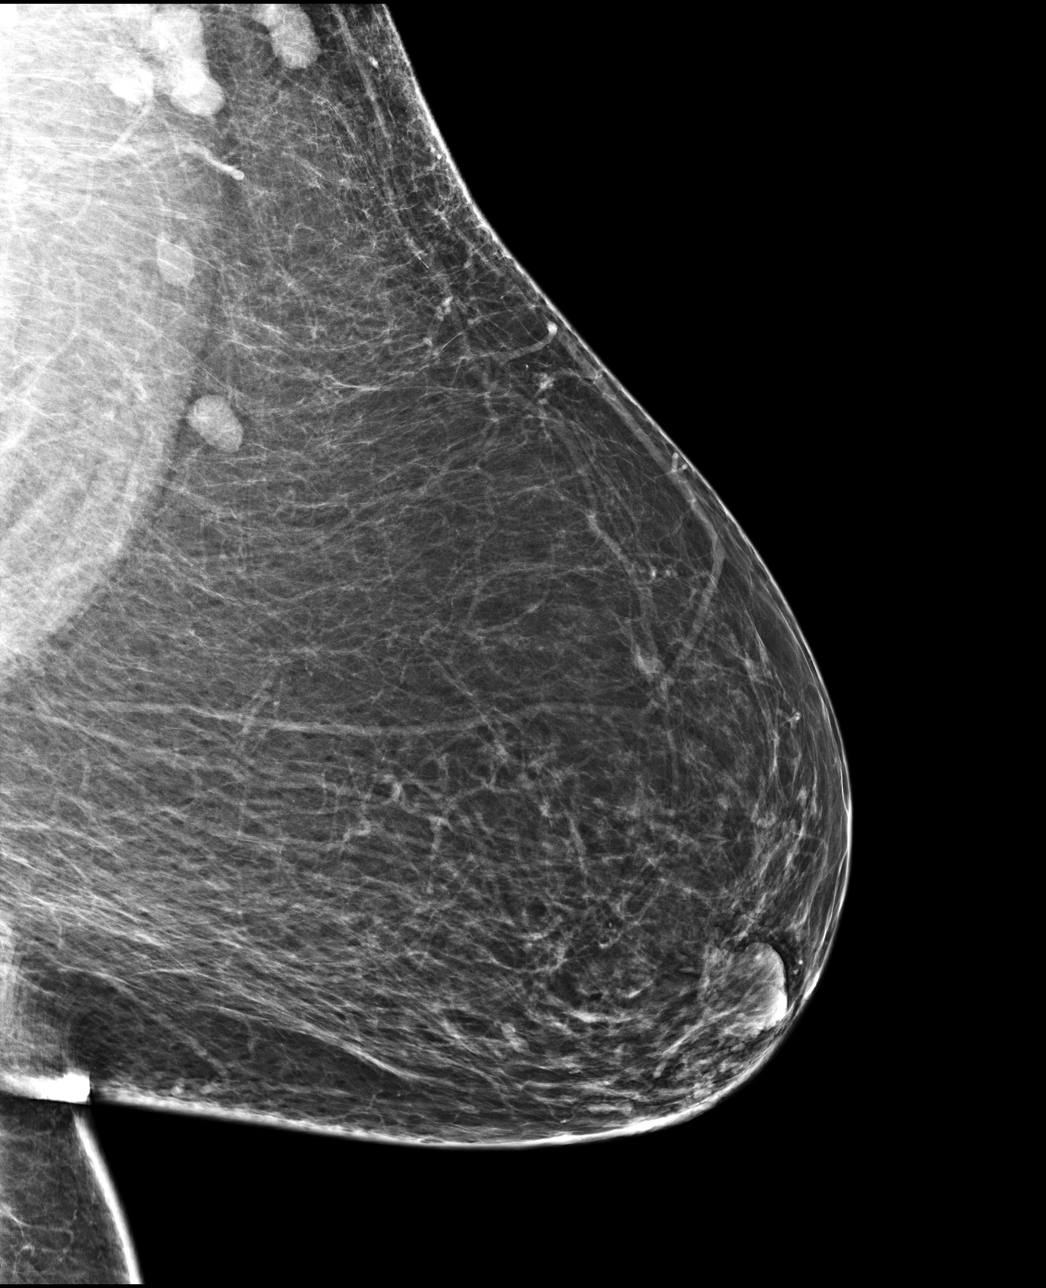

[4 of 4 positions shown; findings below may reference images not displayed]

ACR Breast Density Category b: There are scattered areas of
fibroglandular density.
FINDINGS: There are no findings suspicious for malignancy. Images were
processed with CAD.
IMPRESSION: No mammographic evidence of malignancy. A result letter of this
screening mammogram will be mailed directly to the patient.

RECOMMENDATION:
Screening mammogram in one year. (Code:[US])

BI-RADS CATEGORY  1: Negative.

## 2018-02-08 ENCOUNTER — Other Ambulatory Visit: Payer: Self-pay

## 2018-02-08 ENCOUNTER — Telehealth: Payer: Self-pay

## 2018-02-08 DIAGNOSIS — Z1211 Encounter for screening for malignant neoplasm of colon: Secondary | ICD-10-CM

## 2018-02-08 NOTE — Telephone Encounter (Signed)
Gastroenterology Pre-Procedure Review  Request Date: 02/26/18 Requesting Physician: Dr. Allegra LaiVanga  PATIENT REVIEW QUESTIONS: The patient responded to the following health history questions as indicated:    1. Are you having any GI issues? no 2. Do you have a personal history of Polyps? no 3. Do you have a family history of Colon Cancer or Polyps? no 4. Diabetes Mellitus? yes (oral meds and insulin) 5. Joint replacements in the past 12 months?no 6. Major health problems in the past 3 months?no 7. Any artificial heart valves, MVP, or defibrillator?no    MEDICATIONS & ALLERGIES:    Patient reports the following regarding taking any anticoagulation/antiplatelet therapy:   Plavix, Coumadin, Eliquis, Xarelto, Lovenox, Pradaxa, Brilinta, or Effient? no Aspirin? no  Patient confirms/reports the following medications:  No current outpatient medications on file.   No current facility-administered medications for this visit.     Patient confirms/reports the following allergies:  Allergies  Allergen Reactions  . Lisinopril Swelling    Tongue swelling  . Penicillins Rash    No orders of the defined types were placed in this encounter.   AUTHORIZATION INFORMATION Primary Insurance: 1D#: Group #:  Secondary Insurance: 1D#: Group #:  SCHEDULE INFORMATION: Date:02/26/18  Time: Location:[place]

## 2018-02-09 ENCOUNTER — Other Ambulatory Visit: Payer: Self-pay

## 2018-02-15 ENCOUNTER — Encounter: Payer: Self-pay | Admitting: Nurse Practitioner

## 2018-02-23 ENCOUNTER — Encounter: Payer: Self-pay | Admitting: Emergency Medicine

## 2018-02-26 ENCOUNTER — Ambulatory Visit: Admission: RE | Admit: 2018-02-26 | Payer: Medicare Other | Source: Ambulatory Visit | Admitting: Gastroenterology

## 2018-02-26 ENCOUNTER — Encounter: Admission: RE | Payer: Self-pay | Source: Ambulatory Visit

## 2018-02-26 SURGERY — COLONOSCOPY WITH PROPOFOL
Anesthesia: General

## 2018-05-18 ENCOUNTER — Emergency Department
Admission: EM | Admit: 2018-05-18 | Discharge: 2018-05-18 | Disposition: A | Discharge status: Home / Self Care | Payer: Medicare Other | Attending: Emergency Medicine | Admitting: Emergency Medicine

## 2018-05-18 ENCOUNTER — Emergency Department: Payer: Medicare Other

## 2018-05-18 ENCOUNTER — Other Ambulatory Visit: Payer: Self-pay

## 2018-05-18 ENCOUNTER — Encounter: Payer: Self-pay | Admitting: Emergency Medicine

## 2018-05-18 DIAGNOSIS — J45909 Unspecified asthma, uncomplicated: Secondary | ICD-10-CM | POA: Insufficient documentation

## 2018-05-18 DIAGNOSIS — R609 Edema, unspecified: Secondary | ICD-10-CM | POA: Insufficient documentation

## 2018-05-18 DIAGNOSIS — E119 Type 2 diabetes mellitus without complications: Secondary | ICD-10-CM | POA: Insufficient documentation

## 2018-05-18 DIAGNOSIS — I10 Essential (primary) hypertension: Secondary | ICD-10-CM | POA: Diagnosis not present

## 2018-05-18 DIAGNOSIS — R2243 Localized swelling, mass and lump, lower limb, bilateral: Secondary | ICD-10-CM | POA: Diagnosis present

## 2018-05-18 HISTORY — DX: Unspecified asthma, uncomplicated: J45.909

## 2018-05-18 HISTORY — DX: Type 2 diabetes mellitus without complications: E11.9

## 2018-05-18 HISTORY — DX: Essential (primary) hypertension: I10

## 2018-05-18 LAB — CBC
HEMATOCRIT: 42.7 % (ref 35.0–47.0)
Hemoglobin: 13.9 g/dL (ref 12.0–16.0)
MCH: 26.1 pg (ref 26.0–34.0)
MCHC: 32.5 g/dL (ref 32.0–36.0)
MCV: 80.5 fL (ref 80.0–100.0)
Platelets: 330 10*3/uL (ref 150–440)
RBC: 5.3 MIL/uL — AB (ref 3.80–5.20)
RDW: 14.9 % — ABNORMAL HIGH (ref 11.5–14.5)
WBC: 9.5 10*3/uL (ref 3.6–11.0)

## 2018-05-18 LAB — TROPONIN I: Troponin I: <0.03 ng/mL (ref <0.03)

## 2018-05-18 LAB — BASIC METABOLIC PANEL
Anion gap: 11 (ref 5–15)
BUN: 8 mg/dL (ref 6–20)
CHLORIDE: 102 mmol/L (ref 101–111)
CO2: 23 mmol/L (ref 22–32)
Calcium: 9.1 mg/dL (ref 8.9–10.3)
Creatinine, Ser: 0.75 mg/dL (ref 0.44–1.00)
GFR calc non Af Amer: >60 mL/min (ref >60)
Glucose, Bld: 312 mg/dL — ABNORMAL HIGH (ref 65–99)
POTASSIUM: 3.2 mmol/L — AB (ref 3.5–5.1)
Sodium: 136 mmol/L (ref 135–145)

## 2018-05-18 IMAGING — CR DG CHEST 2V
1 series · 2 of 2 positions shown · non-contrast
Comparison: Chest x-ray [DATE] report.

CLINICAL DATA: Lower extremity swelling.

EXAM:
CHEST - 2 VIEW

[Series 1: dg chest 2 view · 0.14mm/px · 2 of 2 slices shown]
[im 1/2]
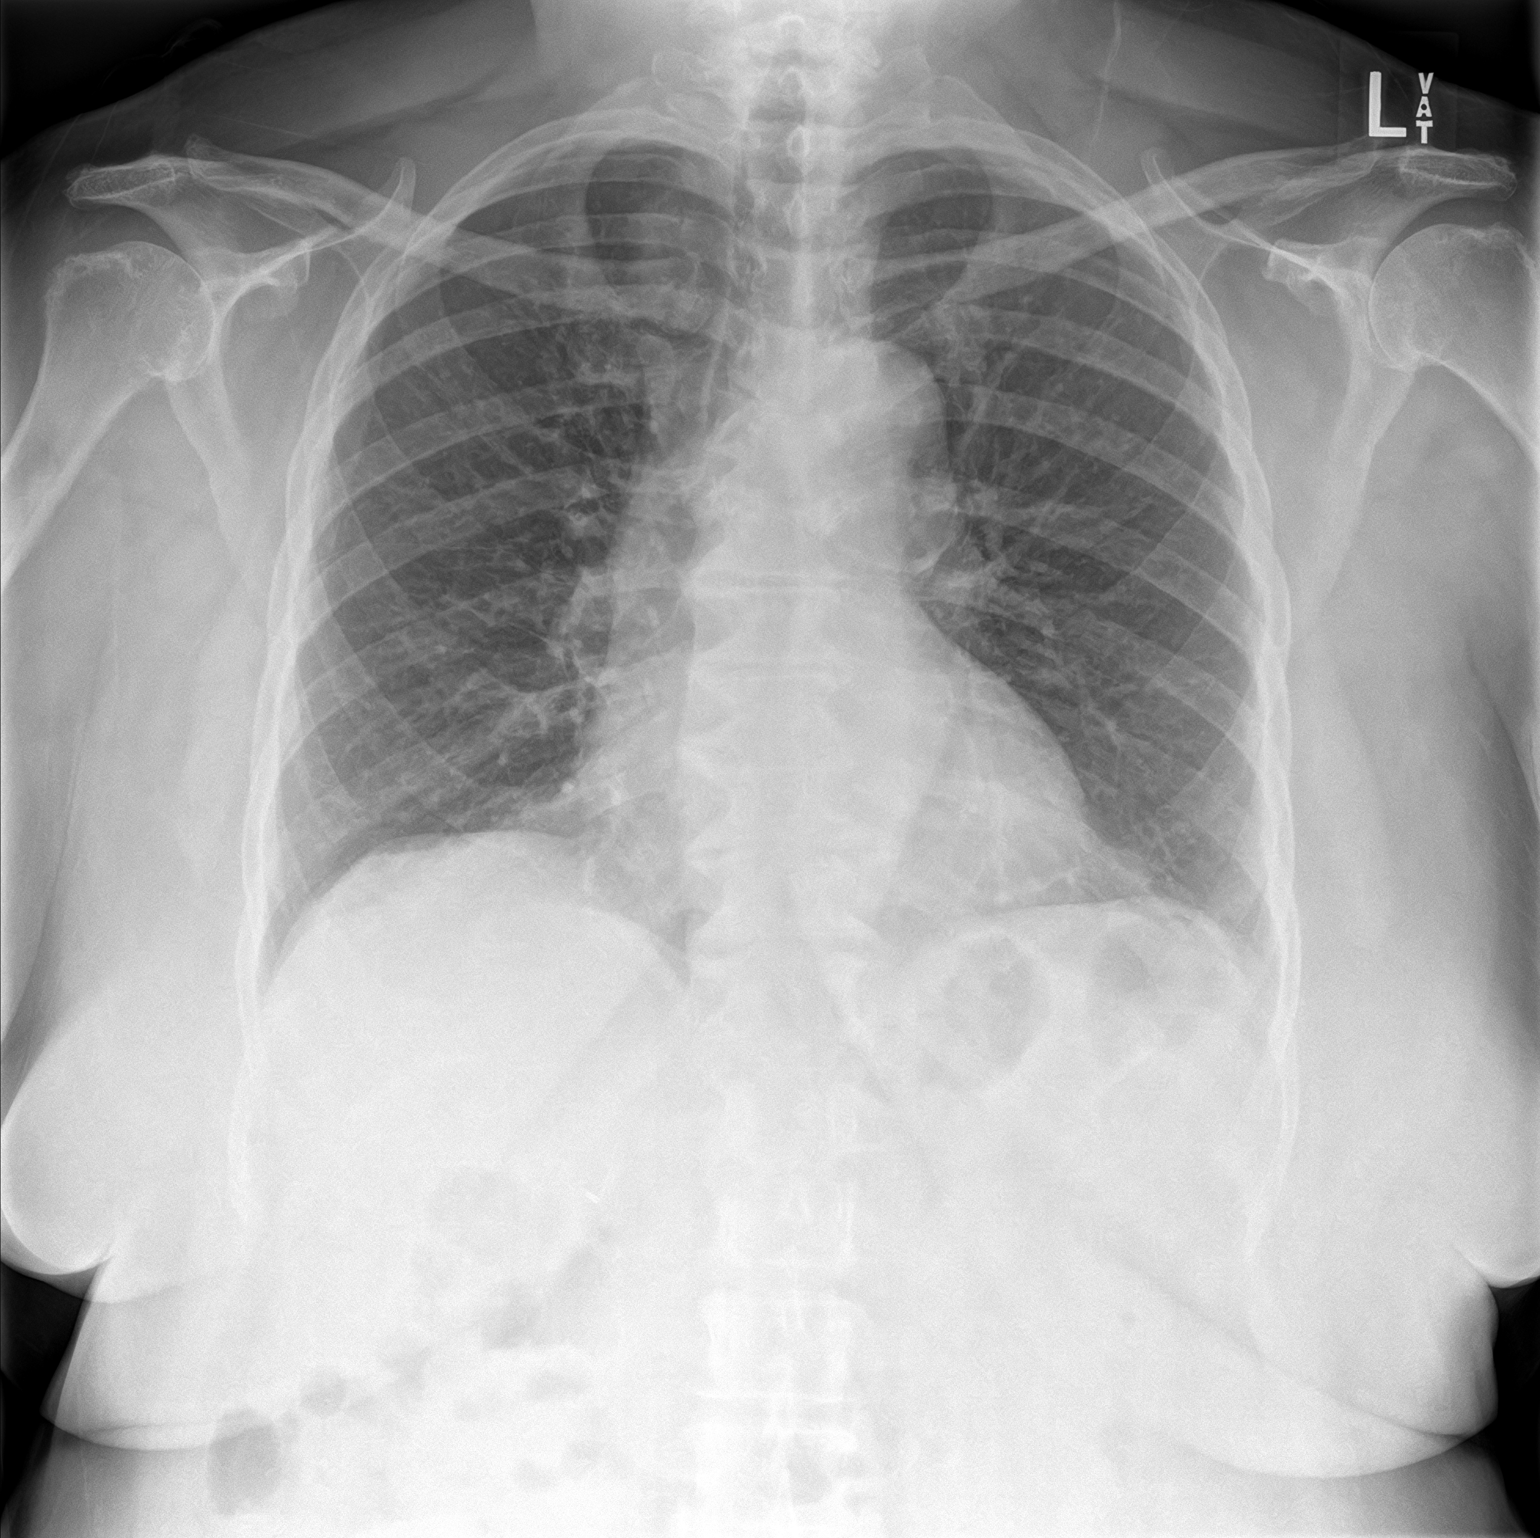
[im 2/2]
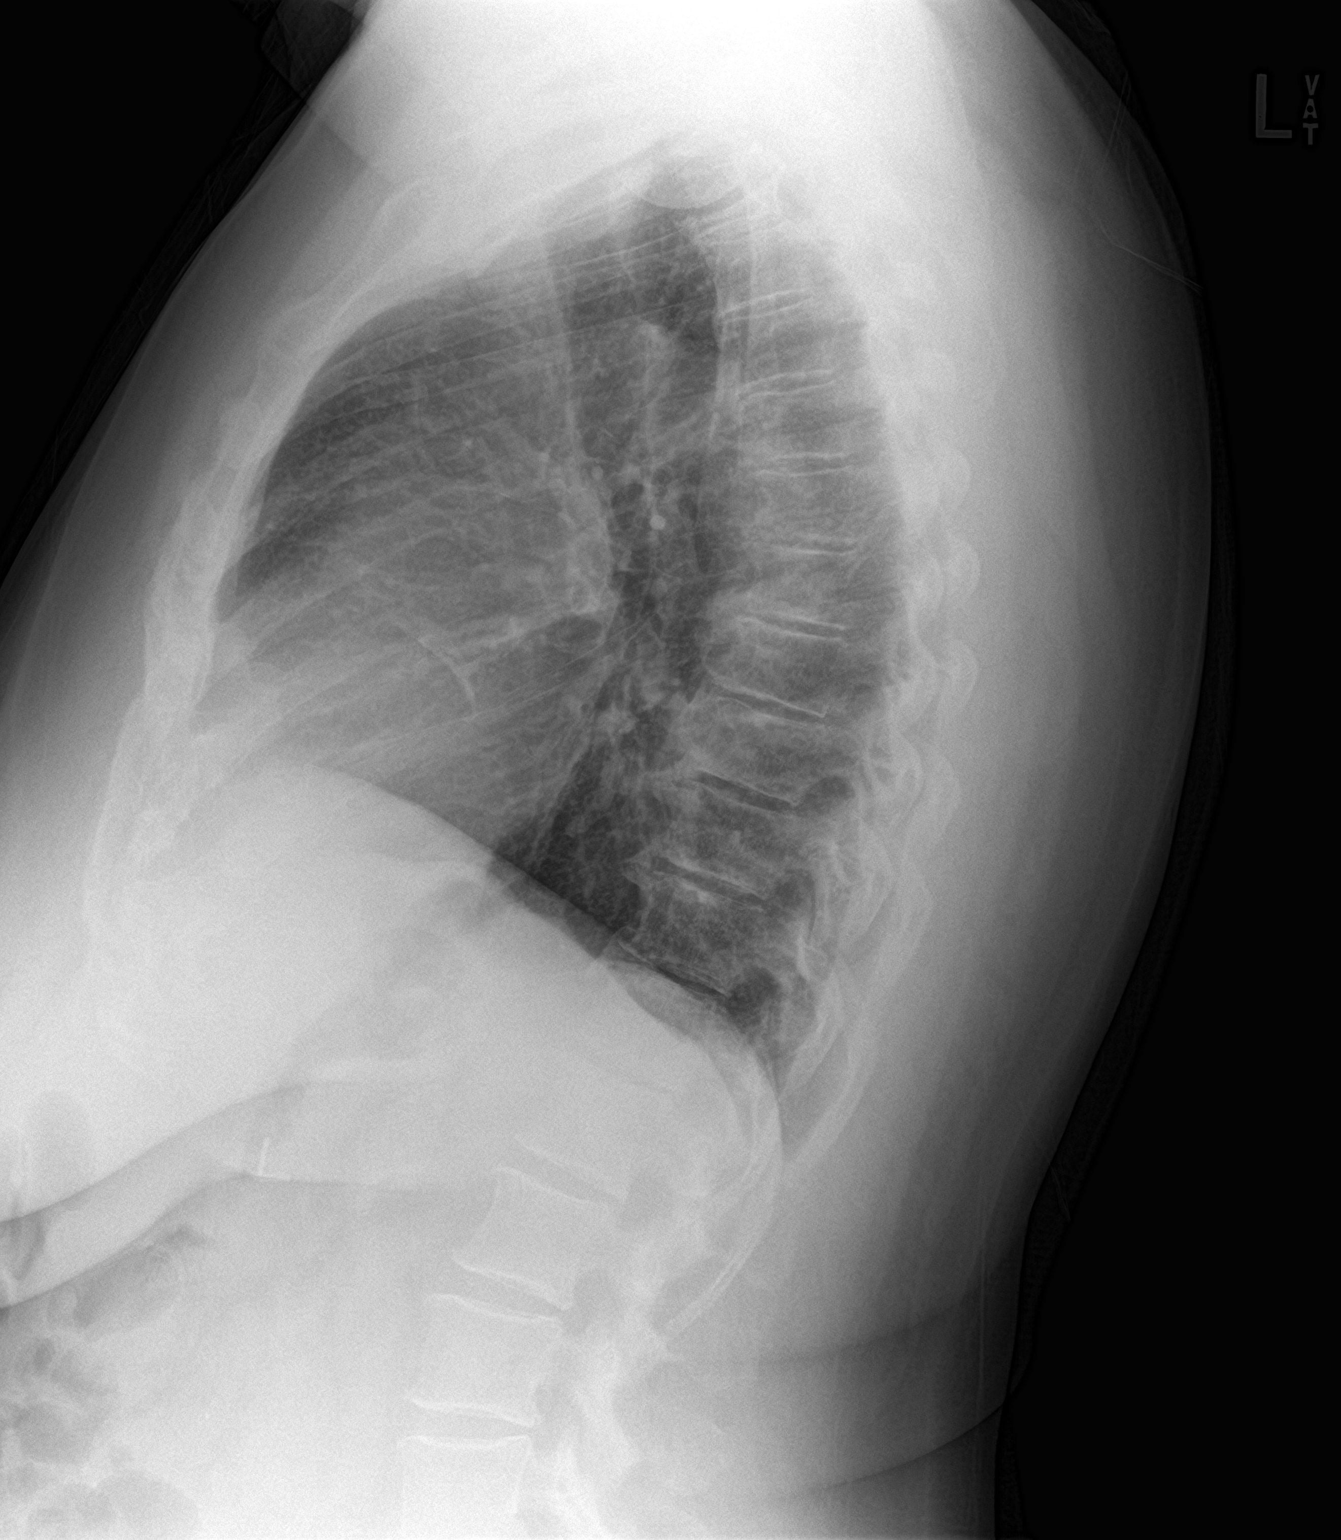

[2 of 2 positions shown; findings below may reference images not displayed]

FINDINGS: Mediastinum and hilar structures normal. Heart size normal. Low lung
volumes with mild bibasilar atelectasis. No pleural effusion or
pneumothorax. Degenerative changes scoliosis thoracic spine.
Surgical clips right upper quadrant.
IMPRESSION: Low lung volumes with mild bibasilar atelectasis. Exam otherwise
unremarkable.

## 2018-05-18 MED ORDER — FUROSEMIDE 20 MG PO TABS: 20.0000 mg | ORAL_TABLET | Freq: Every day | ORAL | 0 refills | Status: DC

## 2018-05-18 MED ORDER — MEDICAL COMPRESSION STOCKINGS MISC: 0 refills | Status: DC

## 2018-05-18 NOTE — ED Provider Notes (Signed)
[facility] Emergency Department Provider Note  Time seen: 1:19 PM  I have reviewed the triage vital signs and the nursing notes.   HISTORY  Chief Complaint Leg Swelling    HPI Marilyn Weber is a 72 y.o. female with a past medical history of asthma, diabetes, hypertension, presents to the emergency department for leg swelling.  According to the patient for the past 1 week she has noticed mild swelling in both of her feet.  States the swelling is worse throughout the day and improves somewhat at nighttime.  Patient states when she first lays down at night she will have some pain and throbbing in her feet which seem to go away.  Patient denies any history of lower extremity edema.  Denies any recent chest pain or trouble breathing.  Largely negative review of systems.   Past Medical History:  Diagnosis Date  . Asthma   . Diabetes mellitus without complication (HCC)   . Hypertension     There are no active problems to display for this patient.   Past Surgical History:  Procedure Laterality Date  . ABDOMINAL HYSTERECTOMY    . BREAST CYST ASPIRATION Bilateral     Prior to Admission medications   Not on File    Allergies  Allergen Reactions  . Lisinopril Swelling    Tongue swelling  . Penicillins Rash    Family History  Problem Relation Age of Onset  . Lung cancer Brother     Social History Social History   Tobacco Use  . Smoking status: Never Smoker  . Smokeless tobacco: Never Used  Substance Use Topics  . Alcohol use: Never    Frequency: Never  . Drug use: Not on file    Review of Systems Constitutional: Negative for fever. Eyes: Negative for visual complaints ENT: Negative for recent illness/congestion Cardiovascular: Negative for chest pain. Respiratory: Negative for shortness of breath. Gastrointestinal: Negative for abdominal pain.  States intermittent nausea but denies any recent vomiting or diarrhea. Genitourinary: Negative  for urinary compaints Musculoskeletal: Negative for musculoskeletal complaints Skin: Negative for skin complaints  Neurological: Negative for headache All other ROS negative  ____________________________________________   PHYSICAL EXAM:  VITAL SIGNS: ED Triage Vitals  Enc Vitals Group     BP 05/18/18 1114 113/77     Pulse Rate 05/18/18 1114 78     Resp 05/18/18 1114 16     Temp 05/18/18 1114 98.1 F (36.7 C)     Temp Source 05/18/18 1114 Oral     SpO2 05/18/18 1114 99 %     Weight 05/18/18 1115 196 lb (88.9 kg)     Height 05/18/18 1115  (1.626 m)     Head Circumference --      Peak Flow --      Pain Score 05/18/18 1114 10     Pain Loc --      Pain Edu? --      Excl. in GC? --     Constitutional: Alert and oriented. Well appearing and in no distress. Eyes: Normal exam ENT   Head: Normocephalic and atraumatic.   Mouth/Throat: Mucous membranes are moist. Cardiovascular: Normal rate, regular rhythm. No murmur Respiratory: Normal respiratory effort without tachypnea nor retractions. Breath sounds are clear  Gastrointestinal: Soft and nontender. No distention.   Musculoskeletal: Nontender with normal range of motion in all extremities.  Mild pedal edema bilaterally.  2+ DP pulses bilaterally.  No erythema. Neurologic:  Normal speech and language. No gross  focal neurologic deficits  Skin:  Skin is warm, dry and intact.  Psychiatric: Mood and affect are normal.   ____________________________________________    EKG  EKG reviewed and interpreted by myself shows normal sinus rhythm at 79 bpm with a narrow QRS, left axis deviation, largely normal intervals with nonspecific ST changes.  ____________________________________________    RADIOLOGY  Chest x-ray is negative  ____________________________________________   INITIAL IMPRESSION / ASSESSMENT AND PLAN / ED COURSE  Pertinent labs & imaging results that were available during my care of the patient were  reviewed by me and considered in my medical decision making (see chart for details).  Patient presents to the emergency department for lower extremity edema/pedal edema x1 week.  Overall the patient appears very well, no distress.  Differential includes CHF, venous stasis, peripheral edema.  Patient has no chest pain or shortness of breath.  Patient's labs including cardiac enzymes are normal besides slight glucose elevation, patient is diabetic.  Chest x-ray is negative.  EKG is reassuring.  I discussed with the patient follow-up with cardiology for an echocardiogram.  We will prescribe a one-week course of low-dose Lasix as well as compression stockings.  I discussed this with the patient to use the stockings during the day and take them off at night take Lasix in the morning for 1 week.  Patient agreeable to this plan of care.  ____________________________________________   FINAL CLINICAL IMPRESSION(S) / ED DIAGNOSES  Peripheral edema    Minna Antis, MD 05/18/18 1322

## 2018-05-18 NOTE — ED Triage Notes (Signed)
Says she was sent here [facility] due to feet swelling.   Says she noticed the swelling last week.  No increased shortness of breath.

## 2018-05-18 NOTE — Discharge Instructions (Signed)
Please take your Lasix as prescribed for the next 7 days.  Please use your compression stockings during the daytime and remove them at night while sleeping.  Please follow-up with cardiology by calling the number provided to arrange a follow-up appointment.  Return to the emergency department for any significant worsening of swelling, any chest pain or trouble breathing.

## 2018-05-18 NOTE — ED Notes (Signed)
Pt assisted to toilet. Pt ambulated without difficulty. 
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
## 2018-11-19 ENCOUNTER — Other Ambulatory Visit: Payer: Self-pay | Admitting: Family Medicine

## 2018-11-19 DIAGNOSIS — Z1231 Encounter for screening mammogram for malignant neoplasm of breast: Secondary | ICD-10-CM

## 2019-04-24 ENCOUNTER — Emergency Department
Admission: EM | Admit: 2019-04-24 | Discharge: 2019-04-24 | Disposition: A | Discharge status: Home / Self Care | Payer: Medicare Other | Attending: Emergency Medicine | Admitting: Emergency Medicine

## 2019-04-24 ENCOUNTER — Other Ambulatory Visit: Payer: Self-pay

## 2019-04-24 ENCOUNTER — Emergency Department: Payer: Medicare Other

## 2019-04-24 ENCOUNTER — Encounter: Payer: Self-pay | Admitting: Emergency Medicine

## 2019-04-24 DIAGNOSIS — J45909 Unspecified asthma, uncomplicated: Secondary | ICD-10-CM | POA: Insufficient documentation

## 2019-04-24 DIAGNOSIS — M542 Cervicalgia: Secondary | ICD-10-CM | POA: Insufficient documentation

## 2019-04-24 DIAGNOSIS — S0081XA Abrasion of other part of head, initial encounter: Secondary | ICD-10-CM | POA: Diagnosis not present

## 2019-04-24 DIAGNOSIS — E119 Type 2 diabetes mellitus without complications: Secondary | ICD-10-CM | POA: Insufficient documentation

## 2019-04-24 DIAGNOSIS — Y939 Activity, unspecified: Secondary | ICD-10-CM | POA: Diagnosis not present

## 2019-04-24 DIAGNOSIS — I10 Essential (primary) hypertension: Secondary | ICD-10-CM | POA: Diagnosis not present

## 2019-04-24 DIAGNOSIS — S80211A Abrasion, right knee, initial encounter: Secondary | ICD-10-CM | POA: Diagnosis not present

## 2019-04-24 DIAGNOSIS — R51 Headache: Secondary | ICD-10-CM | POA: Insufficient documentation

## 2019-04-24 DIAGNOSIS — M7918 Myalgia, other site: Secondary | ICD-10-CM

## 2019-04-24 DIAGNOSIS — Y999 Unspecified external cause status: Secondary | ICD-10-CM | POA: Insufficient documentation

## 2019-04-24 DIAGNOSIS — S80212A Abrasion, left knee, initial encounter: Secondary | ICD-10-CM | POA: Insufficient documentation

## 2019-04-24 DIAGNOSIS — Y92410 Unspecified street and highway as the place of occurrence of the external cause: Secondary | ICD-10-CM | POA: Diagnosis not present

## 2019-04-24 DIAGNOSIS — S8990XA Unspecified injury of unspecified lower leg, initial encounter: Secondary | ICD-10-CM | POA: Diagnosis present

## 2019-04-24 DIAGNOSIS — T148XXA Other injury of unspecified body region, initial encounter: Secondary | ICD-10-CM
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
## 2019-04-24 MED ORDER — TRAMADOL HCL 50 MG PO TABS: 50.0000 mg | ORAL_TABLET | Freq: Four times a day (QID) | ORAL | 0 refills | Status: DC | PRN

## 2019-04-24 MED ORDER — TRAMADOL HCL 50 MG PO TABS: 50.0000 mg | ORAL_TABLET | Freq: Four times a day (QID) | ORAL | 0 refills | Status: AC | PRN

## 2019-04-24 MED ORDER — TRAMADOL HCL 50 MG PO TABS
50.0000 mg | ORAL_TABLET | Freq: Once | ORAL | Status: AC
  Administered 2019-04-24: 50 mg via ORAL
  Filled 2019-04-24: qty 1

## 2019-04-24 MED ORDER — CYCLOBENZAPRINE HCL 10 MG PO TABS: 10.0000 mg | ORAL_TABLET | Freq: Three times a day (TID) | ORAL | 0 refills | Status: DC | PRN

## 2019-04-24 MED ORDER — OXYCODONE-ACETAMINOPHEN 5-325 MG PO TABS
2.0000 | ORAL_TABLET | Freq: Once | ORAL | Status: AC
  Administered 2019-04-24: 2 via ORAL
  Filled 2019-04-24: qty 2

## 2019-04-24 MED ORDER — OXYCODONE-ACETAMINOPHEN 5-325 MG PO TABS: 2.0000 | ORAL_TABLET | Freq: Once | ORAL | Status: DC
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
## 2019-05-15 ENCOUNTER — Other Ambulatory Visit: Payer: Self-pay | Admitting: Family Medicine

## 2019-05-15 DIAGNOSIS — N6312 Unspecified lump in the right breast, upper inner quadrant: Secondary | ICD-10-CM

## 2019-05-20 ENCOUNTER — Other Ambulatory Visit: Payer: Self-pay | Admitting: Family Medicine

## 2019-05-20 DIAGNOSIS — N6312 Unspecified lump in the right breast, upper inner quadrant: Secondary | ICD-10-CM

## 2019-05-24 ENCOUNTER — Ambulatory Visit
Admission: RE | Admit: 2019-05-24 | Discharge: 2019-05-24 | Disposition: A | Discharge status: Home / Self Care | Payer: Medicare Other | Source: Ambulatory Visit | Attending: Family Medicine | Admitting: Family Medicine

## 2019-05-24 ENCOUNTER — Other Ambulatory Visit: Payer: Self-pay

## 2019-05-24 DIAGNOSIS — N6312 Unspecified lump in the right breast, upper inner quadrant: Secondary | ICD-10-CM | POA: Insufficient documentation
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
## 2019-06-03 ENCOUNTER — Other Ambulatory Visit: Payer: Self-pay | Admitting: Family Medicine

## 2019-06-03 DIAGNOSIS — N6310 Unspecified lump in the right breast, unspecified quadrant: Secondary | ICD-10-CM

## 2019-08-27 ENCOUNTER — Ambulatory Visit
Admission: RE | Admit: 2019-08-27 | Discharge: 2019-08-27 | Disposition: A | Discharge status: Home / Self Care | Payer: Medicare Other | Source: Ambulatory Visit | Attending: Family Medicine | Admitting: Family Medicine

## 2019-08-27 DIAGNOSIS — N6310 Unspecified lump in the right breast, unspecified quadrant: Secondary | ICD-10-CM | POA: Insufficient documentation

## 2019-08-27 DIAGNOSIS — N641 Fat necrosis of breast: Secondary | ICD-10-CM | POA: Diagnosis not present
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
## 2019-09-19 ENCOUNTER — Other Ambulatory Visit: Payer: Self-pay

## 2019-10-12 DIAGNOSIS — M545 Low back pain: Secondary | ICD-10-CM | POA: Diagnosis present

## 2019-10-12 DIAGNOSIS — I10 Essential (primary) hypertension: Secondary | ICD-10-CM | POA: Diagnosis not present

## 2019-10-12 DIAGNOSIS — E119 Type 2 diabetes mellitus without complications: Secondary | ICD-10-CM | POA: Insufficient documentation

## 2019-10-12 DIAGNOSIS — J45909 Unspecified asthma, uncomplicated: Secondary | ICD-10-CM | POA: Insufficient documentation

## 2019-10-12 DIAGNOSIS — Z79899 Other long term (current) drug therapy: Secondary | ICD-10-CM | POA: Diagnosis not present

## 2019-10-12 DIAGNOSIS — Z7984 Long term (current) use of oral hypoglycemic drugs: Secondary | ICD-10-CM | POA: Diagnosis not present

## 2019-10-13 ENCOUNTER — Emergency Department
Admission: EM | Admit: 2019-10-13 | Discharge: 2019-10-13 | Disposition: A | Discharge status: Home / Self Care | Payer: Medicare Other | Attending: Emergency Medicine | Admitting: Emergency Medicine

## 2019-10-13 ENCOUNTER — Other Ambulatory Visit: Payer: Self-pay

## 2019-10-13 ENCOUNTER — Emergency Department: Payer: Medicare Other

## 2019-10-13 DIAGNOSIS — M545 Low back pain, unspecified: Secondary | ICD-10-CM

## 2019-10-13 LAB — CBC WITH DIFFERENTIAL/PLATELET
Abs Immature Granulocytes: 0.02 10*3/uL (ref 0.00–0.07)
Basophils Absolute: 0 10*3/uL (ref 0.0–0.1)
Basophils Relative: 0 %
Eosinophils Absolute: 0.2 10*3/uL (ref 0.0–0.5)
Eosinophils Relative: 2 %
HCT: 45.3 % (ref 36.0–46.0)
Hemoglobin: 14.5 g/dL (ref 12.0–15.0)
Immature Granulocytes: 0 %
Lymphocytes Relative: 47 %
Lymphs Abs: 5.3 10*3/uL — ABNORMAL HIGH (ref 0.7–4.0)
MCH: 24.7 pg — ABNORMAL LOW (ref 26.0–34.0)
MCHC: 32 g/dL (ref 30.0–36.0)
MCV: 77.3 fL — ABNORMAL LOW (ref 80.0–100.0)
Monocytes Absolute: 0.6 10*3/uL (ref 0.1–1.0)
Monocytes Relative: 5 %
Neutro Abs: 5.2 10*3/uL (ref 1.7–7.7)
Neutrophils Relative %: 46 %
Platelets: 316 10*3/uL (ref 150–400)
RBC: 5.86 MIL/uL — ABNORMAL HIGH (ref 3.87–5.11)
RDW: 14.6 % (ref 11.5–15.5)
WBC: 11.3 10*3/uL — ABNORMAL HIGH (ref 4.0–10.5)
nRBC: 0 % (ref 0.0–0.2)

## 2019-10-13 LAB — URINALYSIS, COMPLETE (UACMP) WITH MICROSCOPIC
Bacteria, UA: NONE SEEN
Bilirubin Urine: NEGATIVE
Glucose, UA: >=500 mg/dL — AB
Hgb urine dipstick: NEGATIVE
Ketones, ur: NEGATIVE mg/dL
Leukocytes,Ua: NEGATIVE
Nitrite: NEGATIVE
Protein, ur: NEGATIVE mg/dL
Specific Gravity, Urine: 1.011 (ref 1.005–1.030)
pH: 5 (ref 5.0–8.0)

## 2019-10-13 LAB — COMPREHENSIVE METABOLIC PANEL
ALT: 36 U/L (ref 0–44)
AST: 34 U/L (ref 15–41)
Albumin: 3.8 g/dL (ref 3.5–5.0)
Alkaline Phosphatase: 88 U/L (ref 38–126)
Anion gap: 7 (ref 5–15)
BUN: 12 mg/dL (ref 8–23)
CO2: 30 mmol/L (ref 22–32)
Calcium: 9.5 mg/dL (ref 8.9–10.3)
Chloride: 102 mmol/L (ref 98–111)
Creatinine, Ser: 0.67 mg/dL (ref 0.44–1.00)
GFR calc Af Amer: >60 mL/min (ref >60)
GFR calc non Af Amer: >60 mL/min (ref >60)
Glucose, Bld: 107 mg/dL — ABNORMAL HIGH (ref 70–99)
Potassium: 3.5 mmol/L (ref 3.5–5.1)
Sodium: 139 mmol/L (ref 135–145)
Total Bilirubin: 0.5 mg/dL (ref 0.3–1.2)
Total Protein: 7.8 g/dL (ref 6.5–8.1)

## 2019-10-13 LAB — TROPONIN I (HIGH SENSITIVITY): Troponin I (High Sensitivity): 7 ng/L (ref <18)

## 2019-10-13 IMAGING — CR DG CHEST 2V
2 series · 2 of 2 positions shown · non-contrast
Comparison: [DATE]

CLINICAL DATA: Chest pain

EXAM:
CHEST - 2 VIEW

[chest pa]
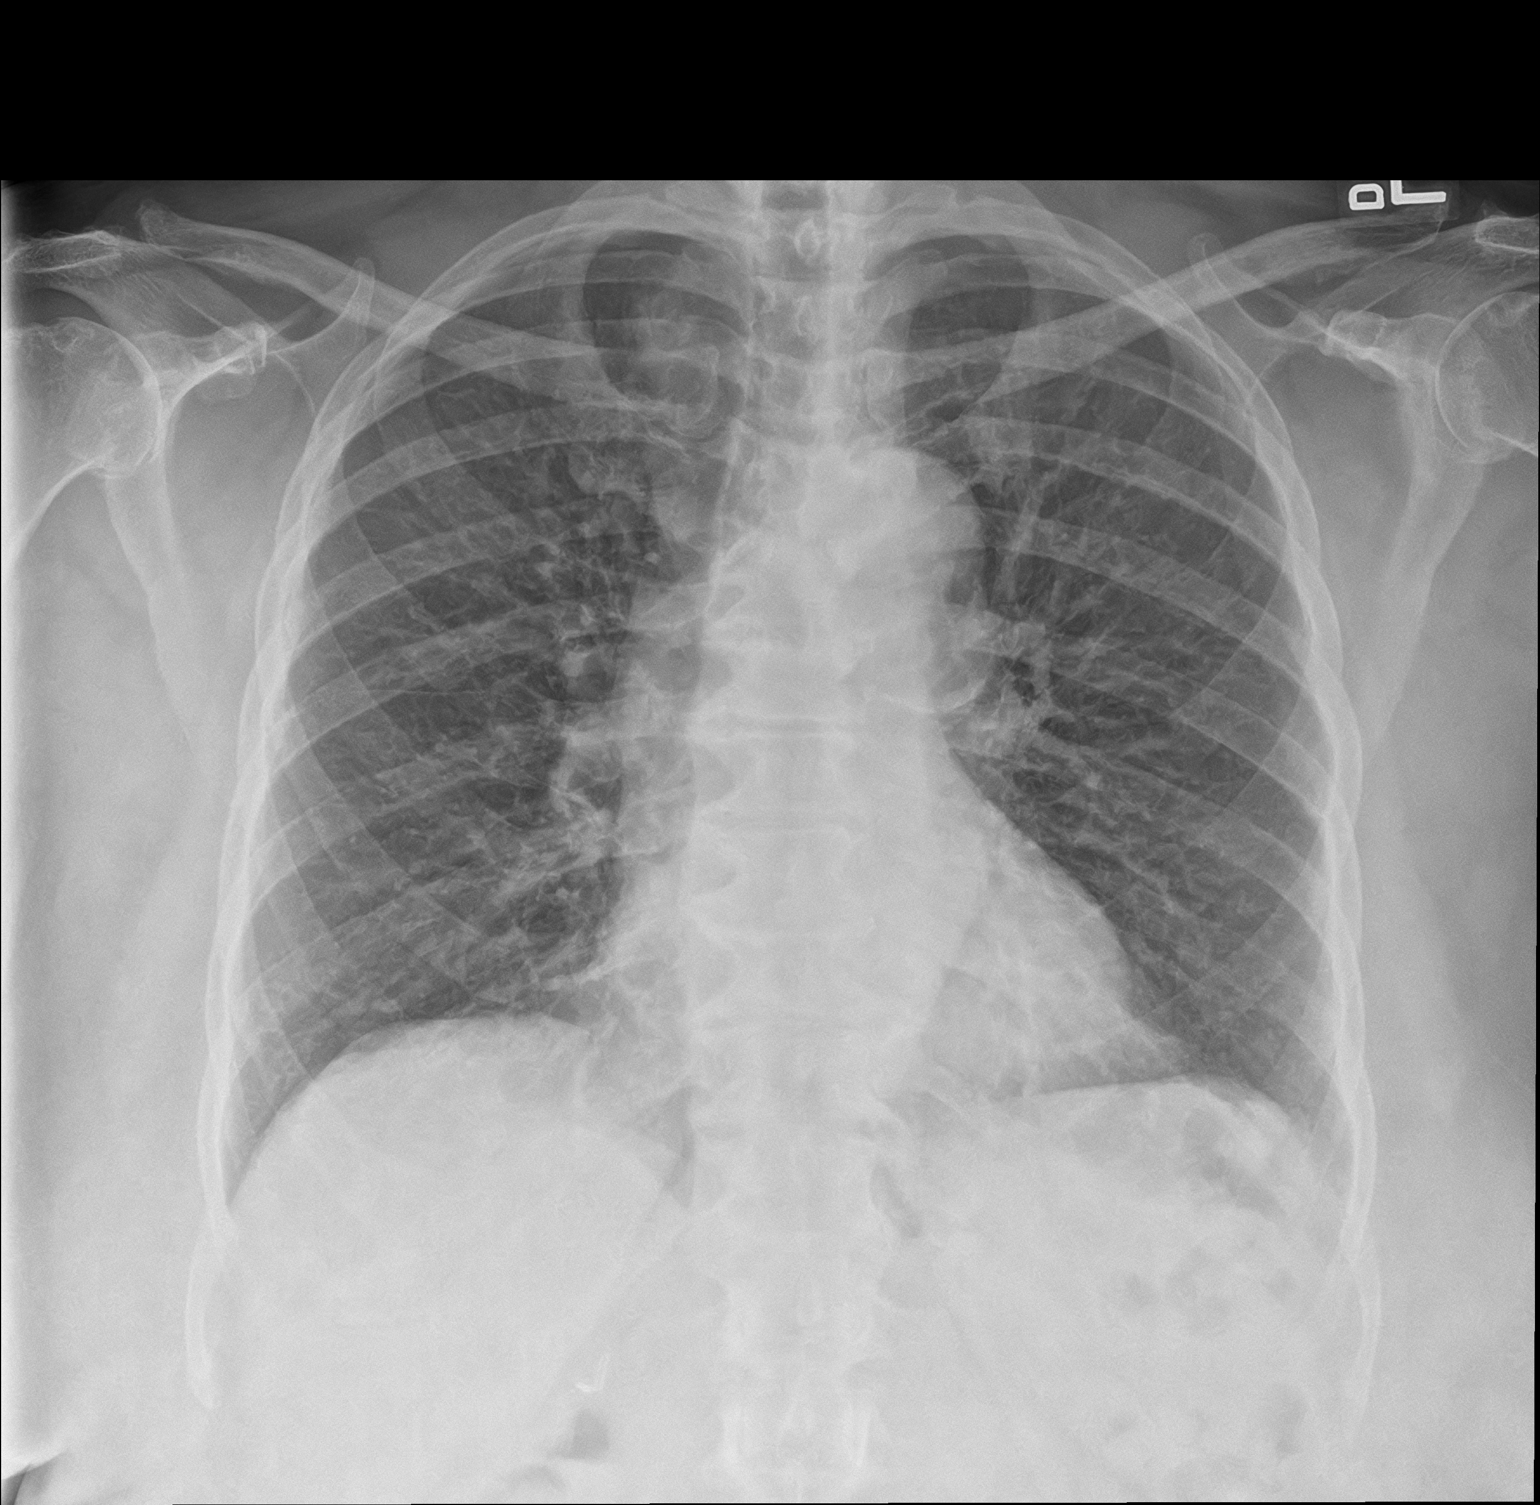

[chest lat]
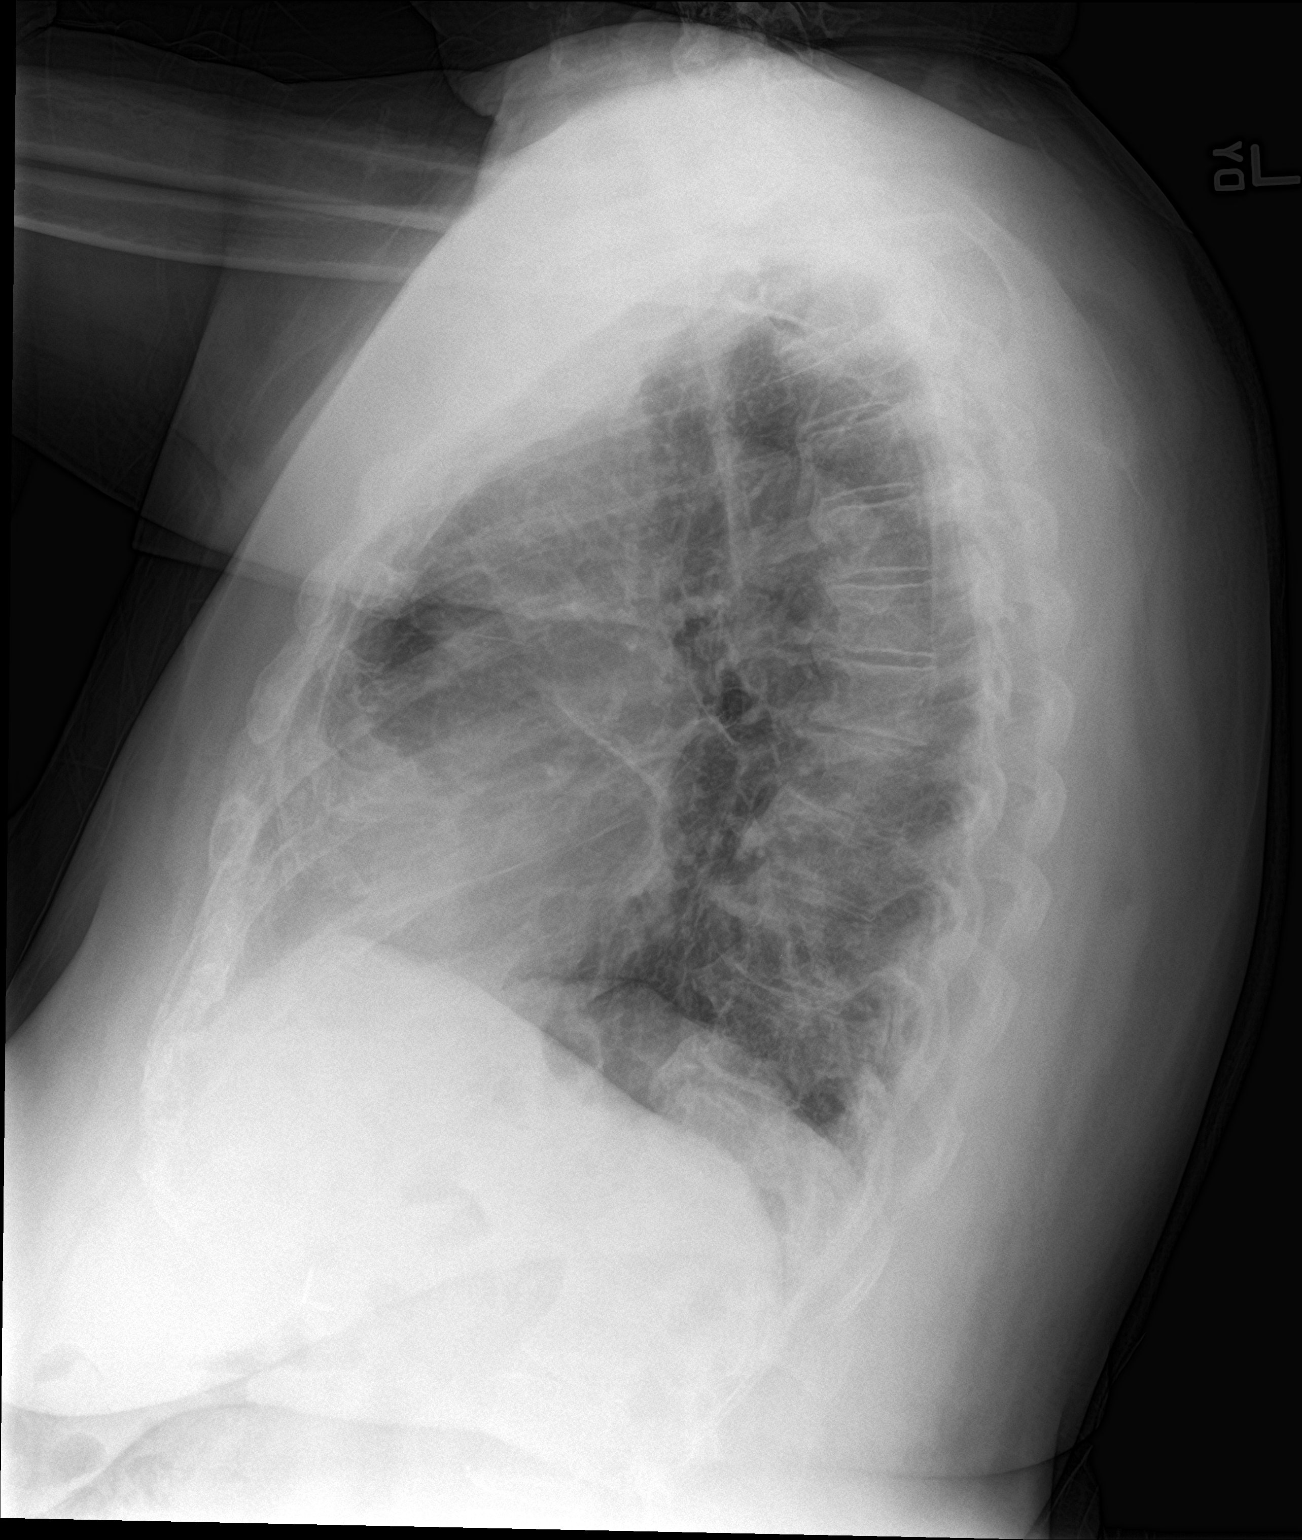

[2 of 2 positions shown; findings below may reference images not displayed]

FINDINGS: There is interstitial thickening bilaterally. There is no edema or
consolidation. Heart size and pulmonary vascularity are within
normal limits. No adenopathy. There is degenerative change in the
thoracic spine.
IMPRESSION: Interstitial thickening bilaterally. No edema or consolidation.
Stable cardiac silhouette.

## 2019-10-13 IMAGING — CR DG HIP (WITH OR WITHOUT PELVIS) 2-3V*R*
3 series · 3 of 3 positions shown · non-contrast
Comparison: None.

CLINICAL DATA: Low back pain right hip pain

EXAM:
DG HIP (WITH OR WITHOUT PELVIS) 2-3V RIGHT

[pelvis ap]
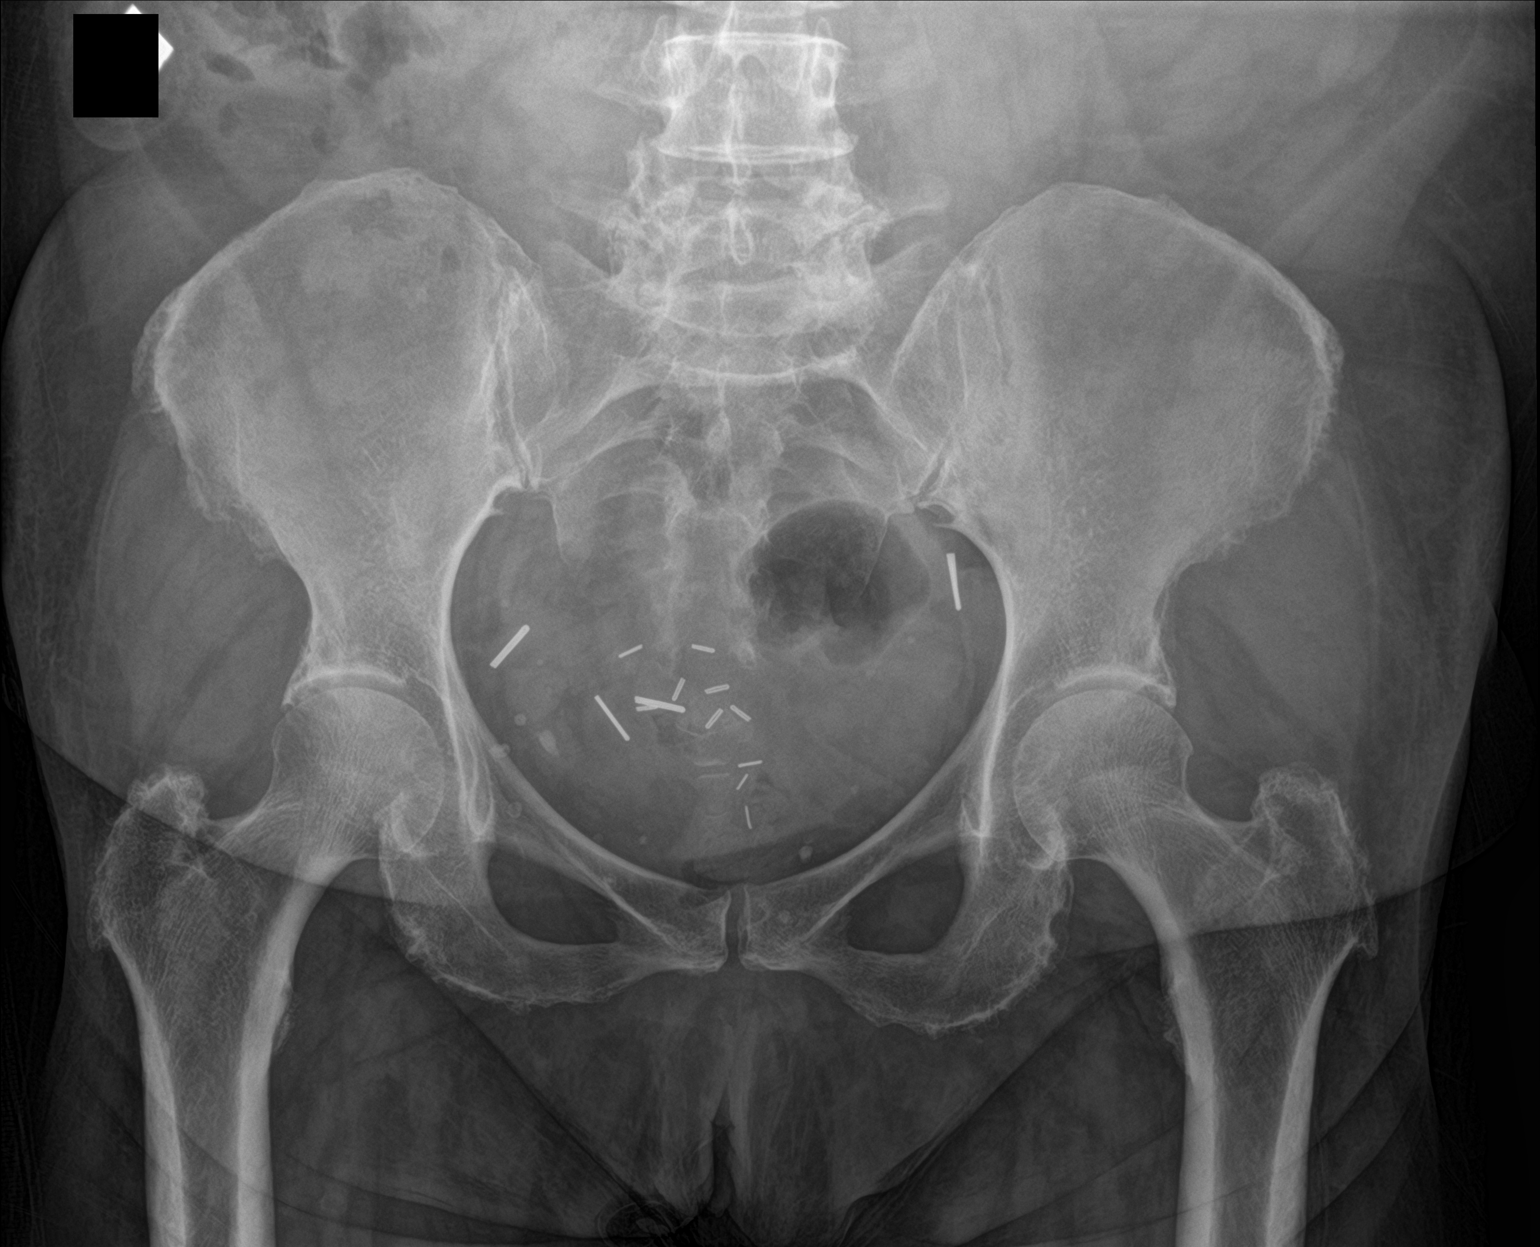

[hip ap]
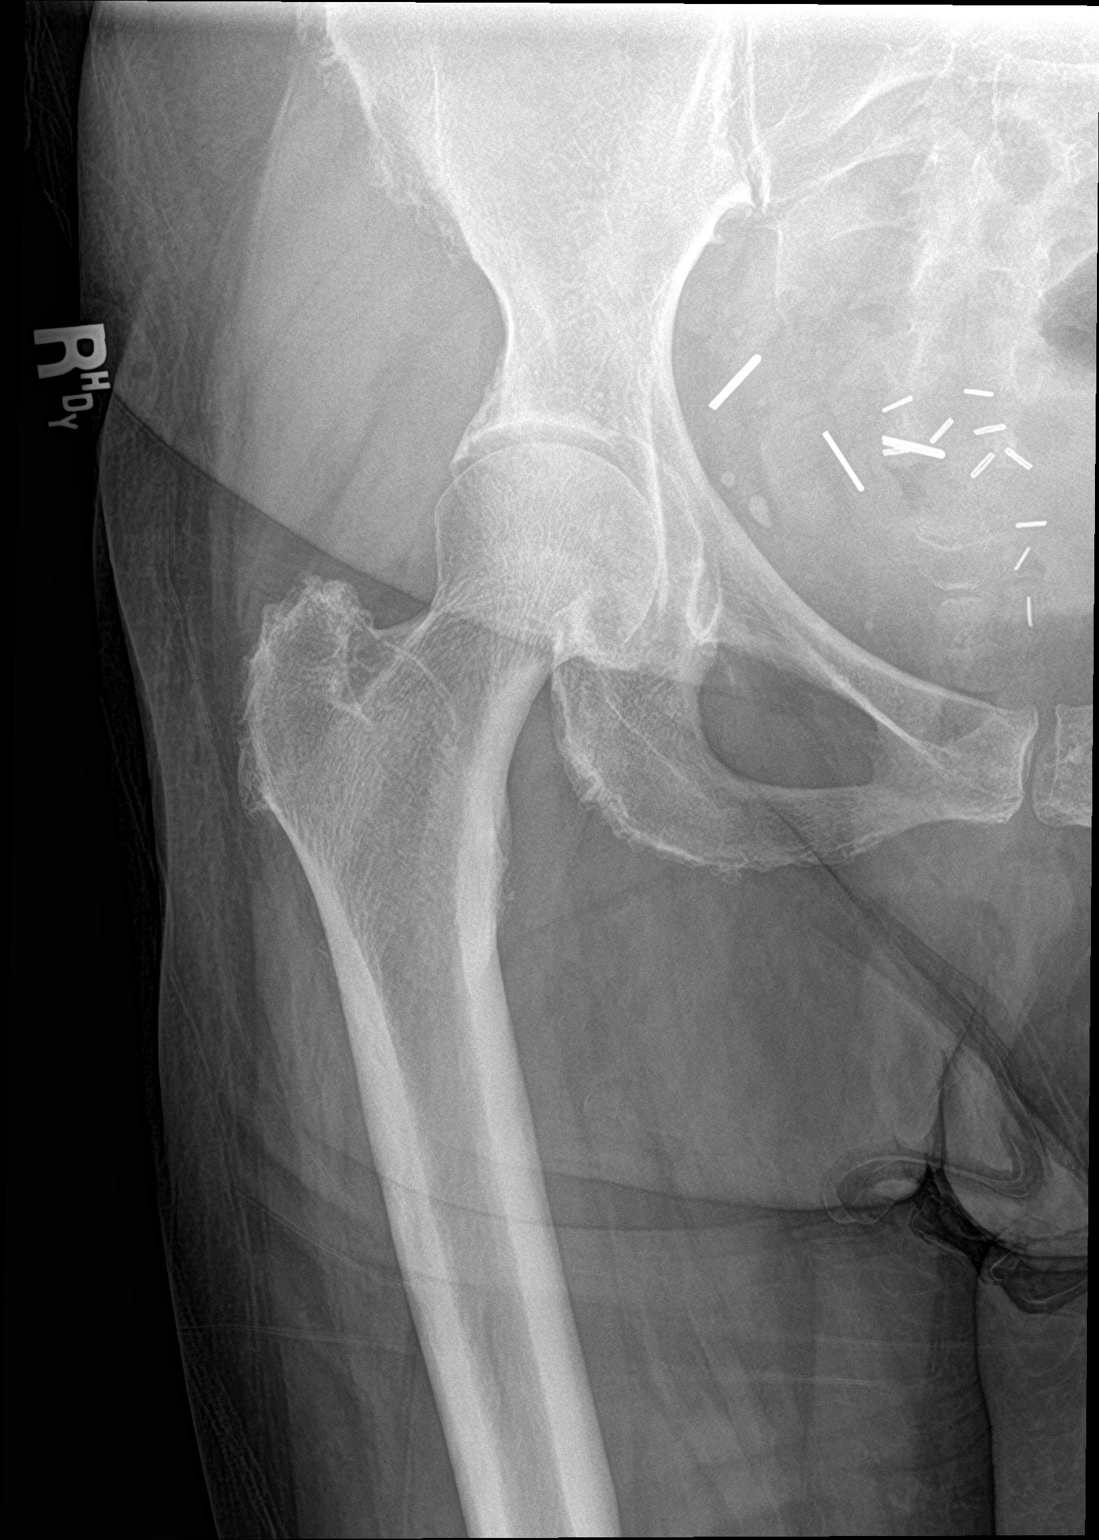

[hip lat]
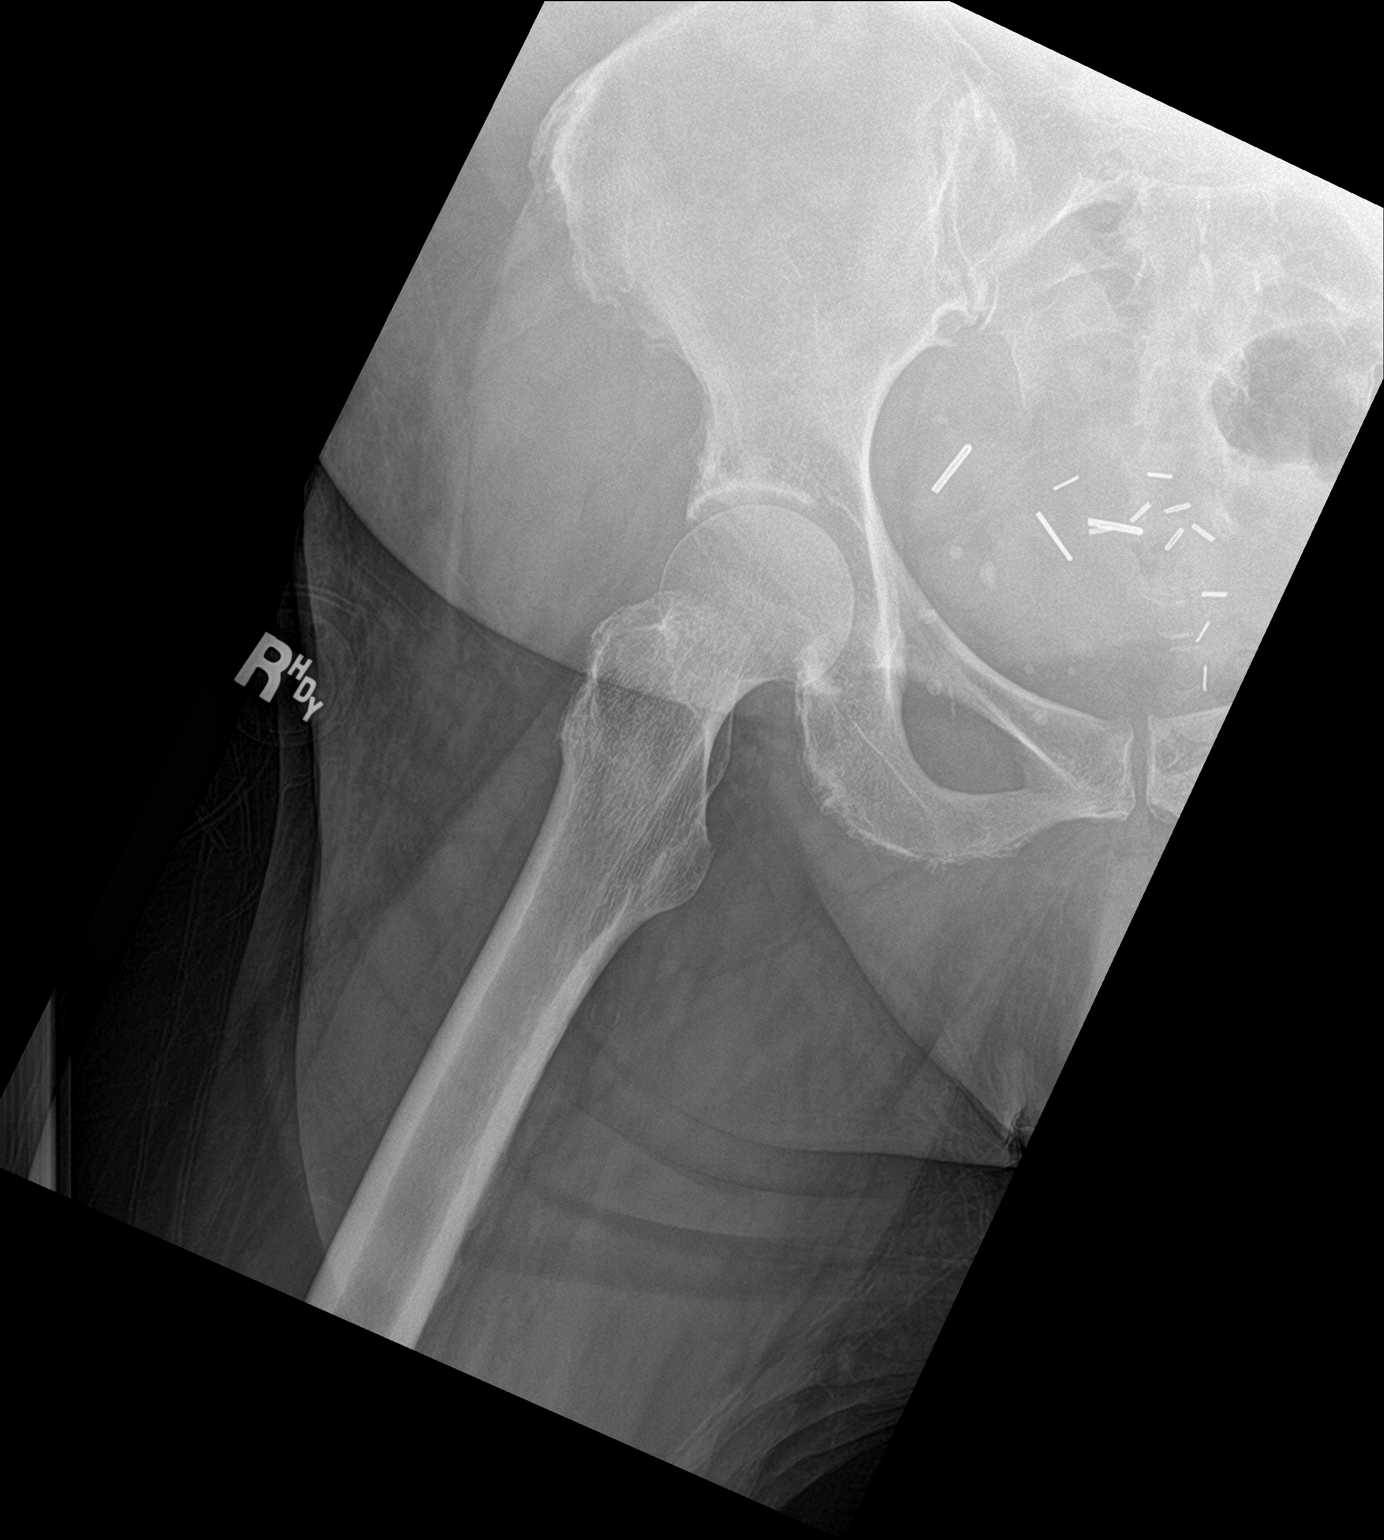

[3 of 3 positions shown; findings below may reference images not displayed]

FINDINGS: Mild degenerative change right hip with mild joint space narrowing
and mild acetabular spurring. No fracture or AVN. Surgical clips in
the pelvis.
IMPRESSION: No acute abnormality.  Mild degenerative change right hip.

## 2019-10-13 IMAGING — CR DG LUMBAR SPINE 2-3V
3 series · 3 of 3 positions shown · non-contrast
Comparison: [DATE]

CLINICAL DATA: Low back pain

EXAM:
LUMBAR SPINE - 2-3 VIEW

[l-spine ap]
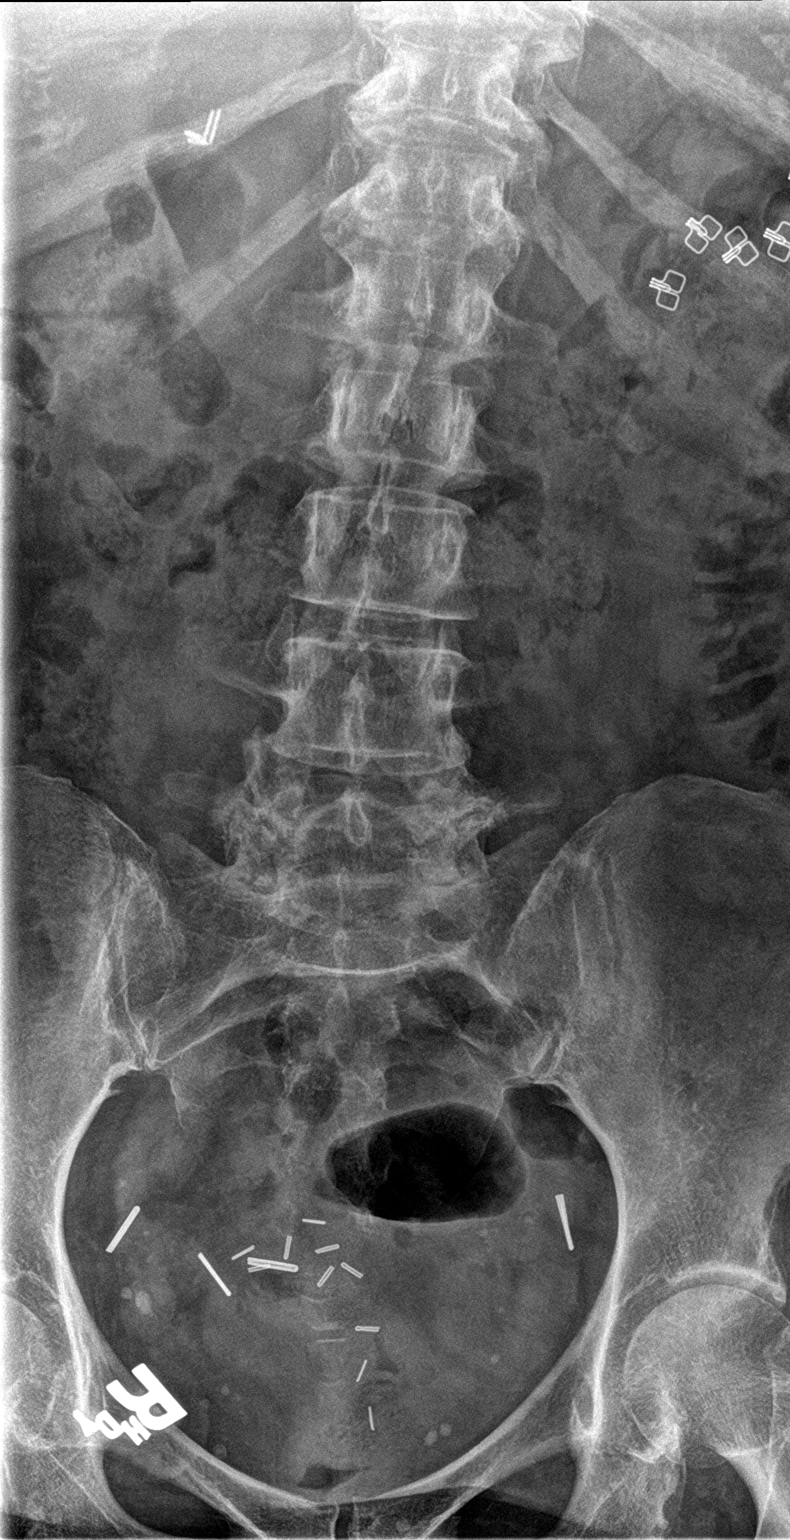

[l-spine lat]
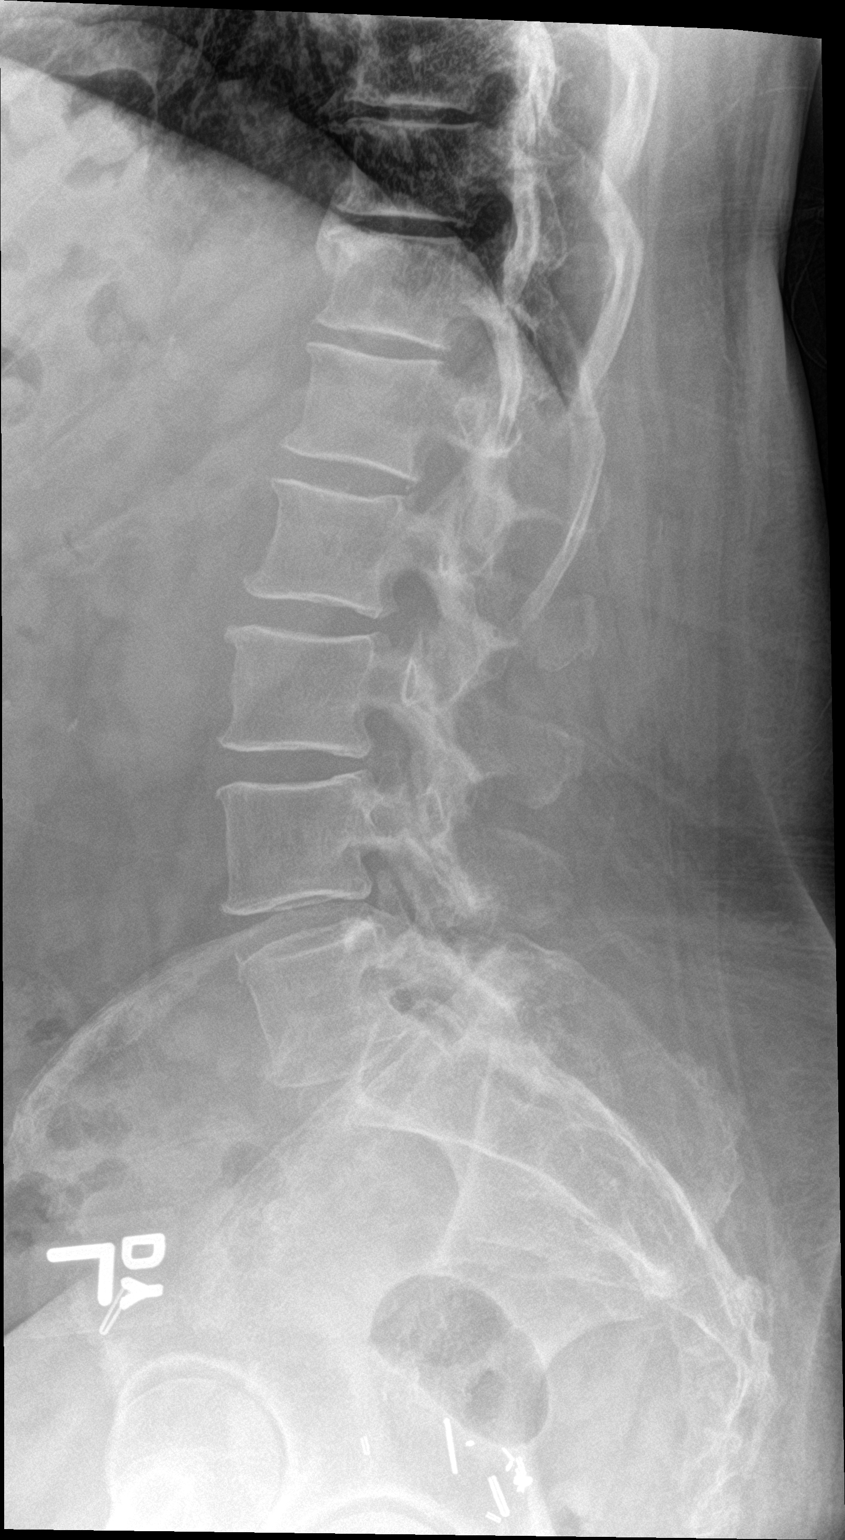

[l-spine spot]
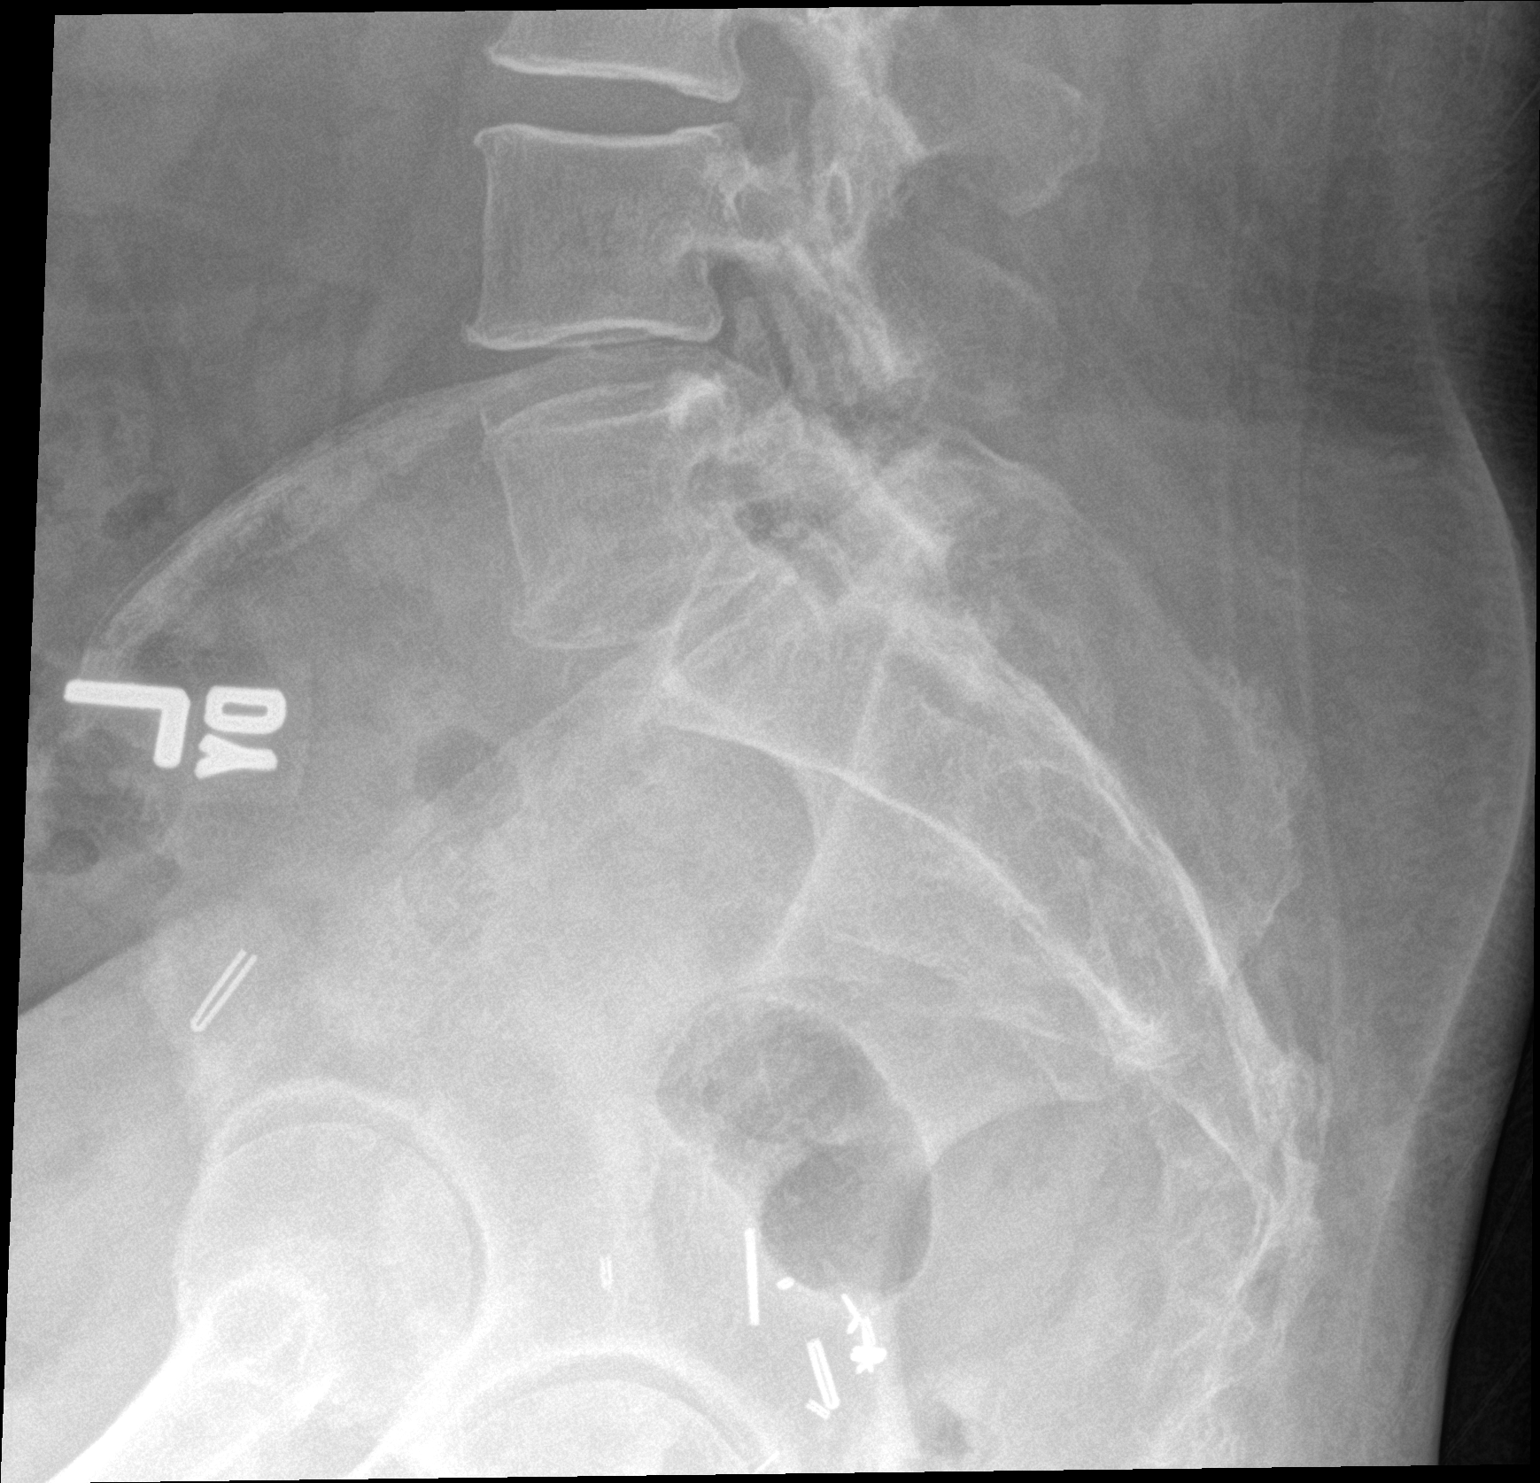

[3 of 3 positions shown; findings below may reference images not displayed]

FINDINGS: Grade 1 anterolisthesis L5-S1 unchanged. No pars defect. Remaining
alignment anatomic.

Mild disc degeneration throughout the lumbar spine with mild disc
space narrowing. Negative for fracture or mass. Facet degeneration
at L5-S1.
IMPRESSION: Degenerative listhesis L5-S1 is chronic.  No acute abnormality.

## 2019-10-13 NOTE — ED Provider Notes (Addendum)
Patient asleep on room entry.  She wakes up easily.  She is alert and oriented.  Reports she feels okay now, she is comfortable with plan to go home.  Reviewed her x-ray results with her.  She reports swelling to make sure that everything checked out okay she is feeling better.  Return precautions and treatment recommendations and follow-up discussed with the patient who is agreeable with the plan.    Delman Kitten, MD 10/13/19 0840    Dg Chest 2 View  Result Date: 10/13/2019 CLINICAL DATA:  Chest pain EXAM: CHEST - 2 VIEW COMPARISON:  Apr 24, 2019 FINDINGS: There is interstitial thickening bilaterally. There is no edema or consolidation. Heart size and pulmonary vascularity are within normal limits. No adenopathy. There is degenerative change in the thoracic spine. IMPRESSION: Interstitial thickening bilaterally. No edema or consolidation. Stable cardiac silhouette. Electronically Signed   By: Lowella Grip III M.D.   On: 10/13/2019 07:54   Dg Lumbar Spine 2-3 Views  Result Date: 10/13/2019 CLINICAL DATA:  Low back pain EXAM: LUMBAR SPINE - 2-3 VIEW COMPARISON:  04/16/2013 FINDINGS: Grade 1 anterolisthesis L5-S1 unchanged. No pars defect. Remaining alignment anatomic. Mild disc degeneration throughout the lumbar spine with mild disc space narrowing. Negative for fracture or mass. Facet degeneration at L5-S1. 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  No acute abnormality. Electronically Signed   By: Franchot Gallo M.D.   On: 10/13/2019 07:56   Dg Hip Unilat W Or Wo Pelvis 2-3 Views Right  Result Date: 10/13/2019 CLINICAL DATA:  Low back pain right hip pain EXAM: DG HIP (WITH OR WITHOUT PELVIS) 2-3V RIGHT COMPARISON:  None. FINDINGS: Mild degenerative change right hip with mild joint space narrowing and mild acetabular spurring. No fracture or AVN. Surgical clips in the pelvis. IMPRESSION: No acute abnormality.  Mild degenerative change right hip. Electronically Signed   By:  Franchot Gallo M.D.   On: 10/13/2019 07:55      Delman Kitten, MD 10/13/19 [phone]
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
## 2019-10-13 NOTE — ED Notes (Signed)
Resting quietly with eyes closed.

## 2019-10-13 NOTE — ED Notes (Signed)
Patient reports symptoms began on Monday - reports chronic hip pain but reports this is worse and going up her back.  Patient reports having chills also reports itching that has been chronic but reports now it is a stinging sensation and she can't sleep.  No acute distress noted, patient ambulatory with out any difficulty or distress noted.

## 2019-10-13 NOTE — Discharge Instructions (Signed)
You can take over-the-counter Tylenol or ibuprofen for your pain.  Return to the ER immediately for new, worsening, or persistent severe back, abdominal, or chest pain, shortness of breath, weakness or numbness, fever, vomiting, or any other new or worsening symptoms that concern you.

## 2019-10-13 NOTE — ED Triage Notes (Signed)
Patient to triage ambulatory via [facility] from home.  Per EMS patient reports complaints of - itching, hip pain, head and neck pain.  Patient ambulatory with slow steady gait, no obvious distress noted.

## 2019-10-15 ENCOUNTER — Other Ambulatory Visit: Payer: Self-pay | Admitting: Family Medicine

## 2019-10-15 DIAGNOSIS — M542 Cervicalgia: Secondary | ICD-10-CM

## 2019-10-28 ENCOUNTER — Ambulatory Visit: Payer: Medicare Other

## 2019-11-07 ENCOUNTER — Ambulatory Visit
Admission: RE | Admit: 2019-11-07 | Discharge: 2019-11-07 | Disposition: A | Discharge status: Home / Self Care | Payer: Medicare Other | Source: Ambulatory Visit | Attending: Family Medicine | Admitting: Family Medicine

## 2019-11-07 ENCOUNTER — Other Ambulatory Visit: Payer: Self-pay

## 2019-11-07 DIAGNOSIS — M542 Cervicalgia: Secondary | ICD-10-CM
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
## 2020-04-29 ENCOUNTER — Emergency Department
Admission: EM | Admit: 2020-04-29 | Discharge: 2020-04-30 | Disposition: A | Discharge status: Other Acute Inpatient Hospital | Payer: Medicare Other | Source: Home / Self Care | Attending: Student in an Organized Health Care Education/Training Program | Admitting: Student in an Organized Health Care Education/Training Program

## 2020-04-29 ENCOUNTER — Emergency Department: Payer: Medicare Other

## 2020-04-29 ENCOUNTER — Other Ambulatory Visit: Payer: Self-pay

## 2020-04-29 ENCOUNTER — Encounter: Payer: Self-pay | Admitting: Emergency Medicine

## 2020-04-29 DIAGNOSIS — Z79899 Other long term (current) drug therapy: Secondary | ICD-10-CM | POA: Insufficient documentation

## 2020-04-29 DIAGNOSIS — F23 Brief psychotic disorder: Secondary | ICD-10-CM | POA: Insufficient documentation

## 2020-04-29 DIAGNOSIS — Z7984 Long term (current) use of oral hypoglycemic drugs: Secondary | ICD-10-CM | POA: Insufficient documentation

## 2020-04-29 DIAGNOSIS — Z20822 Contact with and (suspected) exposure to covid-19: Secondary | ICD-10-CM | POA: Insufficient documentation

## 2020-04-29 DIAGNOSIS — F22 Delusional disorders: Secondary | ICD-10-CM | POA: Diagnosis not present

## 2020-04-29 DIAGNOSIS — E119 Type 2 diabetes mellitus without complications: Secondary | ICD-10-CM | POA: Insufficient documentation

## 2020-04-29 DIAGNOSIS — I10 Essential (primary) hypertension: Secondary | ICD-10-CM | POA: Insufficient documentation

## 2020-04-29 DIAGNOSIS — R440 Auditory hallucinations: Secondary | ICD-10-CM | POA: Diagnosis present

## 2020-04-29 DIAGNOSIS — J45909 Unspecified asthma, uncomplicated: Secondary | ICD-10-CM | POA: Insufficient documentation

## 2020-04-29 DIAGNOSIS — F209 Schizophrenia, unspecified: Secondary | ICD-10-CM | POA: Diagnosis not present

## 2020-04-29 LAB — URINALYSIS, COMPLETE (UACMP) WITH MICROSCOPIC
Bacteria, UA: NONE SEEN
Bilirubin Urine: NEGATIVE
Glucose, UA: >=500 mg/dL — AB
Hgb urine dipstick: NEGATIVE
Ketones, ur: NEGATIVE mg/dL
Leukocytes,Ua: NEGATIVE
Nitrite: NEGATIVE
Protein, ur: NEGATIVE mg/dL
Specific Gravity, Urine: 1.026 (ref 1.005–1.030)
pH: 6 (ref 5.0–8.0)

## 2020-04-29 LAB — CBC WITH DIFFERENTIAL/PLATELET
Abs Immature Granulocytes: 0.02 10*3/uL (ref 0.00–0.07)
Basophils Absolute: 0 10*3/uL (ref 0.0–0.1)
Basophils Relative: 0 %
Eosinophils Absolute: 0.1 10*3/uL (ref 0.0–0.5)
Eosinophils Relative: 1 %
HCT: 43.4 % (ref 36.0–46.0)
Hemoglobin: 13.8 g/dL (ref 12.0–15.0)
Immature Granulocytes: 0 %
Lymphocytes Relative: 41 %
Lymphs Abs: 3.6 10*3/uL (ref 0.7–4.0)
MCH: 25 pg — ABNORMAL LOW (ref 26.0–34.0)
MCHC: 31.8 g/dL (ref 30.0–36.0)
MCV: 78.6 fL — ABNORMAL LOW (ref 80.0–100.0)
Monocytes Absolute: 0.6 10*3/uL (ref 0.1–1.0)
Monocytes Relative: 6 %
Neutro Abs: 4.6 10*3/uL (ref 1.7–7.7)
Neutrophils Relative %: 52 %
Platelets: 306 10*3/uL (ref 150–400)
RBC: 5.52 MIL/uL — ABNORMAL HIGH (ref 3.87–5.11)
RDW: 15 % (ref 11.5–15.5)
WBC: 8.9 10*3/uL (ref 4.0–10.5)
nRBC: 0 % (ref 0.0–0.2)

## 2020-04-29 LAB — COMPREHENSIVE METABOLIC PANEL
ALT: 21 U/L (ref 0–44)
AST: 27 U/L (ref 15–41)
Albumin: 3.4 g/dL — ABNORMAL LOW (ref 3.5–5.0)
Alkaline Phosphatase: 97 U/L (ref 38–126)
Anion gap: 9 (ref 5–15)
BUN: 14 mg/dL (ref 8–23)
CO2: 27 mmol/L (ref 22–32)
Calcium: 9.8 mg/dL (ref 8.9–10.3)
Chloride: 102 mmol/L (ref 98–111)
Creatinine, Ser: 0.6 mg/dL (ref 0.44–1.00)
GFR calc Af Amer: >60 mL/min (ref >60)
GFR calc non Af Amer: >60 mL/min (ref >60)
Glucose, Bld: 175 mg/dL — ABNORMAL HIGH (ref 70–99)
Potassium: 3.1 mmol/L — ABNORMAL LOW (ref 3.5–5.1)
Sodium: 138 mmol/L (ref 135–145)
Total Bilirubin: 0.4 mg/dL (ref 0.3–1.2)
Total Protein: 7.4 g/dL (ref 6.5–8.1)

## 2020-04-29 LAB — URINE DRUG SCREEN, QUALITATIVE (ARMC ONLY)
Amphetamines, Ur Screen: NOT DETECTED
Barbiturates, Ur Screen: NOT DETECTED
Benzodiazepine, Ur Scrn: NOT DETECTED
Cannabinoid 50 Ng, Ur ~~LOC~~: NOT DETECTED
Cocaine Metabolite,Ur ~~LOC~~: NOT DETECTED
MDMA (Ecstasy)Ur Screen: NOT DETECTED
Methadone Scn, Ur: NOT DETECTED
Opiate, Ur Screen: NOT DETECTED
Phencyclidine (PCP) Ur S: NOT DETECTED
Tricyclic, Ur Screen: NOT DETECTED

## 2020-04-29 LAB — ETHANOL: Alcohol, Ethyl (B): <10 mg/dL (ref <10)

## 2020-04-29 LAB — SALICYLATE LEVEL: Salicylate Lvl: <7 mg/dL — ABNORMAL LOW (ref 7.0–30.0)

## 2020-04-29 LAB — ACETAMINOPHEN LEVEL: Acetaminophen (Tylenol), Serum: <10 ug/mL — ABNORMAL LOW (ref 10–30)

## 2020-04-29 IMAGING — CT CT HEAD W/O CM
4 series · 15 of 47 positions shown, 17 images · non-contrast
Comparison: CT head [DATE]

CLINICAL DATA: Altered mental status, hallucinations

EXAM:
CT HEAD WITHOUT CONTRAST
TECHNIQUE: Contiguous axial images were obtained from the base of the skull
through the vertex without intravenous contrast.

[Series 2: head wo · axial · 0.44mm/px · z∈[-182,-77]mm · 7 of 29 slices shown, 9 images]
[im 4/29  brain]
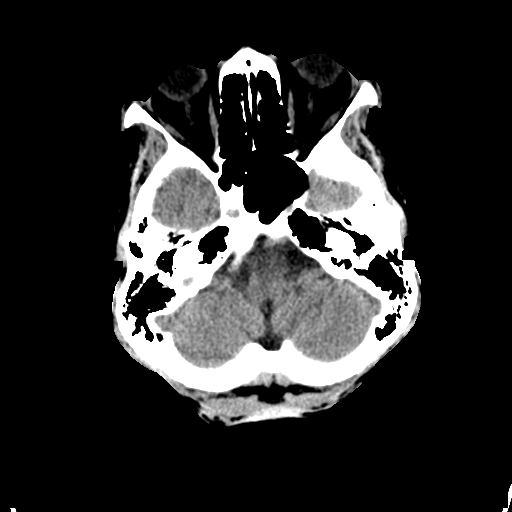
[im 4/29  bone]
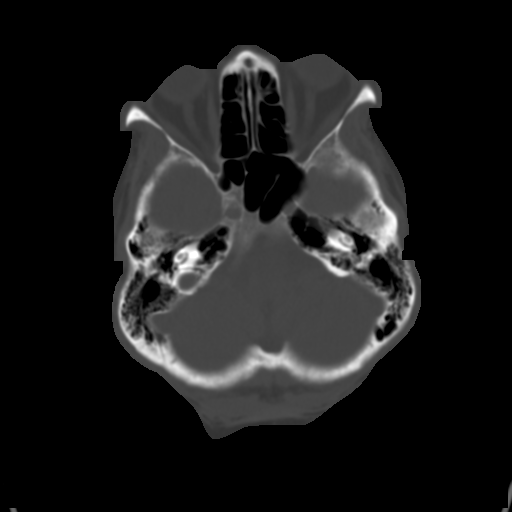
[im 8/29  brain]
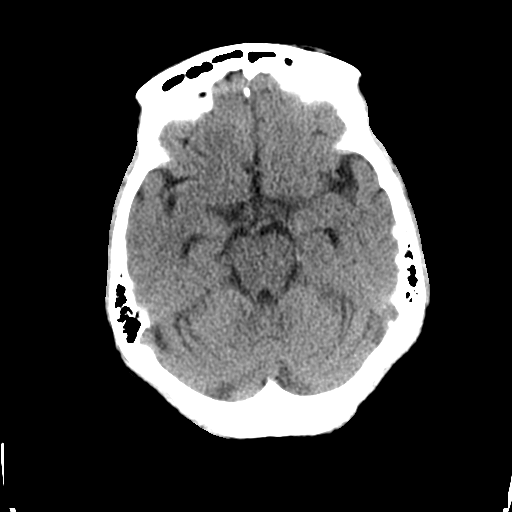
[im 11/29  brain]
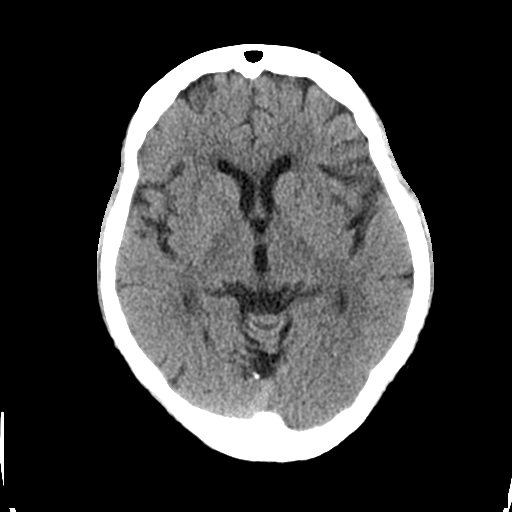
[im 15/29  brain]
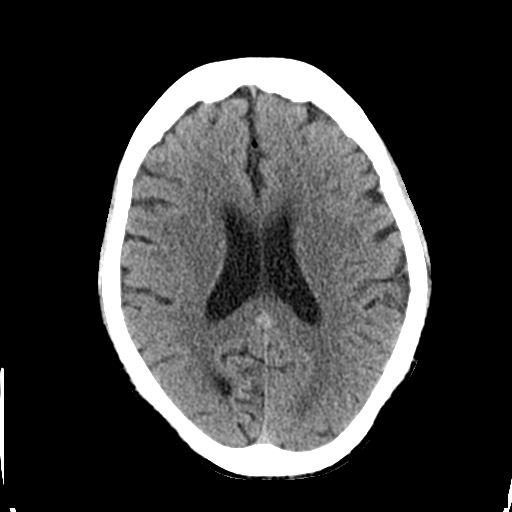
[im 18/29  brain]
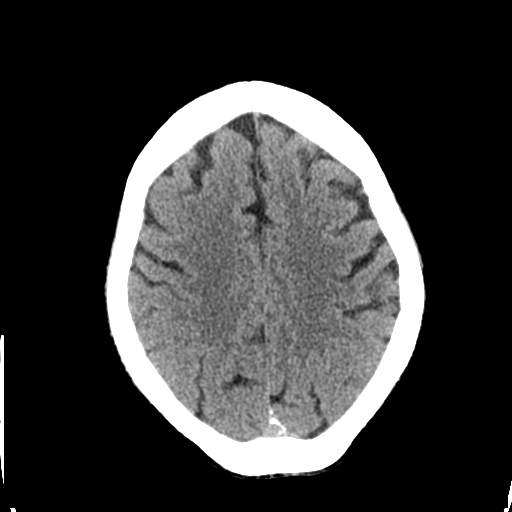
[im 18/29  bone]
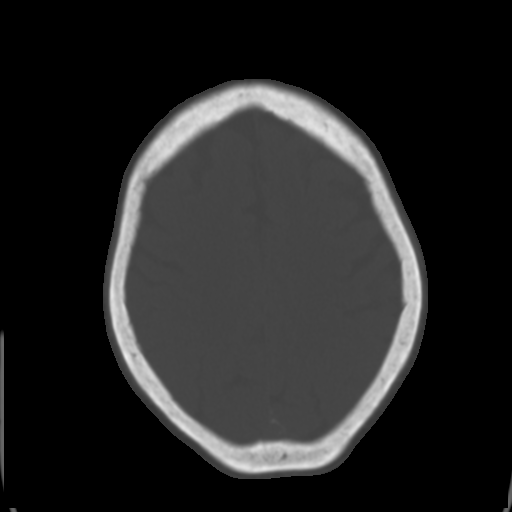
[im 22/29  brain]
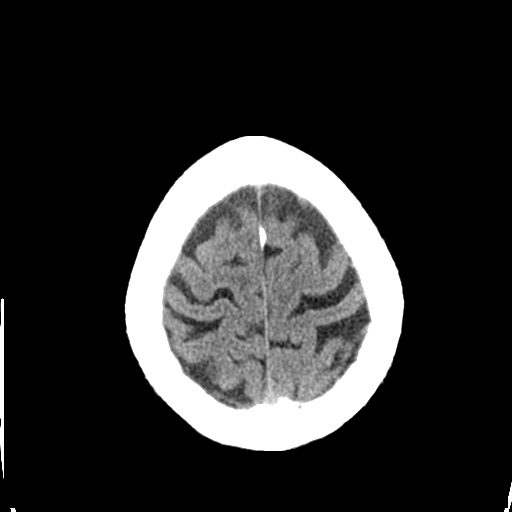
[im 25/29  brain]
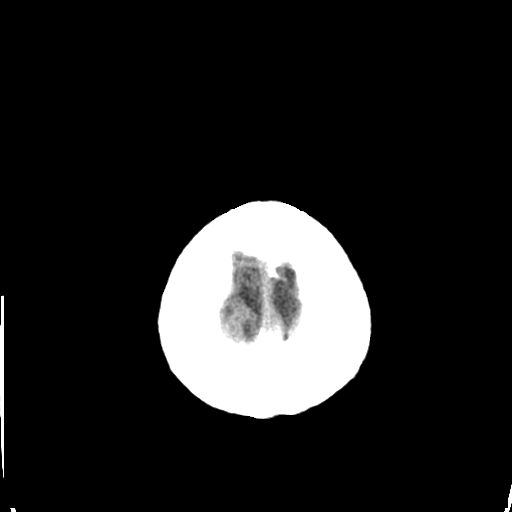

[Series 3: head bone · axial · 0.44mm/px · z∈[-183,-169]mm · 2 of 72 slices shown]
[im 8/72  bone]
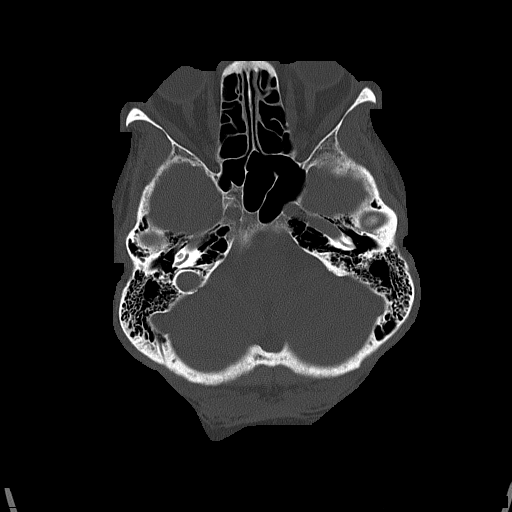
[im 15/72  bone]
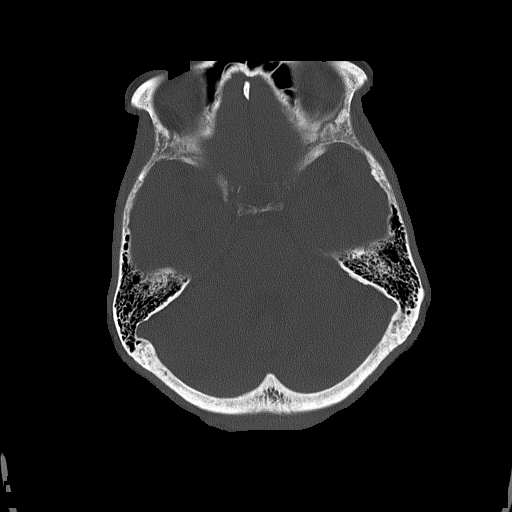

[Series 4: coronal soft tissue · coronal · 0.29mm/px · 3 of 65 slices shown]
[im 22/65  brain]
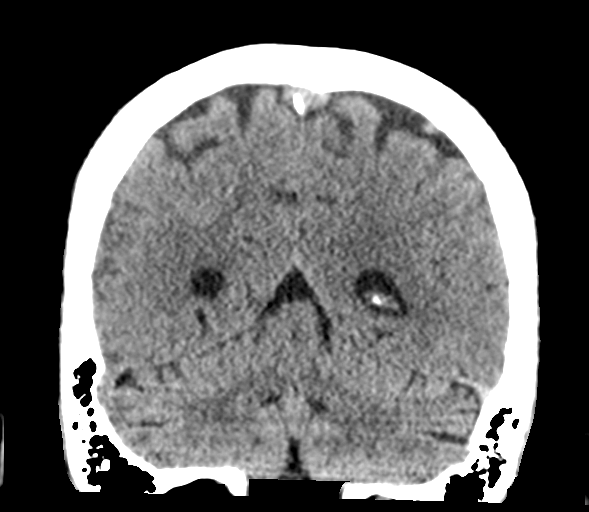
[im 29/65  brain]
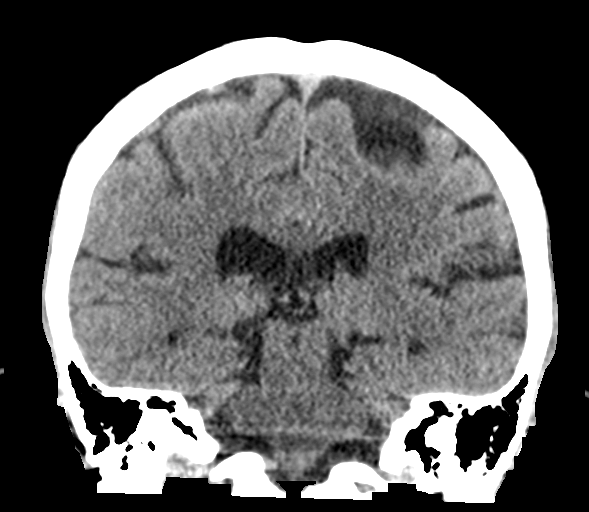
[im 36/65  brain]
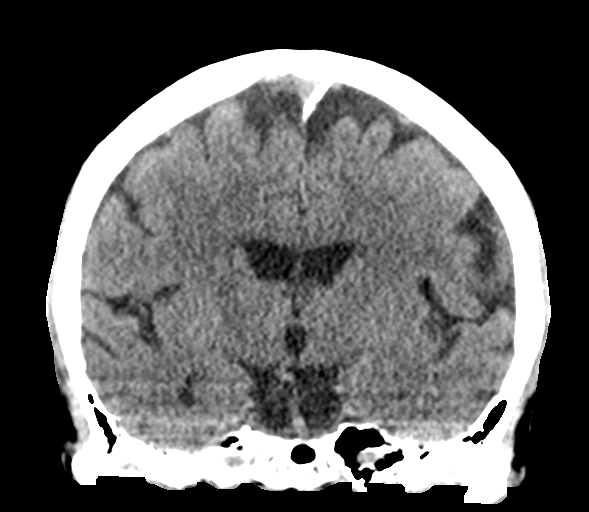

[Series 5: sagittal soft tissue · sagittal · 0.29mm/px · 3 of 53 slices shown]
[im 18/53  brain]
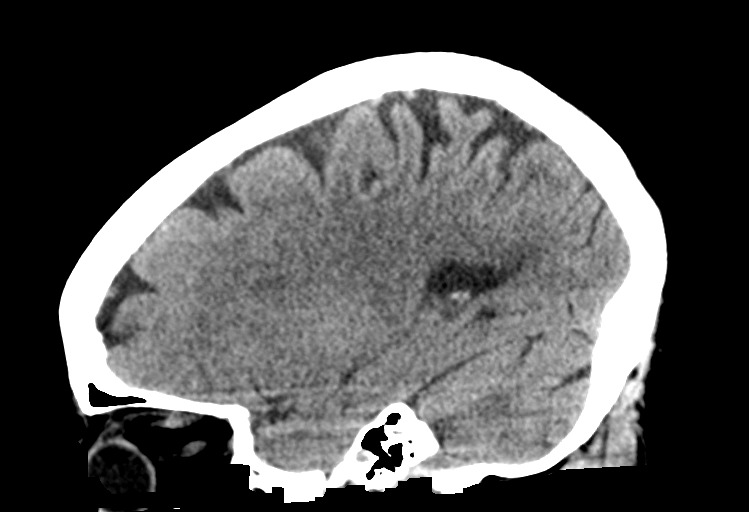
[im 27/53  brain]
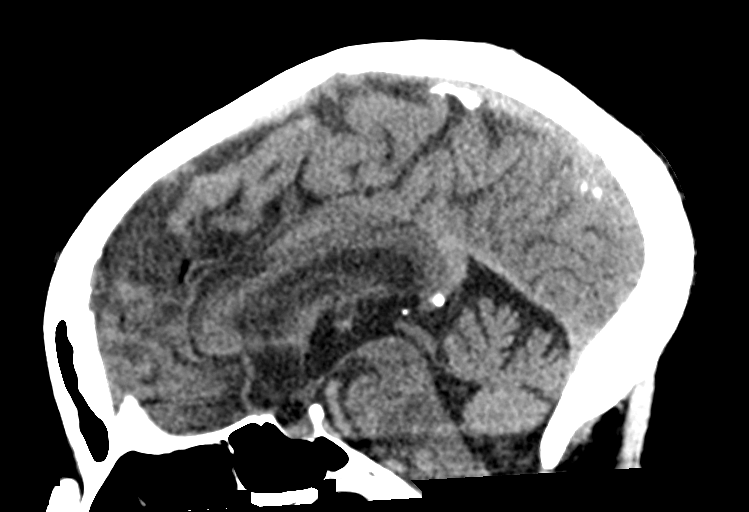
[im 35/53  brain]
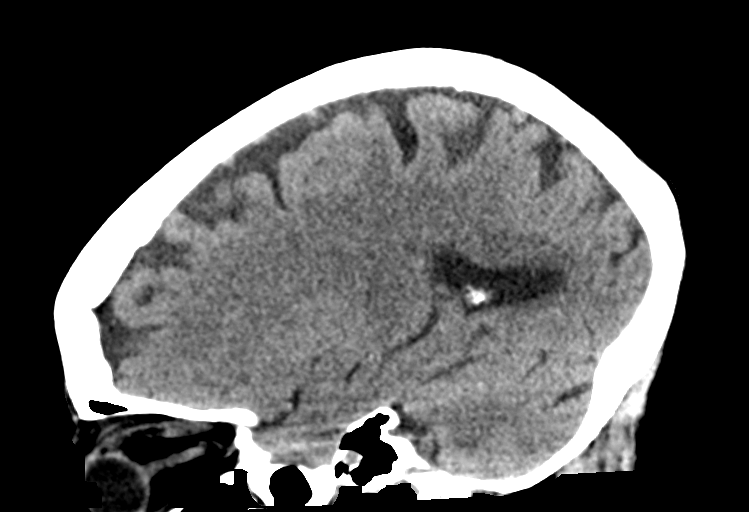

[15 of 47 positions shown; findings below may reference images not displayed]

FINDINGS: Brain: No evidence of acute infarction, hemorrhage, hydrocephalus,
extra-axial collection or mass lesion/mass effect. Symmetric
prominence of the ventricles, cisterns and sulci compatible with
parenchymal volume loss. Patchy areas of white matter
hypoattenuation are most compatible with chronic microvascular
angiopathy. Stable benign dural calcifications.

Vascular: Atherosclerotic calcification of the carotid siphons. No
hyperdense vessel.

Skull: No calvarial fracture or suspicious osseous lesion. No scalp
swelling or hematoma.

Sinuses/Orbits: Paranasal sinuses and mastoid air cells are
predominantly clear. Included orbital structures are unremarkable.

Other: Subcutaneous calcification anterior to the nasal bridge may
reflect sequela of old trauma, infection, tricholemmal, dermal
inclusion or sebaceous cyst.
IMPRESSION: 1. No acute intracranial abnormality.
2. Stable parenchymal volume loss and chronic microvascular
angiopathy.
3. Stable subcutaneous calcification anterior to the nasal bridge
may reflect sequela of old trauma, infection, tricholemmal cyst,
dermal inclusion or sebaceous cyst.

## 2020-04-29 NOTE — ED Notes (Signed)
Pt. Was given a cup of ice water.
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
## 2020-04-29 NOTE — ED Notes (Signed)
Pt states she did not eat dinner, pt provided [place] sandwich tray and sprite

## 2020-04-29 NOTE — ED Provider Notes (Addendum)
[facility] Emergency Department Provider Note    First MD Initiated Contact with Patient 04/29/20 2202     (approximate)  I have reviewed the triage vital signs and the nursing notes.   HISTORY  Chief Complaint paranoia   HPI Marilyn Weber is a 74 y.o. female   the post past medical history presents to the ER for paranoia. Patient very odd. States that she is on "a bunch of mood medications "cannot remember the names. States that she feels like people or around "pricking her ". Patient has, both in her ears because she is hearing voices. She denies any SI or HI. Denies any pain.     Past Medical History:  Diagnosis Date  . Asthma   . Diabetes mellitus without complication ([place])   . Hypertension    Family History  Problem Relation Age of Onset  . Lung cancer Brother   . Breast cancer Neg Hx    Past Surgical History:  Procedure Laterality Date  . ABDOMINAL HYSTERECTOMY    . BREAST CYST ASPIRATION Bilateral    There are no problems to display for this patient.     Prior to Admission medications   Medication Sig Start Date End Date Taking? Authorizing Provider  amLODipine (NORVASC) 10 MG tablet Take 10 mg by mouth daily.    [provider]  citalopram (CELEXA) 20 MG tablet Take 20 mg by mouth daily.    [provider]  cyclobenzaprine (FLEXERIL) 10 MG tablet Take 1 tablet (10 mg total) by mouth 3 (three) times daily as needed for muscle spasms. 04/24/19   Earleen Newport, MD  Elastic Bandages & Supports ([place]) MISC Please provide 80mmHg compression stockings 05/18/18   Harvest Dark, MD  empagliflozin (JARDIANCE) 10 MG TABS tablet Take 10 mg by mouth daily.    [provider]  furosemide (LASIX) 20 MG tablet Take 1 tablet (20 mg total) by mouth daily for 7 days. 05/18/18 05/25/18  Harvest Dark, MD  gabapentin (NEURONTIN) 300 MG capsule Take 600 mg by mouth 2 (two) times daily.     [provider]  glipiZIDE (GLUCOTROL XL) 10 MG 24 hr tablet Take 10 mg by mouth daily.    [provider]  hydrochlorothiazide (HYDRODIURIL) 25 MG tablet Take 25 mg by mouth daily.    [provider]  hydrOXYzine (ATARAX/VISTARIL) 50 MG tablet Take 50 mg by mouth 3 (three) times daily as needed.    [provider]  meloxicam (MOBIC) 7.5 MG tablet Take 7.5 mg by mouth daily.    [provider]  pregabalin (LYRICA) 50 MG capsule Take 50 mg by mouth 3 (three) times daily.    [provider]  rOPINIRole (REQUIP) 1 MG tablet Take 1 mg by mouth at bedtime.    [provider]  traMADol (ULTRAM) 50 MG tablet Take 50 mg by mouth 2 (two) times daily as needed for moderate pain.    [provider]  traZODone (DESYREL) 50 MG tablet Take 50 mg by mouth at bedtime as needed for sleep.    [provider]    Allergies Lisinopril and Penicillins    Social History Social History   Tobacco Use  . Smoking status: Never Smoker  . Smokeless tobacco: Never Used  Substance Use Topics  . Alcohol use: Never  . Drug use: Not on file    Review of Systems Patient denies headaches, rhinorrhea, blurry vision, numbness, shortness of breath, chest  pain, edema, cough, abdominal pain, nausea, vomiting, diarrhea, dysuria, fevers, rashes or hallucinations unless otherwise stated above in HPI. ____________________________________________   PHYSICAL EXAM:  VITAL SIGNS: Vitals:   04/29/20 2057  BP: (!) 178/100  Pulse: (!) 110  Resp: 18  Temp: 97.8 F (36.6 C)  SpO2: 98%    Constitutional: Alert and oriented.  Eyes: Conjunctivae are normal.  Head: Atraumatic. Nose: No congestion/rhinnorhea. Mouth/Throat: Mucous membranes are moist.   Neck: No stridor. Painless ROM.  Cardiovascular: Normal rate, regular rhythm. Grossly normal heart sounds.  Good peripheral circulation. Respiratory: Normal respiratory effort.  No retractions.  Lungs CTAB. Gastrointestinal: Soft and nontender. No distention. No abdominal bruits. No CVA tenderness. Genitourinary:  Musculoskeletal: No lower extremity tenderness nor edema.  No joint effusions. Neurologic:  Normal speech and language. No gross focal neurologic deficits are appreciated. No facial droop Skin:  Skin is warm, dry and intact. No rash noted. Psychiatric:  Pressured speech, cooperative but very odd.  No SI or HI,    ____________________________________________   LABS (all labs ordered are listed, but only abnormal results are displayed)  Results for orders placed or performed during the hospital encounter of 04/29/20 (from the past 24 hour(s))  CBC with Differential     Status: Abnormal   Collection Time: 04/29/20  9:07 PM  Result Value Ref Range   WBC 8.9 4.0 - 10.5 K/uL   RBC 5.52 (H) 3.87 - 5.11 MIL/uL   Hemoglobin 13.8 12.0 - 15.0 g/dL   HCT 22.0 25.4 - 27.0 %   MCV 78.6 (L) 80.0 - 100.0 fL   MCH 25.0 (L) 26.0 - 34.0 pg   MCHC 31.8 30.0 - 36.0 g/dL   RDW 62.3 76.2 - 83.1 %   Platelets 306 150 - 400 K/uL   nRBC 0.0 0.0 - 0.2 %   Neutrophils Relative % 52 %   Neutro Abs 4.6 1.7 - 7.7 K/uL   Lymphocytes Relative 41 %   Lymphs Abs 3.6 0.7 - 4.0 K/uL   Monocytes Relative 6 %   Monocytes Absolute 0.6 0.1 - 1.0 K/uL   Eosinophils Relative 1 %   Eosinophils Absolute 0.1 0.0 - 0.5 K/uL   Basophils Relative 0 %   Basophils Absolute 0.0 0.0 - 0.1 K/uL   Immature Granulocytes 0 %   Abs Immature Granulocytes 0.02 0.00 - 0.07 K/uL  Comprehensive metabolic panel     Status: Abnormal   Collection Time: 04/29/20  9:07 PM  Result Value Ref Range   Sodium 138 135 - 145 mmol/L   Potassium 3.1 (L) 3.5 - 5.1 mmol/L   Chloride 102 98 - 111 mmol/L   CO2 27 22 - 32 mmol/L   Glucose, Bld 175 (H) 70 - 99 mg/dL   BUN 14 8 - 23 mg/dL   Creatinine, Ser 5.17 0.44 - 1.00 mg/dL   Calcium 9.8 8.9 - 61.6 mg/dL   Total Protein 7.4 6.5 - 8.1 g/dL   Albumin 3.4 (L) 3.5 - 5.0 g/dL    AST 27 15 - 41 U/L   ALT 21 0 - 44 U/L   Alkaline Phosphatase 97 38 - 126 U/L   Total Bilirubin 0.4 0.3 - 1.2 mg/dL   GFR calc non Af Amer >60 >60 mL/min   GFR calc Af Amer >60 >60 mL/min   Anion gap 9 5 - 15  Ethanol     Status: None   Collection Time: 04/29/20  9:07 PM  Result Value Ref Range   Alcohol,  Ethyl (B) <10 <10 mg/dL  Salicylate level     Status: Abnormal   Collection Time: 04/29/20  9:07 PM  Result Value Ref Range   Salicylate Lvl <7.0 (L) 7.0 - 30.0 mg/dL  Acetaminophen level     Status: Abnormal   Collection Time: 04/29/20  9:07 PM  Result Value Ref Range   Acetaminophen (Tylenol), Serum <10 (L) 10 - 30 ug/mL  Urinalysis, Complete w Microscopic     Status: Abnormal   Collection Time: 04/29/20  9:09 PM  Result Value Ref Range   Color, Urine YELLOW (A) YELLOW   APPearance CLEAR (A) CLEAR   Specific Gravity, Urine 1.026 1.005 - 1.030   pH 6.0 5.0 - 8.0   Glucose, UA >=500 (A) NEGATIVE mg/dL   Hgb urine dipstick NEGATIVE NEGATIVE   Bilirubin Urine NEGATIVE NEGATIVE   Ketones, ur NEGATIVE NEGATIVE mg/dL   Protein, ur NEGATIVE NEGATIVE mg/dL   Nitrite NEGATIVE NEGATIVE   Leukocytes,Ua NEGATIVE NEGATIVE   RBC / HPF 0-5 0 - 5 RBC/hpf   WBC, UA 0-5 0 - 5 WBC/hpf   Bacteria, UA NONE SEEN NONE SEEN   Squamous Epithelial / LPF 0-5 0 - 5  Urine Drug Screen, Qualitative ([facility] only)     Status: None   Collection Time: 04/29/20  9:09 PM  Result Value Ref Range   Tricyclic, Ur Screen NONE DETECTED NONE DETECTED   Amphetamines, Ur Screen NONE DETECTED NONE DETECTED   MDMA (Ecstasy)Ur Screen NONE DETECTED NONE DETECTED   Cocaine Metabolite,Ur [place] NONE DETECTED NONE DETECTED   Opiate, Ur Screen NONE DETECTED NONE DETECTED   Phencyclidine (PCP) Ur S NONE DETECTED NONE DETECTED   Cannabinoid 50 Ng, Ur [place] NONE DETECTED NONE DETECTED   Barbiturates, Ur Screen NONE DETECTED NONE DETECTED   Benzodiazepine, Ur Scrn NONE DETECTED NONE DETECTED   Methadone Scn, Ur NONE DETECTED NONE  DETECTED   ____________________________________________  ED ECG REPORT I, Willy Eddy, the attending physician, personally viewed and interpreted this ECG.   Date: 04/30/2020  EKG Time: 00:30  Rate: 90  Rhythm: sinus  Axis: normal  Intervals: normal intervals  ST&T Change: nonspecific st abn, no stemi ____________________________________________  RADIOLOGY  I personally reviewed all radiographic images ordered to evaluate for the above acute complaints and reviewed radiology reports and findings.  These findings were personally discussed with the patient.  Please see medical record for radiology report.  ____________________________________________   PROCEDURES  Procedure(s) performed:  Procedures    Critical Care performed: no ____________________________________________   INITIAL IMPRESSION / ASSESSMENT AND PLAN / ED COURSE  Pertinent labs & imaging results that were available during my care of the patient were reviewed by me and considered in my medical decision making (see chart for details).   DDX: Psychosis, delirium, medication effect, noncompliance, polysubstance abuse, Si, Hi, depression   Marilyn Weber is a 74 y.o. who presents to the ED with for evaluation of paranoia.  Patient reports being on "mood medications" but I don't see any h/o psychiatric admission.  Laboratory testing was ordered to evaluation for underlying electrolyte derangement or signs of underlying organic pathology to explain today's presentation.  Based on history and physical and laboratory evaluation, it appears that the patient's presentation is 2/2 underlying psychiatric disorder and will require further evaluation and management by inpatient psychiatry.   The patient has been placed in psychiatric observation due to the need to provide a safe environment for the patient while obtaining psychiatric consultation and evaluation, as  well as ongoing medical and medication management to treat  the patient's condition.  The patient has not been placed under full IVC at this time.      The patient was evaluated in Emergency Department today for the symptoms described in the history of present illness. He/she was evaluated in the context of the global COVID-19 pandemic, which necessitated consideration that the patient might be at risk for infection with the SARS-CoV-2 virus that causes COVID-19. Institutional protocols and algorithms that pertain to the evaluation of patients at risk for COVID-19 are in a state of rapid change based on information released by regulatory bodies including the CDC and federal and state organizations. These policies and algorithms were followed during the patient's care in the ED.  As part of my medical decision making, I reviewed the following data within the electronic MEDICAL RECORD NUMBER Nursing notes reviewed and incorporated, Labs reviewed, notes from prior ED visits and [place] Controlled Substance Database   ____________________________________________   FINAL CLINICAL IMPRESSION(S) / ED DIAGNOSES  Final diagnoses:  Paranoia (HCC)      NEW MEDICATIONS STARTED DURING THIS VISIT:  New Prescriptions   No medications on file     Note:  This document was prepared using Dragon voice recognition software and may include unintentional dictation errors.     Willy Eddy, MD 04/29/20 2346    Willy Eddy, MD 05/15/20 2224

## 2020-04-29 NOTE — ED Notes (Addendum)
With this nurse and EDT Gabby present; pt removes print scrub top, white bra, green scrubs pants, black tennis shoes, floral print panties, brown hair bonnet, black purse--all placed in labeled pt belonging bag to be secured on nursing unit and pt changed into burgandy scrubs; pt has 1 $100 bill, 5 $20 bills, 1 $10 bill, 1 $5 bill, and 6 $1 bill--will place in pt valuables envelope & given to hospital security to secure

## 2020-04-30 ENCOUNTER — Inpatient Hospital Stay
Admit: 2020-04-30 | Discharge: 2020-05-05 | DRG: 885 | Disposition: A | Discharge status: Home / Self Care | Payer: Medicare Other | Source: Ambulatory Visit | Attending: Psychiatry | Admitting: Psychiatry

## 2020-04-30 ENCOUNTER — Encounter: Payer: Self-pay | Admitting: Behavioral Health

## 2020-04-30 DIAGNOSIS — F329 Major depressive disorder, single episode, unspecified: Secondary | ICD-10-CM | POA: Diagnosis present

## 2020-04-30 DIAGNOSIS — F209 Schizophrenia, unspecified: Secondary | ICD-10-CM | POA: Diagnosis present

## 2020-04-30 DIAGNOSIS — F22 Delusional disorders: Secondary | ICD-10-CM | POA: Diagnosis present

## 2020-04-30 DIAGNOSIS — I10 Essential (primary) hypertension: Secondary | ICD-10-CM | POA: Diagnosis present

## 2020-04-30 DIAGNOSIS — Z880 Allergy status to penicillin: Secondary | ICD-10-CM | POA: Diagnosis not present

## 2020-04-30 DIAGNOSIS — G609 Hereditary and idiopathic neuropathy, unspecified: Secondary | ICD-10-CM | POA: Diagnosis present

## 2020-04-30 DIAGNOSIS — Z79899 Other long term (current) drug therapy: Secondary | ICD-10-CM

## 2020-04-30 DIAGNOSIS — Z791 Long term (current) use of non-steroidal anti-inflammatories (NSAID): Secondary | ICD-10-CM

## 2020-04-30 DIAGNOSIS — R440 Auditory hallucinations: Secondary | ICD-10-CM | POA: Diagnosis present

## 2020-04-30 DIAGNOSIS — Z888 Allergy status to other drugs, medicaments and biological substances status: Secondary | ICD-10-CM

## 2020-04-30 DIAGNOSIS — F419 Anxiety disorder, unspecified: Secondary | ICD-10-CM | POA: Diagnosis present

## 2020-04-30 DIAGNOSIS — Z794 Long term (current) use of insulin: Secondary | ICD-10-CM | POA: Diagnosis not present

## 2020-04-30 DIAGNOSIS — E0590 Thyrotoxicosis, unspecified without thyrotoxic crisis or storm: Secondary | ICD-10-CM | POA: Diagnosis present

## 2020-04-30 DIAGNOSIS — F200 Paranoid schizophrenia: Secondary | ICD-10-CM

## 2020-04-30 DIAGNOSIS — G2581 Restless legs syndrome: Secondary | ICD-10-CM | POA: Diagnosis present

## 2020-04-30 DIAGNOSIS — G894 Chronic pain syndrome: Secondary | ICD-10-CM | POA: Diagnosis present

## 2020-04-30 DIAGNOSIS — E1110 Type 2 diabetes mellitus with ketoacidosis without coma: Secondary | ICD-10-CM | POA: Diagnosis present

## 2020-04-30 DIAGNOSIS — R7989 Other specified abnormal findings of blood chemistry: Secondary | ICD-10-CM | POA: Diagnosis present

## 2020-04-30 DIAGNOSIS — F203 Undifferentiated schizophrenia: Secondary | ICD-10-CM | POA: Diagnosis not present

## 2020-04-30 DIAGNOSIS — J45909 Unspecified asthma, uncomplicated: Secondary | ICD-10-CM | POA: Diagnosis present

## 2020-04-30 DIAGNOSIS — E1142 Type 2 diabetes mellitus with diabetic polyneuropathy: Secondary | ICD-10-CM | POA: Diagnosis present

## 2020-04-30 DIAGNOSIS — Z20822 Contact with and (suspected) exposure to covid-19: Secondary | ICD-10-CM | POA: Diagnosis present

## 2020-04-30 DIAGNOSIS — Z801 Family history of malignant neoplasm of trachea, bronchus and lung: Secondary | ICD-10-CM | POA: Diagnosis not present

## 2020-04-30 LAB — SARS CORONAVIRUS 2 BY RT PCR (HOSPITAL ORDER, PERFORMED IN ~~LOC~~ HOSPITAL LAB): SARS Coronavirus 2: NEGATIVE

## 2020-04-30 LAB — TSH: TSH: <0.01 u[IU]/mL — ABNORMAL LOW (ref 0.350–4.500)

## 2020-04-30 LAB — VITAMIN B12: Vitamin B-12: 143 pg/mL — ABNORMAL LOW (ref 180–914)

## 2020-04-30 LAB — BRAIN NATRIURETIC PEPTIDE: B Natriuretic Peptide: 117 pg/mL — ABNORMAL HIGH (ref 0.0–100.0)

## 2020-04-30 LAB — T4, FREE: Free T4: 2.58 ng/dL — ABNORMAL HIGH (ref 0.61–1.12)

## 2020-04-30 LAB — FOLATE: Folate: 10.9 ng/mL (ref >5.9)

## 2020-04-30 LAB — GLUCOSE, CAPILLARY
Glucose-Capillary: 157 mg/dL — ABNORMAL HIGH (ref 70–99)
Glucose-Capillary: 129 mg/dL — ABNORMAL HIGH (ref 70–99)

## 2020-04-30 LAB — HEMOGLOBIN A1C
Hgb A1c MFr Bld: 6.2 % — ABNORMAL HIGH (ref 4.8–5.6)
Mean Plasma Glucose: 131.24 mg/dL

## 2020-04-30 MED ORDER — GLIPIZIDE ER 5 MG PO TB24
10.0000 mg | ORAL_TABLET | Freq: Every day | ORAL | Status: DC
  Administered 2020-04-30 – 2020-05-01 (×2): 10 mg via ORAL
  Filled 2020-04-30 (×2): qty 2

## 2020-04-30 MED ORDER — HALOPERIDOL 1 MG PO TABS
2.0000 mg | ORAL_TABLET | Freq: Every day | ORAL | Status: DC
  Administered 2020-04-30 – 2020-05-02 (×3): 2 mg via ORAL
  Filled 2020-04-30 (×3): qty 2

## 2020-04-30 MED ORDER — FUROSEMIDE 20 MG PO TABS
20.0000 mg | ORAL_TABLET | Freq: Every day | ORAL | Status: DC
  Filled 2020-04-30: qty 1

## 2020-04-30 MED ORDER — AMLODIPINE BESYLATE 5 MG PO TABS: 5.0000 mg | ORAL_TABLET | Freq: Every day | ORAL | Status: DC

## 2020-04-30 MED ORDER — HYDROCHLOROTHIAZIDE 25 MG PO TABS
25.0000 mg | ORAL_TABLET | Freq: Every day | ORAL | Status: DC
  Administered 2020-04-30 – 2020-05-05 (×6): 25 mg via ORAL
  Filled 2020-04-30 (×6): qty 1

## 2020-04-30 MED ORDER — POTASSIUM CHLORIDE CRYS ER 20 MEQ PO TBCR
20.0000 meq | EXTENDED_RELEASE_TABLET | Freq: Once | ORAL | Status: AC
  Administered 2020-04-30: 20 meq via ORAL
  Filled 2020-04-30: qty 1

## 2020-04-30 MED ORDER — GLIPIZIDE 10 MG PO TABS: 10.0000 mg | ORAL_TABLET | Freq: Every day | ORAL | Status: DC

## 2020-04-30 MED ORDER — TRAMADOL HCL 50 MG PO TABS
50.0000 mg | ORAL_TABLET | Freq: Three times a day (TID) | ORAL | Status: DC | PRN
  Administered 2020-05-01 – 2020-05-05 (×3): 50 mg via ORAL
  Filled 2020-04-30 (×4): qty 1

## 2020-04-30 MED ORDER — HALOPERIDOL 5 MG PO TABS
5.0000 mg | ORAL_TABLET | Freq: Every day | ORAL | Status: DC
  Filled 2020-04-30: qty 1

## 2020-04-30 MED ORDER — MAGNESIUM HYDROXIDE 400 MG/5ML PO SUSP: 30.0000 mL | Freq: Every day | ORAL | Status: DC | PRN

## 2020-04-30 MED ORDER — ALUM & MAG HYDROXIDE-SIMETH 200-200-20 MG/5ML PO SUSP: 30.0000 mL | ORAL | Status: DC | PRN

## 2020-04-30 MED ORDER — ROPINIROLE HCL 1 MG PO TABS
1.0000 mg | ORAL_TABLET | Freq: Every day | ORAL | Status: DC
  Administered 2020-05-01 – 2020-05-02 (×2): 1 mg via ORAL
  Filled 2020-04-30 (×5): qty 1

## 2020-04-30 MED ORDER — CITALOPRAM HYDROBROMIDE 20 MG PO TABS: 20.0000 mg | ORAL_TABLET | Freq: Every day | ORAL | Status: DC

## 2020-04-30 MED ORDER — INSULIN GLARGINE 100 UNIT/ML ~~LOC~~ SOLN
20.0000 [IU] | Freq: Every day | SUBCUTANEOUS | Status: DC
  Filled 2020-04-30 (×3): qty 0.2

## 2020-04-30 MED ORDER — GABAPENTIN 300 MG PO CAPS: 600.0000 mg | ORAL_CAPSULE | Freq: Two times a day (BID) | ORAL | Status: DC

## 2020-04-30 MED ORDER — ACETAMINOPHEN 325 MG PO TABS
650.0000 mg | ORAL_TABLET | Freq: Four times a day (QID) | ORAL | Status: DC | PRN
  Administered 2020-05-01 – 2020-05-03 (×3): 650 mg via ORAL
  Filled 2020-04-30 (×2): qty 2

## 2020-04-30 MED ORDER — INSULIN ASPART 100 UNIT/ML ~~LOC~~ SOLN
0.0000 [IU] | Freq: Three times a day (TID) | SUBCUTANEOUS | Status: DC
  Administered 2020-04-30: 2 [IU] via SUBCUTANEOUS
  Filled 2020-04-30: qty 1

## 2020-04-30 MED ORDER — PREGABALIN 25 MG PO CAPS
50.0000 mg | ORAL_CAPSULE | Freq: Two times a day (BID) | ORAL | Status: DC
  Administered 2020-04-30: 50 mg via ORAL
  Filled 2020-04-30 (×3): qty 2

## 2020-04-30 MED ORDER — AMLODIPINE BESYLATE 5 MG PO TABS
10.0000 mg | ORAL_TABLET | Freq: Every day | ORAL | Status: DC
  Administered 2020-05-01 – 2020-05-05 (×5): 10 mg via ORAL
  Filled 2020-04-30 (×5): qty 2
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
## 2020-04-30 NOTE — ED Notes (Signed)
Breakfast tray provided. Patient ate 100%. Awaiting transfer to lower level BHU.

## 2020-04-30 NOTE — [facility] Group Notes (Signed)
Balance In Life 04/30/2020 1PM  Type of Therapy/Topic:  Group Therapy:  Balance in Life  Participation Level:  Did Not Attend  Description of Group:   This group will address the concept of balance and how it feels and looks when one is unbalanced. Patients will be encouraged to process areas in their lives that are out of balance and identify reasons for remaining unbalanced. Facilitators will guide patients in utilizing problem-solving interventions to address and correct the stressor making their life unbalanced. Understanding and applying boundaries will be explored and addressed for obtaining and maintaining a balanced life. Patients will be encouraged to explore ways to assertively make their unbalanced needs known to significant others in their lives, using other group members and facilitator for support and feedback.  Therapeutic Goals: 1. Patient will identify two or more emotions or situations they have that consume much of in their lives. 2. Patient will identify signs/triggers that life has become out of balance:  3. Patient will identify two ways to set boundaries in order to achieve balance in their lives:  4. Patient will demonstrate ability to communicate their needs through discussion and/or role plays  Summary of Patient Progress:    Therapeutic Modalities:   Cognitive Behavioral Therapy Solution-Focused Therapy Assertiveness Training  Zian Mohamed T Abryanna Musolino, LCSW  

## 2020-04-30 NOTE — Progress Notes (Signed)
BRIEF PHARMACY NOTE   This patient attended and participated in Medication Management Group counseling led by [facility] staff pharmacist.  This interactive class reviews basic information about prescription medications and education on personal responsibility in medication management.  The class also includes general knowledge of 3 main classes of behavioral medications, including antipsychotics, antidepressants, and mood stabilizers.     Patient behavior was appropriate for group setting.   Educational materials sourced from:  "Medication Do's and Don'ts" from [email or web address]   "Mental Health Medications" from [facility] [email or web address]     Albina Billet ,PharmD 04/30/2020 , 3:10 PM
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
## 2020-04-30 NOTE — Progress Notes (Signed)
Recreation Therapy Notes  Date: 04/30/2020  Time: 9:30 am   Location: Craft room    Behavioral response: N/A   Intervention Topic: Relaxation   Discussion/Intervention: Patient did not attend group.   Clinical Observations/Feedback:  Patient did not attend group.   Mattilynn Forrer LRT/CTRS        Shailene Demonbreun 04/30/2020 11:06 AM

## 2020-04-30 NOTE — BH Assessment (Signed)
PATIENT BED AVAILABLE 04/30/20 AFTER 9:30AM  Patient is to be admitted to [facility] by Psychiatric Nurse Practitioner Gillermo Murdoch.  Attending Physician will be Dr. Toni Amend.   Patient has been assigned to room 313, by [facility] Charge Nurse [place].   Intake Paper Work has been signed and placed on patient chart.  ER staff is aware of the admission:  [facility] ER Secretary    Dr. Don Perking, ER MD   [name] Patient's Nurse   [name] Patient Access.

## 2020-04-30 NOTE — ED Notes (Signed)
Pt ambulatory to toilet and back to room

## 2020-04-30 NOTE — ED Notes (Signed)
TTS called and states that pt can go to psych tomorrow morning after meds are administered. Around 8-9 AM

## 2020-04-30 NOTE — ED Notes (Signed)
Report given to receiving nurse. Patient awaiting security for transport to lower level BHU.

## 2020-04-30 NOTE — Progress Notes (Signed)
Recreation Therapy Notes  INPATIENT RECREATION THERAPY ASSESSMENT  Patient Details Name: Marilyn Weber MRN: [id number] DOB: November 09, 1946 Today's Date: 04/30/2020       Information Obtained From: Patient  Able to Participate in Assessment/Interview: Yes  Patient Presentation: Responsive  Reason for Admission (Per Patient): Active Symptoms  Patient Stressors:    Coping Skills:   Talk, Prayer  Leisure Interests (2+):  Music - Listen, Social - Family(Church)  Frequency of Recreation/Participation: Weekly  Awareness of Community Resources:  Yes  Community Resources:  [employer]  Current Use:    If no, Barriers?:    Expressed Interest in [employer] Information:    [place]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

## 2020-04-30 NOTE — ED Notes (Signed)
Assumed care of patient, awake early sitting in bed calm and cooperative. Denied SI/HI/HV. Awaiting transfer to lower level BH this morning Safety maintained will monitor.

## 2020-05-01 ENCOUNTER — Inpatient Hospital Stay: Payer: Medicare Other

## 2020-05-01 LAB — T3, FREE: T3, Free: 12.6 pg/mL — ABNORMAL HIGH (ref 2.0–4.4)

## 2020-05-01 LAB — THYROID PEROXIDASE ANTIBODY: Thyroperoxidase Ab SerPl-aCnc: <9 IU/mL (ref 0–34)

## 2020-05-01 LAB — T3 UPTAKE: T3 Uptake Ratio: 35 % (ref 24–39)

## 2020-05-01 LAB — GLUCOSE, CAPILLARY: Glucose-Capillary: 89 mg/dL (ref 70–99)

## 2020-05-01 IMAGING — US US THYROID
1 series · 14 of 25 positions shown · non-contrast
Comparison: None.

CLINICAL DATA: Hyperthyroid.

EXAM:
THYROID ULTRASOUND
TECHNIQUE: Ultrasound examination of the thyroid gland and adjacent soft
tissues was performed.

[Series 1: us thyroid · 14 of 32 slices shown]
[im 1/32]
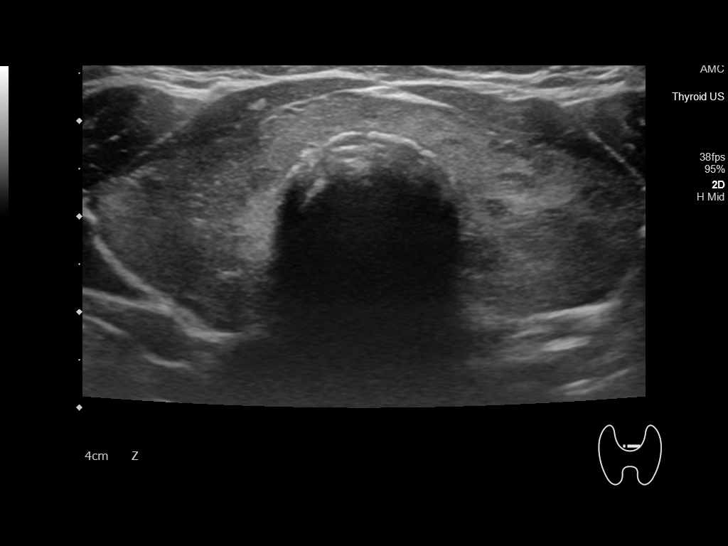
[im 3/32]
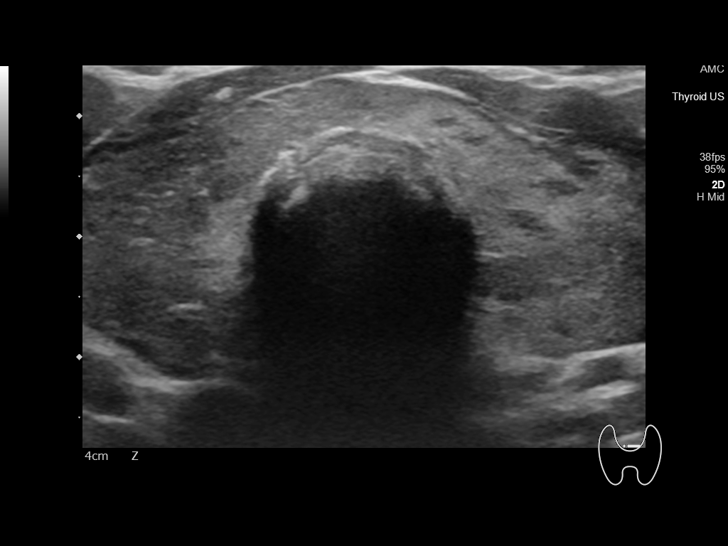
[im 6/32]
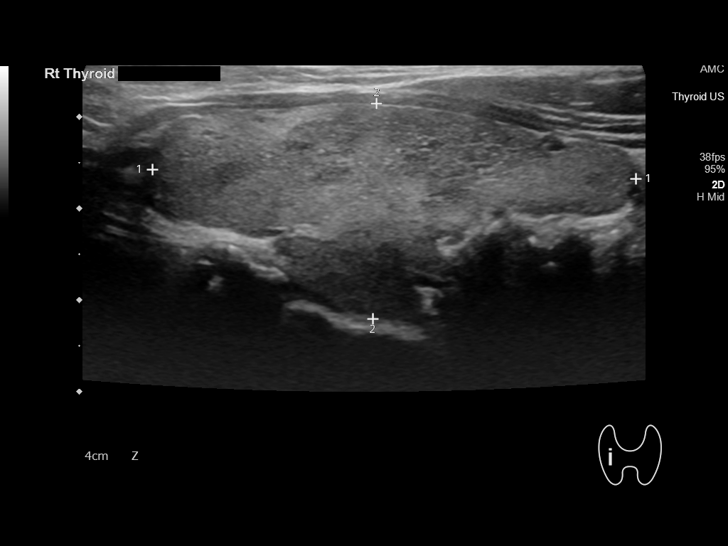
[im 8/32]
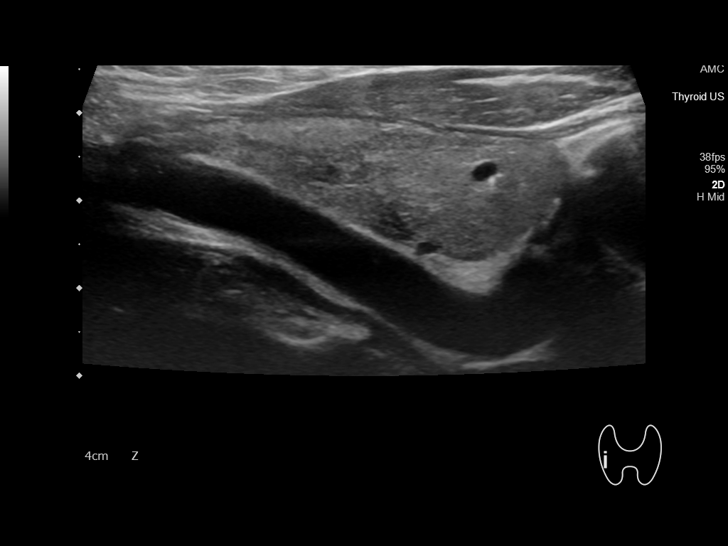
[im 11/32]
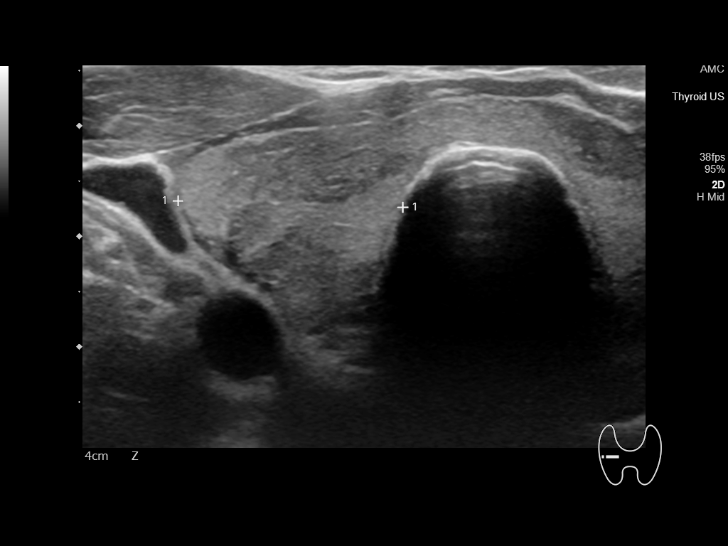
[im 12/32]
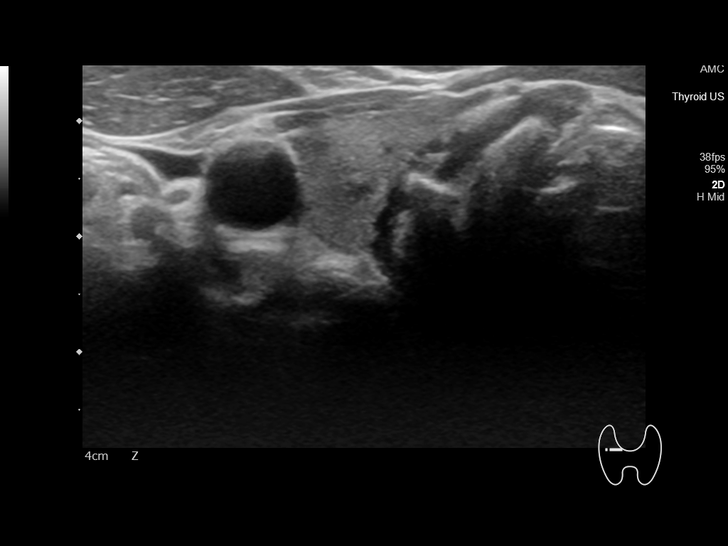
[im 15/32]
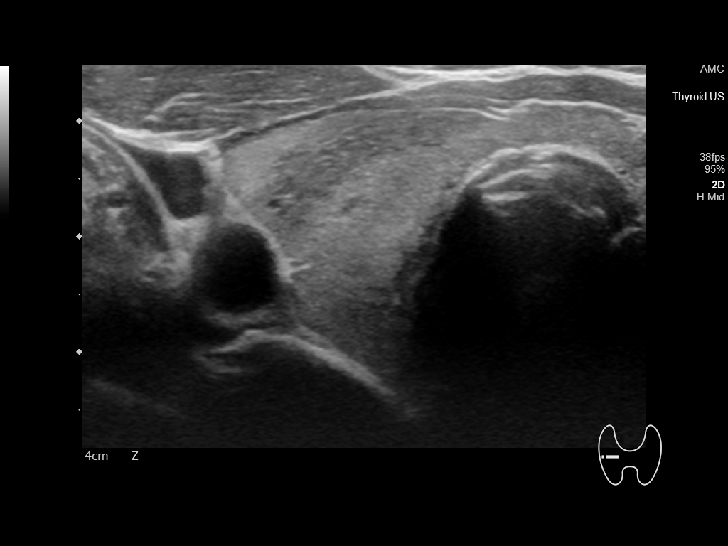
[im 17/32]
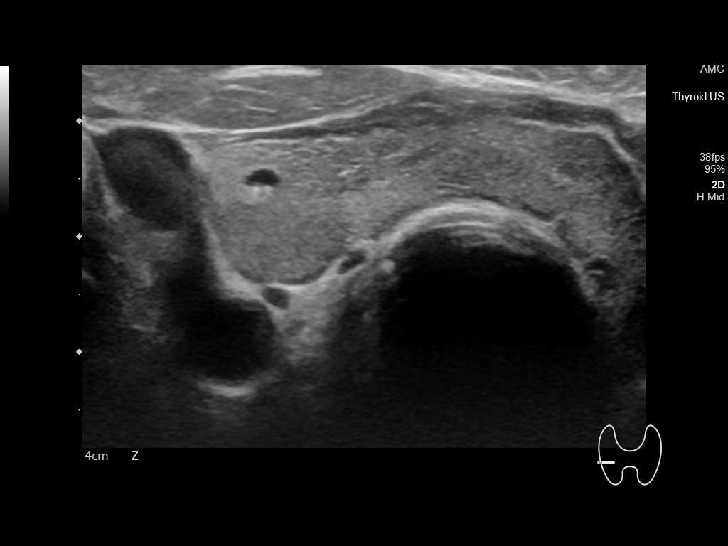
[im 20/32]
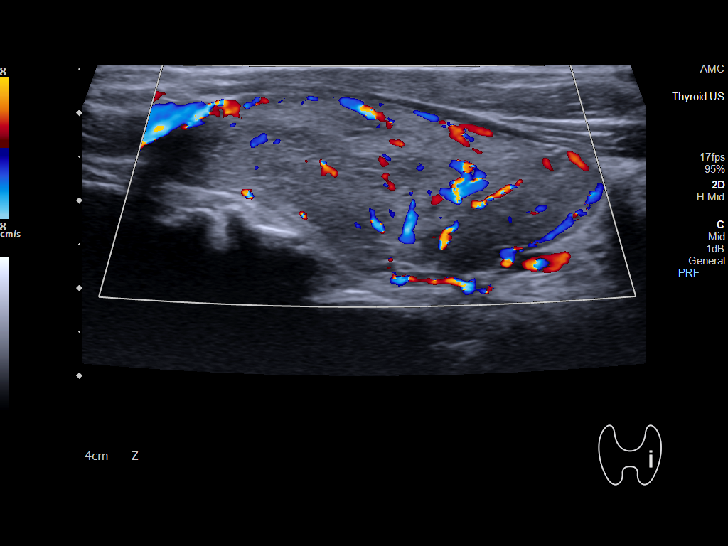
[im 21/32]
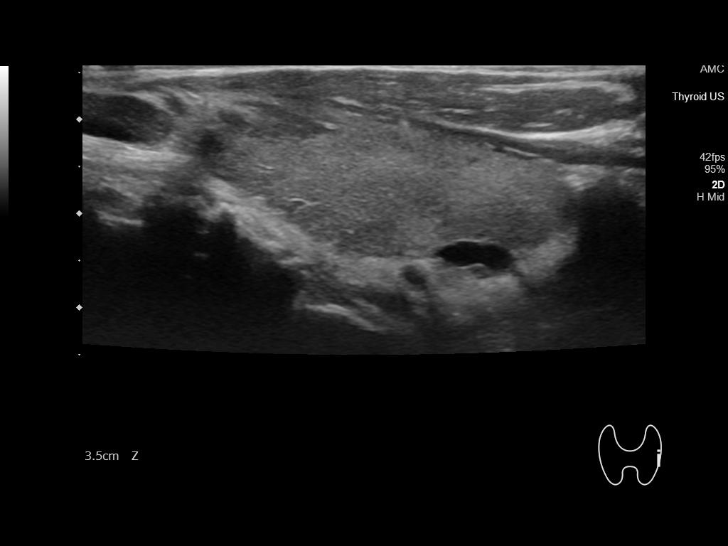
[im 24/32]
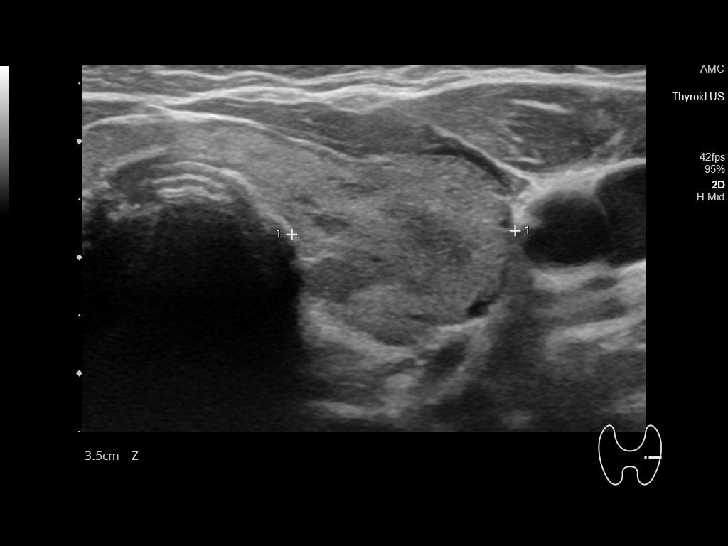
[im 26/32]
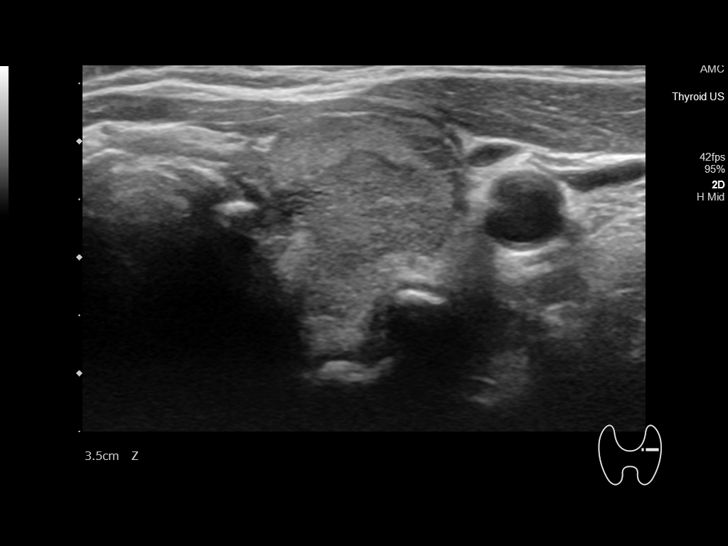
[im 29/32]
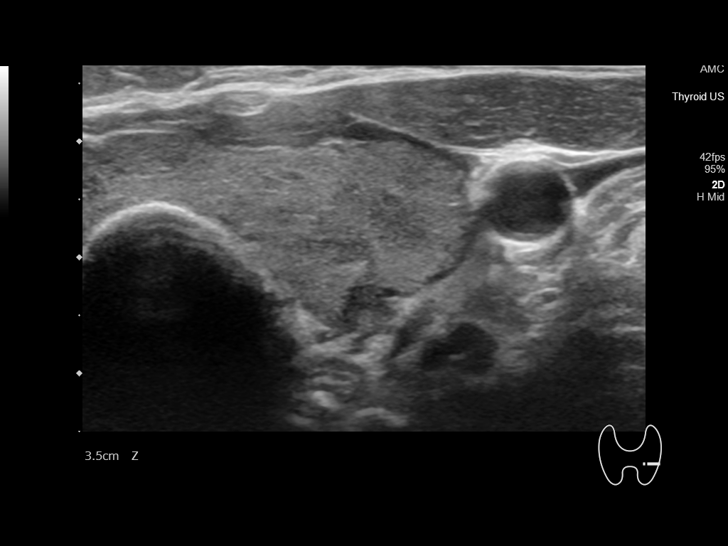
[im 32/32]
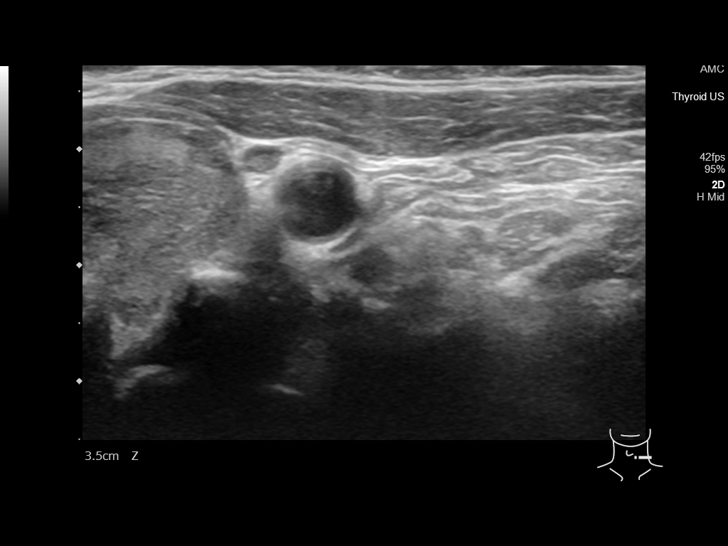

[14 of 25 positions shown; findings below may reference images not displayed]

FINDINGS: Parenchymal Echotexture: Moderately heterogenous

Isthmus: 0.5 cm

Right lobe: 5.3 x 2.4 x 2.0 cm

Left lobe: 5.2 x 2.0 x 1.9 cm

_________________________________________________________

Estimated total number of nodules >/= 1 cm: 0

Number of spongiform nodules >/=  2 cm not described below (TR1): 0

Number of mixed cystic and solid nodules >/= 1.5 cm not described
below (TR2): 0

_________________________________________________________

No discrete nodules are seen within the thyroid gland. No abnormal
lymph nodes identified.
IMPRESSION: Heterogeneous thyroid parenchyma without discrete nodules.

The above is in keeping with the ACR TI-RADS recommendations - [HOSPITAL] [LT];[DATE].

## 2020-05-01 MED ORDER — INSULIN GLARGINE 100 UNIT/ML ~~LOC~~ SOLN
15.0000 [IU] | Freq: Every day | SUBCUTANEOUS | Status: DC
  Administered 2020-05-01 – 2020-05-04 (×4): 15 [IU] via SUBCUTANEOUS
  Filled 2020-05-01 (×5): qty 0.15

## 2020-05-01 MED ORDER — GLIPIZIDE 5 MG PO TABS
5.0000 mg | ORAL_TABLET | Freq: Two times a day (BID) | ORAL | Status: DC
  Administered 2020-05-01 – 2020-05-05 (×8): 5 mg via ORAL
  Filled 2020-05-01 (×9): qty 1

## 2020-05-01 MED ORDER — HALOPERIDOL 5 MG PO TABS
7.5000 mg | ORAL_TABLET | Freq: Every day | ORAL | Status: DC
  Administered 2020-05-01: 7.5 mg via ORAL
  Filled 2020-05-01: qty 2

## 2020-05-01 NOTE — Progress Notes (Signed)
F - Decrease symptoms of psychosis and manage delusions.  D - The patient awakened for breakfast, napped, attended groups, socialized, and watched television. She was cooperative and pleasant. Nyhla endorsed an improved mood.  On the PSI-A, the patient stated that her anxiety and depression were both "better."  No unsafe behaviors noted. Marilyn Weber denied having thoughts of suicide.  Overall, the patient was calm and cooperative, but struggles with paranoid delusions.   A Marilyn Weber accepted medications as ordered. She endorsed knee soreness and was provided PRN medication. The patient was provided emotional support and guidance during the afternoon. Marilyn Weber was encouraged to advocate for her needs and to attend groups. Recent medication adjustments ordered.   R - Continue with care and monitor BG's.   Monitor ambulation.

## 2020-05-01 NOTE — Progress Notes (Signed)
Recreation Therapy Notes   Date: 05/01/2020  Time: 10:30 am  Location: Craft room   Behavioral response: Appropriate   Intervention Topic: Goals  Discussion/Intervention:  Group content on today was focused on goals. Patients described what goals are and how they define goals. Individuals expressed how they go about setting goals and reaching them. The group identified how important goals are and if they make short term goals to reach long term goals. Patients described how many goals they work on at a time and what affects them not reaching their goal. Individuals described how much time they put into planning and obtaining their goals. The group participated in the intervention "My Goal Board" and made personal goal boards to help them achieve their goal. Clinical Observations/Feedback:  Patient came to group and defined goals as reaching for dreams and success. Individual was social with peers and staff while participating in the intervention.   Shaverence Outlaw LRT/CTRS            Shaverence  Outlaw 05/01/2020 12:23 PM

## 2020-05-01 NOTE — [facility] Suicide Risk Assessment (Signed)
[facility] INPATIENT:  Family/Significant Other Suicide Prevention Education  Suicide Prevention Education:  Contact Attempts: Lauretta Grill, sister, [phone] has been identified by the patient as the family member/significant other with whom the patient will be residing, and identified as the person(s) who will aid the patient in the event of a mental health crisis.  With written consent from the patient, two attempts were made to provide suicide prevention education, prior to and/or following the patient's discharge.  We were unsuccessful in providing suicide prevention education.  A suicide education pamphlet was given to the patient to share with family/significant other.  Date and time of first attempt:05/01/2020 at 11:35AM Date and time of second attempt: Second attempt is needed.  CSW notes that she was unable to leave a HIPAA compliant voicemail.  Harden Mo 05/01/2020, 11:35 AM
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
## 2020-05-01 NOTE — Plan of Care (Signed)
The patient is cooperative and pleasant.  Problem: Education: Goal: Mental status will improve Outcome: Progressing Goal: Verbalization of understanding the information provided will improve Outcome: Progressing

## 2020-05-01 NOTE — B[facility] Group Notes (Signed)
[facility] Group Notes:  (Nursing/MHT/Case Management/Adjunct)  Date:  05/01/2020  Time:  8:40 PM  Type of Therapy:  Wrap-Up  Participation Level:  Active  Participation Quality:  Appropriate  Affect:  Appropriate  Cognitive:  Alert and Appropriate  Insight:  Appropriate and Good  Engagement in Group:  Engaged  Modes of Intervention:  Clarification  Summary of Progress/Problems:  Marilyn Weber 05/01/2020, 8:40 PM

## 2020-05-01 NOTE — Progress Notes (Signed)
[facility] MD Progress Note  05/01/2020 1:43 PM Marilyn Weber  MRN:  [id number] Subjective: Patient is a 74 year old female admitted on 04/30/2020 secondary to paranoia, auditory hallucinations and a past psychiatric history significant for schizophrenia.  Objective: Patient is seen and examined.  Patient is a 74 year old female with the above-stated past psychiatric history who is seen in follow-up.  She stated this morning that she still hears the voices, and that I do not believe her that they are real.  I explained to her that the medicines were to help her cope better with them.  Her paranoia has not changed at all.  She remains pleasant about this.  Her vital signs are stable, and she is mildly tachycardic with a rate of 109.  She slept 7.5 hours last night.  On physical examination she does not have any cogwheeling today.  Review of her laboratories again revealed the elevated BNP from yesterday, her low normal folate at 10.9, and her low B12.  Both her folic acid and thiamine are being repleted.  As stated yesterday her TSH was less than 0.010.  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  Her thyroperoxidase antibody was negative.  Her T3 uptake was 35.  Her thyroid ultrasound revealed a heterogeneous thyroid parenchyma without discrete nodules.  Her EKG showed sinus rhythm with a sinus arrhythmia and a QTC that was within normal limits.  Her blood sugar this morning was 89.  She is on glipizide XL 10 mg p.o. daily from home.  She is also on Lantus insulin, but was on a higher dose than what I wrote.  Because of the low blood sugar I am going to reduce her Lantus down to 10 units today.  Principal Problem: <principal problem not specified> Diagnosis: Active Problems:   Paranoia (HCC)  Total Time spent with patient: 20 minutes  Past Psychiatric History: See admission H&P  Past Medical History:  Past Medical History:  Diagnosis Date  . Asthma   . Diabetes mellitus without complication  (HCC)   . Hypertension     Past Surgical History:  Procedure Laterality Date  . ABDOMINAL HYSTERECTOMY    . BREAST CYST ASPIRATION Bilateral    Family History:  Family History  Problem Relation Age of Onset  . Lung cancer Brother   . Breast cancer Neg Hx    Family Psychiatric  History: See admission H&P Social History:  Social History   Substance and Sexual Activity  Alcohol Use Never     Social History   Substance and Sexual Activity  Drug Use Not on file    Social History   Socioeconomic History  . Marital status: Widowed    Spouse name: Not on file  . Number of children: Not on file  . Years of education: Not on file  . Highest education level: Not on file  Occupational History  . Not on file  Tobacco Use  . Smoking status: Never Smoker  . Smokeless tobacco: Never Used  Substance and Sexual Activity  . Alcohol use: Never  . Drug use: Not on file  . Sexual activity: Not on file  Other Topics Concern  . Not on file  Social History Narrative  . Not on file   Social Determinants of Health   Financial Resource Strain:   . Difficulty of Paying Living Expenses:   Food Insecurity:   . Worried About Programme researcher, broadcasting/film/video in the Last Year:   . [employer] of  Food in the Last Year:   Transportation Needs:   . Film/video editor (Medical):   Marland Kitchen Lack of Transportation (Non-Medical):   Physical Activity:   . Days of Exercise per Week:   . Minutes of Exercise per Session:   Stress:   . Feeling of Stress :   Social Connections:   . Frequency of Communication with Friends and Family:   . Frequency of Social Gatherings with Friends and Family:   . Attends Religious Services:   . Active Member of Clubs or Organizations:   . Attends Archivist Meetings:   Marland Kitchen Marital Status:    Additional Social History:                         Sleep: Good  Appetite:  Good  Current Medications: Current Facility-Administered Medications  Medication Dose  Route Frequency Provider Last Rate Last Admin  . acetaminophen (TYLENOL) tablet 650 mg  650 mg Oral Q6H PRN Caroline Sauger, NP   650 mg at 05/01/20 1230  . alum & mag hydroxide-simeth (MAALOX/MYLANTA) 200-200-20 MG/5ML suspension 30 mL  30 mL Oral Q4H PRN Caroline Sauger, NP      . amLODipine (NORVASC) tablet 10 mg  10 mg Oral Daily Sharma Covert, MD   10 mg at 05/01/20 0851  . glipiZIDE (GLUCOTROL XL) 24 hr tablet 10 mg  10 mg Oral Q breakfast Sharma Covert, MD   10 mg at 05/01/20 0849  . haloperidol (HALDOL) tablet 2 mg  2 mg Oral Daily Sharma Covert, MD   2 mg at 05/01/20 0850  . haloperidol (HALDOL) tablet 5 mg  5 mg Oral QHS Sharma Covert, MD      . hydrochlorothiazide (HYDRODIURIL) tablet 25 mg  25 mg Oral Daily Sharma Covert, MD   25 mg at 05/01/20 0849  . insulin aspart (novoLOG) injection 0-9 Units  0-9 Units Subcutaneous TID WC Sharma Covert, MD   2 Units at 04/30/20 1852  . insulin glargine (LANTUS) injection 20 Units  20 Units Subcutaneous QHS Sharma Covert, MD      . magnesium hydroxide (MILK OF MAGNESIA) suspension 30 mL  30 mL Oral Daily PRN Caroline Sauger, NP      . pregabalin (LYRICA) capsule 50 mg  50 mg Oral BID Sharma Covert, MD   50 mg at 04/30/20 1716  . rOPINIRole (REQUIP) tablet 1 mg  1 mg Oral QHS Sharma Covert, MD      . traMADol Veatrice Bourbon) tablet 50 mg  50 mg Oral Q8H PRN Sharma Covert, MD        Lab Results:  Results for orders placed or performed during the hospital encounter of 04/30/20 (from the past 48 hour(s))  Hemoglobin A1c     Status: Abnormal   Collection Time: 04/29/20  9:07 PM  Result Value Ref Range   Hgb A1c MFr Bld 6.2 (H) 4.8 - 5.6 %    Comment: (NOTE) Pre diabetes:          5.7%-6.4% Diabetes:              >6.4% Glycemic control for   <7.0% adults with diabetes    Mean Plasma Glucose 131.24 mg/dL    Comment: Performed at [facility], [place] [address], [place], [place]  [address]  Folate     Status: None   Collection Time: 04/30/20  1:06 PM  Result Value Ref  Range   Folate 10.9 >5.9 ng/mL    Comment: Performed at [facility], [address] [place], [place] [address]  Vitamin B12     Status: Abnormal   Collection Time: 04/30/20  1:06 PM  Result Value Ref Range   Vitamin B-12 143 (L) 180 - 914 pg/mL    Comment: (NOTE) This assay is not validated for testing neonatal or myeloproliferative syndrome specimens for Vitamin B12 levels. Performed at [facility], [address] [place], [place] [address]   TSH     Status: Abnormal   Collection Time: 04/30/20  1:06 PM  Result Value Ref Range   TSH <0.010 (L) 0.350 - 4.500 uIU/mL    Comment: Performed by a 3rd Generation assay with a functional sensitivity of <=0.01 uIU/mL. Performed at [facility], [address] [place], [place] [address]   Brain natriuretic peptide     Status: Abnormal   Collection Time: 04/30/20  1:06 PM  Result Value Ref Range   B Natriuretic Peptide 117.0 (H) 0.0 - 100.0 pg/mL    Comment: Performed at [facility], [address] [place], [place] [address]  T3, Free     Status: Abnormal   Collection Time: 04/30/20  1:06 PM  Result Value Ref Range   T3, Free 12.6 (H) 2.0 - 4.4 pg/mL    Comment: (NOTE) Performed At: [facility] [address] [place], [place] [address] Jolene SchimkeNagendra Sanjai MD [phone]   T3 uptake     Status: None   Collection Time: 04/30/20  1:06 PM  Result Value Ref Range   T3 Uptake Ratio 35 24 - 39 %    Comment: (NOTE) Performed At: [facility] [address] [place], [place] [id number] Jolene SchimkeNagendra Sanjai MD [phone]   Thyroid peroxidase antibody     Status: None   Collection Time: 04/30/20  1:06 PM  Result Value Ref Range   Thyroperoxidase Ab SerPl-aCnc <9 0 - 34 IU/mL    Comment: (NOTE) Performed At: [facility] [address] [place], [place] [id number] Jolene SchimkeNagendra Sanjai MD  [phone]   T4, free     Status: Abnormal   Collection Time: 04/30/20  1:06 PM  Result Value Ref Range   Free T4 2.58 (H) 0.61 - 1.12 ng/dL    Comment: (NOTE) Biotin ingestion may interfere with free T4 tests. If the results are inconsistent with the TSH level, previous test results, or the clinical presentation, then consider biotin interference. If needed, order repeat testing after stopping biotin. Performed at [facility], [address] [place], [place] [address]   Glucose, capillary     Status: Abnormal   Collection Time: 04/30/20  6:41 PM  Result Value Ref Range   Glucose-Capillary 157 (H) 70 - 99 mg/dL    Comment: Glucose reference range applies only to samples taken after fasting for at least 8 hours.  Glucose, capillary     Status: Abnormal   Collection Time: 04/30/20  9:38 PM  Result Value Ref Range   Glucose-Capillary 129 (H) 70 - 99 mg/dL    Comment: Glucose reference range applies only to samples taken after fasting for at least 8 hours.  Glucose, capillary     Status: None   Collection Time: 05/01/20  7:03 AM  Result Value Ref Range   Glucose-Capillary 89 70 - 99 mg/dL    Comment: Glucose reference range applies only to samples taken after fasting for at least 8 hours.   Comment 1  Notify RN     Blood Alcohol level:  Lab Results  Component Value Date   ETH <10 04/29/2020    Metabolic Disorder Labs: Lab Results  Component Value Date   HGBA1C 6.2 (H) 04/29/2020   MPG 131.24 04/29/2020   No results found for: PROLACTIN Lab Results  Component Value Date   CHOL 194 03/10/2014   TRIG 148 03/10/2014   HDL 62 (H) 03/10/2014   VLDL 30 03/10/2014   LDLCALC 102 (H) 03/10/2014    Physical Findings: AIMS:  , ,  ,  ,    CIWA:    COWS:     Musculoskeletal: Strength & Muscle Tone: decreased Gait & Station: shuffle Patient leans: N/A  Psychiatric Specialty Exam: Physical Exam  Nursing note and vitals reviewed. Constitutional: She is  oriented to person, place, and time. She appears well-developed and well-nourished.  HENT:  Head: Normocephalic and atraumatic.  Respiratory: Effort normal.  Neurological: She is alert and oriented to person, place, and time.    Review of Systems  Blood pressure 126/75, pulse (!) 109, temperature 98.7 F (37.1 C), temperature source Oral, resp. rate 17, height 5\' 4"  (1.626 m), weight 68.9 kg, SpO2 99 %.Body mass index is 26.09 kg/m.  General Appearance: Casual  Eye Contact:  Fair  Speech:  Normal Rate  Volume:  Normal  Mood:  Anxious  Affect:  Congruent  Thought Process:  Goal Directed and Descriptions of Associations: Circumstantial  Orientation:  Full (Time, Place, and Person)  Thought Content:  Delusions, Hallucinations: Auditory and Paranoid Ideation  Suicidal Thoughts:  No  Homicidal Thoughts:  No  Memory:  Immediate;   Good Recent;   Good Remote;   Good  Judgement:  Intact  Insight:  Fair  Psychomotor Activity:  Normal  Concentration:  Concentration: Fair and Attention Span: Fair  Recall:  of Knowledge:  Fair  Language:  Good  Akathisia:  Negative  Handed:  Right  AIMS (if indicated):     Assets:  Desire for Improvement Housing Resilience  ADL's:  Intact  Cognition:  WNL  Sleep:  Number of Hours: 7.5     Treatment Plan Summary: Daily contact with patient to assess and evaluate symptoms and progress in treatment, Medication management and Plan : Patient is seen and examined.  Patient is a 74 year old female with the above-stated past psychiatric history who is seen in follow-up.   Diagnosis: #1 schizophrenia, #2 type 2 diabetes, #3 hypertension, #4 hyperthyroidism, #5 restless leg syndrome, #6 peripheral neuropathy, #7 chronic pain  Patient is seen in follow-up.  She is essentially unchanged with regard to her psychiatric situation.  I am going to keep her Haldol at 2 mg p.o. daily, but increase her bedtime Haldol to 7.5 mg p.o. nightly.  With regard to  her medical issues I am going to decrease her Lantus to 15 units subcu nightly.  I am also going to stop the glipizide XL.  I think the XL would probably lead to more problems with hypoglycemia given her current state.  I will place her on the short acting glipizide 5 mg p.o. twice daily to start.  I will hold off on any methimazole at this point for her hyperthyroidism.  Primary psychiatry may want to get a medicine consultation and consider a nuclear scan of her thyroid.  1.  Continue amlodipine 10 mg p.o. daily for hypertension 2.  Stop glipizide XL. 3.  Start glipizide short acting 5 mg p.o. twice daily for  diabetes mellitus. 4.  Increase haloperidol to 2 mg p.o. daily and 7.5 mg p.o. nightly for psychosis. 5.  Continue hydrochlorothiazide 25 mg p.o. daily for hypertension and probable some degree of heart failure. 6.  Continue sliding scale insulin for diabetes mellitus. 7.  Decrease Lantus insulin to 15 units subcu nightly for diabetes mellitus. 8.  Continue Lyrica 50 mg p.o. twice daily for peripheral neuropathy. 9.  Continue Requip 1 mg p.o. nightly for restless leg syndrome. 10.  Continue tramadol 50 mg p.o. every 8 hours as needed pain. 11.  Order electrolyte panel and BNP for tomorrow morning. 12.  Consideration of medicine consult and possible nuclear scan of thyroid secondary to new diagnosis of hyperthyroidism in the presence of her psychiatric condition. 13.  Disposition planning-in progress.  Antonieta Pert, MD 05/01/2020, 1:43 PM

## 2020-05-01 NOTE — Tx Team (Addendum)
Interdisciplinary Treatment and Diagnostic Plan Update  05/01/2020 Time of Session: 10:00AM Marilyn Weber MRN: [id number]  Principal Diagnosis: <principal problem not specified>  Secondary Diagnoses: Active Problems:   Paranoia ([place])   Current Medications:  Current Facility-Administered Medications  Medication Dose Route Frequency Provider Last Rate Last Admin  . acetaminophen (TYLENOL) tablet 650 mg  650 mg Oral Q6H PRN Caroline Sauger, NP      . alum & mag hydroxide-simeth (MAALOX/MYLANTA) 200-200-20 MG/5ML suspension 30 mL  30 mL Oral Q4H PRN Caroline Sauger, NP      . amLODipine (NORVASC) tablet 10 mg  10 mg Oral Daily Sharma Covert, MD   10 mg at 05/01/20 0851  . glipiZIDE (GLUCOTROL XL) 24 hr tablet 10 mg  10 mg Oral Q breakfast Sharma Covert, MD   10 mg at 05/01/20 0849  . haloperidol (HALDOL) tablet 2 mg  2 mg Oral Daily Sharma Covert, MD   2 mg at 05/01/20 0850  . haloperidol (HALDOL) tablet 5 mg  5 mg Oral QHS Sharma Covert, MD      . hydrochlorothiazide (HYDRODIURIL) tablet 25 mg  25 mg Oral Daily Sharma Covert, MD   25 mg at 05/01/20 0849  . insulin aspart (novoLOG) injection 0-9 Units  0-9 Units Subcutaneous TID WC Sharma Covert, MD   2 Units at 04/30/20 1852  . insulin glargine (LANTUS) injection 20 Units  20 Units Subcutaneous QHS Sharma Covert, MD      . magnesium hydroxide (MILK OF MAGNESIA) suspension 30 mL  30 mL Oral Daily PRN Caroline Sauger, NP      . pregabalin (LYRICA) capsule 50 mg  50 mg Oral BID Sharma Covert, MD   50 mg at 04/30/20 1716  . rOPINIRole (REQUIP) tablet 1 mg  1 mg Oral QHS Sharma Covert, MD      . traMADol Veatrice Bourbon) tablet 50 mg  50 mg Oral Q8H PRN Sharma Covert, MD       PTA Medications: Medications Prior to Admission  Medication Sig Dispense Refill Last Dose  . amLODipine (NORVASC) 10 MG tablet Take 10 mg by mouth daily.     Water engineer Bandages & Supports (MEDICAL COMPRESSION  STOCKINGS) MISC Please provide 73mHg compression stockings 1 each 0   . empagliflozin (JARDIANCE) 25 MG TABS tablet Take 25 mg by mouth daily.      .Marland KitchenglipiZIDE (GLUCOTROL XL) 10 MG 24 hr tablet Take 10 mg by mouth daily with breakfast.     . hydrochlorothiazide (HYDRODIURIL) 25 MG tablet Take 25 mg by mouth daily.     . meloxicam (MOBIC) 15 MG tablet Take 15 mg by mouth daily.     .Marland Kitchenomeprazole (PRILOSEC) 20 MG capsule Take 20 mg by mouth daily.     . ondansetron (ZOFRAN) 4 MG tablet Take 4 mg by mouth every 8 (eight) hours as needed.     .Marland KitchenrOPINIRole (REQUIP) 1 MG tablet Take 1 mg by mouth at bedtime.     . traZODone (DESYREL) 50 MG tablet Take 50 mg by mouth at bedtime as needed for sleep.       Patient Stressors:    Patient Strengths:    Treatment Modalities: Medication Management, Group therapy, Case management,  1 to 1 session with clinician, Psychoeducation, Recreational therapy.   Physician Treatment Plan for Primary Diagnosis: <principal problem not specified> Long Term Goal(s): Improvement in symptoms so as ready for discharge Improvement in symptoms so  as ready for discharge   Short Term Goals: Ability to identify changes in lifestyle to reduce recurrence of condition will improve Ability to verbalize feelings will improve Ability to demonstrate self-control will improve Ability to identify and develop effective coping behaviors will improve Ability to maintain clinical measurements within normal limits will improve Compliance with prescribed medications will improve Ability to identify changes in lifestyle to reduce recurrence of condition will improve Ability to verbalize feelings will improve Ability to demonstrate self-control will improve Ability to identify and develop effective coping behaviors will improve Ability to maintain clinical measurements within normal limits will improve Compliance with prescribed medications will improve  Medication Management:  Evaluate patient's response, side effects, and tolerance of medication regimen.  Therapeutic Interventions: 1 to 1 sessions, Unit Group sessions and Medication administration.  Evaluation of Outcomes: Not Met  Physician Treatment Plan for Secondary Diagnosis: Active Problems:   Paranoia ([place])  Long Term Goal(s): Improvement in symptoms so as ready for discharge Improvement in symptoms so as ready for discharge   Short Term Goals: Ability to identify changes in lifestyle to reduce recurrence of condition will improve Ability to verbalize feelings will improve Ability to demonstrate self-control will improve Ability to identify and develop effective coping behaviors will improve Ability to maintain clinical measurements within normal limits will improve Compliance with prescribed medications will improve Ability to identify changes in lifestyle to reduce recurrence of condition will improve Ability to verbalize feelings will improve Ability to demonstrate self-control will improve Ability to identify and develop effective coping behaviors will improve Ability to maintain clinical measurements within normal limits will improve Compliance with prescribed medications will improve     Medication Management: Evaluate patient's response, side effects, and tolerance of medication regimen.  Therapeutic Interventions: 1 to 1 sessions, Unit Group sessions and Medication administration.  Evaluation of Outcomes: Not Met   RN Treatment Plan for Primary Diagnosis: <principal problem not specified> Long Term Goal(s): Knowledge of disease and therapeutic regimen to maintain health will improve  Short Term Goals: Ability to demonstrate self-control, Ability to participate in decision making will improve, Ability to verbalize feelings will improve, Ability to identify and develop effective coping behaviors will improve and Compliance with prescribed medications will improve  Medication Management:  RN will administer medications as ordered by provider, will assess and evaluate patient's response and provide education to patient for prescribed medication. RN will report any adverse and/or side effects to prescribing provider.  Therapeutic Interventions: 1 on 1 counseling sessions, Psychoeducation, Medication administration, Evaluate responses to treatment, Monitor vital signs and CBGs as ordered, Perform/monitor CIWA, COWS, AIMS and Fall Risk screenings as ordered, Perform wound care treatments as ordered.  Evaluation of Outcomes: Not Met   LCSW Treatment Plan for Primary Diagnosis: <principal problem not specified> Long Term Goal(s): Safe transition to appropriate next level of care at discharge, Engage patient in therapeutic group addressing interpersonal concerns.  Short Term Goals: Engage patient in aftercare planning with referrals and resources, Increase social support, Increase ability to appropriately verbalize feelings, Increase emotional regulation, Facilitate acceptance of mental health diagnosis and concerns and Increase skills for wellness and recovery  Therapeutic Interventions: Assess for all discharge needs, 1 to 1 time with Social worker, Explore available resources and support systems, Assess for adequacy in community support network, Educate family and significant other(s) on suicide prevention, Complete Psychosocial Assessment, Interpersonal group therapy.  Evaluation of Outcomes: Not Met   Progress in Treatment: Attending groups: Yes. Participating in groups: Yes. Taking medication  as prescribed: Yes. Toleration medication: Yes. Family/Significant other contact made: Yes, individual(s) contacted:  once permission is given.  Patient understands diagnosis: No. Discussing patient identified problems/goals with staff: Yes. Medical problems stabilized or resolved: Yes. Denies suicidal/homicidal ideation: Yes. Issues/concerns per patient self-inventory: No. Other:  none  New problem(s) identified: No, Describe:  none  New Short Term/Long Term Goal(s):  elimination of symptoms of psychosis, medication management for mood stabilization; elimination of SI thoughts; development of comprehensive mental wellness/sobriety plan.  Patient Goals:  "get some justice did"  Discharge Plan or Barriers: Patient reports plans to get started with [facility] for outpatient treatment and to return home.   Reason for Continuation of Hospitalization: Delusions  Depression Medication stabilization  Estimated Length of Stay:  1-7 days  Recreational Therapy: Patient Stressors: N/A Patient Goal: Patient will engage in groups without prompting or encouragement from LRT x3 group sessions within 5 recreation therapy group sessions  Attendees: Patient: Marilyn Weber 05/01/2020 11:20 AM  Physician: Dr. Mallie Darting, MD 05/01/2020 11:20 AM  Nursing: Lyda Kalata, RN 05/01/2020 11:20 AM  RN Care Manager: 05/01/2020 11:20 AM  Social Worker: Assunta Curtis, LCSW 05/01/2020 11:20 AM  Recreational Therapist: Roanna Epley, CTRS, LRT 05/01/2020 11:20 AM  Other: Sanjuana Kava, LCSW 05/01/2020 11:20 AM  Other:  05/01/2020 11:20 AM  Other: 05/01/2020 11:20 AM    Scribe for Treatment Team: Rozann Lesches, LCSW 05/01/2020 11:20 AM

## 2020-05-01 NOTE — [facility] Group Notes (Signed)
LCSW Group Therapy Note  05/01/2020 2:10 PM  Type of Therapy and Topic:  Group Therapy:  Feelings around Relapse and Recovery  Participation Level:  Did Not Attend   Description of Group:    Patients in this group will discuss emotions they experience before and after a relapse. They will process how experiencing these feelings, or avoidance of experiencing them, relates to having a relapse. Facilitator will guide patients to explore emotions they have related to recovery. Patients will be encouraged to process which emotions are more powerful. They will be guided to discuss the emotional reaction significant others in their lives may have to their relapse or recovery. Patients will be assisted in exploring ways to respond to the emotions of others without this contributing to a relapse.  Therapeutic Goals: 1. Patient will identify two or more emotions that lead to a relapse for them 2. Patient will identify two emotions that result when they relapse 3. Patient will identify two emotions related to recovery 4. Patient will demonstrate ability to communicate their needs through discussion and/or role plays   Summary of Patient Progress: X  Therapeutic Modalities:   Cognitive Behavioral Therapy Solution-Focused Therapy Assertiveness Training Relapse Prevention Therapy   Penni Homans, MSW, LCSW 05/01/2020 2:10 PM

## 2020-05-02 DIAGNOSIS — G894 Chronic pain syndrome: Secondary | ICD-10-CM | POA: Diagnosis present

## 2020-05-02 DIAGNOSIS — E1110 Type 2 diabetes mellitus with ketoacidosis without coma: Secondary | ICD-10-CM | POA: Diagnosis present

## 2020-05-02 DIAGNOSIS — G609 Hereditary and idiopathic neuropathy, unspecified: Secondary | ICD-10-CM | POA: Diagnosis present

## 2020-05-02 DIAGNOSIS — E0590 Thyrotoxicosis, unspecified without thyrotoxic crisis or storm: Secondary | ICD-10-CM

## 2020-05-02 DIAGNOSIS — F209 Schizophrenia, unspecified: Secondary | ICD-10-CM | POA: Diagnosis present

## 2020-05-02 DIAGNOSIS — R440 Auditory hallucinations: Secondary | ICD-10-CM

## 2020-05-02 DIAGNOSIS — G2581 Restless legs syndrome: Secondary | ICD-10-CM

## 2020-05-02 DIAGNOSIS — I10 Essential (primary) hypertension: Secondary | ICD-10-CM | POA: Diagnosis present

## 2020-05-02 DIAGNOSIS — F22 Delusional disorders: Secondary | ICD-10-CM | POA: Diagnosis present

## 2020-05-02 LAB — BASIC METABOLIC PANEL
Anion gap: 7 (ref 5–15)
BUN: 11 mg/dL (ref 8–23)
CO2: 30 mmol/L (ref 22–32)
Calcium: 9.9 mg/dL (ref 8.9–10.3)
Chloride: 101 mmol/L (ref 98–111)
Creatinine, Ser: 0.4 mg/dL — ABNORMAL LOW (ref 0.44–1.00)
GFR calc Af Amer: >60 mL/min (ref >60)
GFR calc non Af Amer: >60 mL/min (ref >60)
Glucose, Bld: 90 mg/dL (ref 70–99)
Potassium: 3.5 mmol/L (ref 3.5–5.1)
Sodium: 138 mmol/L (ref 135–145)

## 2020-05-02 LAB — GLUCOSE, CAPILLARY
Glucose-Capillary: 110 mg/dL — ABNORMAL HIGH (ref 70–99)
Glucose-Capillary: 77 mg/dL (ref 70–99)
Glucose-Capillary: 141 mg/dL — ABNORMAL HIGH (ref 70–99)
Glucose-Capillary: 95 mg/dL (ref 70–99)

## 2020-05-02 LAB — BRAIN NATRIURETIC PEPTIDE: B Natriuretic Peptide: 23 pg/mL (ref 0.0–100.0)

## 2020-05-02 MED ORDER — HALOPERIDOL 5 MG PO TABS
5.0000 mg | ORAL_TABLET | Freq: Every day | ORAL | Status: DC
  Administered 2020-05-03 – 2020-05-04 (×2): 5 mg via ORAL
  Filled 2020-05-02 (×2): qty 1

## 2020-05-02 MED ORDER — HALOPERIDOL 5 MG PO TABS
5.0000 mg | ORAL_TABLET | Freq: Once | ORAL | Status: AC
  Administered 2020-05-02: 5 mg via ORAL
  Filled 2020-05-02: qty 1

## 2020-05-02 MED ORDER — BENZTROPINE MESYLATE 1 MG/ML IJ SOLN
2.0000 mg | Freq: Two times a day (BID) | INTRAMUSCULAR | Status: DC | PRN
  Filled 2020-05-02: qty 2

## 2020-05-02 MED ORDER — HALOPERIDOL 5 MG PO TABS
10.0000 mg | ORAL_TABLET | Freq: Every day | ORAL | Status: DC
  Administered 2020-05-02 – 2020-05-03 (×2): 10 mg via ORAL
  Filled 2020-05-02 (×2): qty 2

## 2020-05-02 MED ORDER — BENZTROPINE MESYLATE 1 MG PO TABS: 2.0000 mg | ORAL_TABLET | Freq: Two times a day (BID) | ORAL | Status: DC | PRN

## 2020-05-02 NOTE — Progress Notes (Signed)
Met with patient in the med room. Patient pleasant and cooperative. Patient received prescribed meds and tolerated without incident. Patient active on the unit and has found comradery with another resident similar in age on the unit.  Patient denied SI/HI/AVH anxiety and depression at this encounter. Patient is safe on the unit at this time with 15 minute safety check. She was informed to contact staff with any concerns or questions.

## 2020-05-02 NOTE — Progress Notes (Signed)
[facility]  05/02/2020 2:51 PM Donne AnonMary J Kohn  MRN:  [id number] Subjective: Patient is a 74 year old female admitted on 04/30/2020 secondary to paranoia, auditory hallucinations and a past psychiatric history significant for schizophrenia.  Objective: Patient is seen and examined.  Patient is a 74 year old female with the above-stated past psychiatric history who is seen in follow-up.  She stated this morning that she still hears the voices, and feeling that they are sticking her ears and hitting her with breast knuckles.  She does not understand why they are still talking to her.  Reviewed medications again with purpose to decrease auditory hallucinations and tactile hallucinations/delusions.  Patient appears more willing today to take medications, stating "as long as they will not cause her to get diabetes."  On physical examination she does not have any cogwheeling today.    Medication changes from 05/01/2020: Review of her laboratories again revealed the elevated BNP from yesterday, her low normal folate at 10.9, and her low B12.  Both her folic acid and thiamine are being repleted.  As stated yesterday her TSH was less than 0.010.  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  Her thyroperoxidase antibody was negative.  Her T3 uptake was 35.  Her thyroid ultrasound revealed a heterogeneous thyroid parenchyma without discrete nodules.  Her EKG showed sinus rhythm with a sinus arrhythmia and a QTC that was within normal limits.  Her blood sugar this morning was 89.  She is on glipizide XL 10 mg p.o. daily from home.  She is also on Lantus insulin, but was on a higher dose than what I wrote.  Because of the low blood sugar I am going to reduce her Lantus down to 10 units today.  She is awaiting consideration for nuclear imaging to rule out Graves' disease-consider internal medicine consult..  Principal Problem: Schizophrenia ([facility]) Diagnosis: Principal Problem:   Schizophrenia  (H[facility]) Active Problems:   Paranoia ([facility])   Auditory hallucinations   Delusion ([facility])  Total Time spent with patient: 35 minutes  Past Psychiatric History: See admission H&P  Past Medical History:  Past Medical History:  Diagnosis Date  . Asthma   . Diabetes mellitus without complication ([facility])   . Hypertension     Past Surgical History:  Procedure Laterality Date  . ABDOMINAL HYSTERECTOMY    . BREAST CYST ASPIRATION Bilateral    Family History:  Family History  Problem Relation Age of Onset  . Lung cancer Brother   . Breast cancer Neg Hx    Family Psychiatric  History: See admission H&P Social History:  Social History   Substance and Sexual Activity  Alcohol Use Never     Social History   Substance and Sexual Activity  Drug Use Not on file    Social History   Socioeconomic History  . Marital status: Widowed    Spouse name: Not on file  . Number of children: Not on file  . Years of education: Not on file  . Highest education level: Not on file  Occupational History  . Not on file  Tobacco Use  . Smoking status: Never Smoker  . Smokeless tobacco: Never Used  Substance and Sexual Activity  . Alcohol use: Never  . Drug use: Not on file  . Sexual activity: Not on file  Other Topics Concern  . Not on file  Social History Narrative  . Not on file   Social Determinants of Health   Financial Resource Strain:   .  Difficulty of Paying Living Expenses:   Food Insecurity:   . Worried About Programme researcher, broadcasting/film/video in the Last Year:   . Barista in the Last Year:   Transportation Needs:   . Freight forwarder (Medical):   Marland Kitchen Lack of Transportation (Non-Medical):   Physical Activity:   . Days of Exercise per Week:   . Minutes of Exercise per Session:   Stress:   . Feeling of Stress :   Social Connections:   . Frequency of Communication with Friends and Family:   . Frequency of Social Gatherings with Friends and Family:   . Attends Religious Services:    . Active Member of Clubs or Organizations:   . Attends Banker Meetings:   Marland Kitchen Marital Status:    Additional Social History:         Patient states she is from [facility].  She states that she lives in her own home, but her sister and brother live across the road on a family farm. She states she has a daughter in [place] and a son in [place]                 Sleep: Fair  Appetite:  Fair, patient eats less than half per meal.  She tends to want to share her food with a peer.  Current Medications: Current Facility-Administered Medications  Medication Dose Route Frequency Provider Last Rate Last Admin  . acetaminophen (TYLENOL) tablet 650 mg  650 mg Oral Q6H PRN Gillermo Murdoch, NP   650 mg at 05/01/20 1230  . alum & mag hydroxide-simeth (MAALOX/MYLANTA) 200-200-20 MG/5ML suspension 30 mL  30 mL Oral Q4H PRN Gillermo Murdoch, NP      . amLODipine (NORVASC) tablet 10 mg  10 mg Oral Daily Antonieta Pert, MD   10 mg at 05/02/20 0807  . benztropine (COGENTIN) tablet 2 mg  2 mg Oral BID PRN Mariel Craft, MD       Or  . benztropine mesylate (COGENTIN) injection 2 mg  2 mg Intramuscular BID PRN Mariel Craft, MD      . glipiZIDE (GLUCOTROL) tablet 5 mg  5 mg Oral BID AC Antonieta Pert, MD   5 mg at 05/02/20 4174  . haloperidol (HALDOL) tablet 10 mg  10 mg Oral QHS Mariel Craft, MD      . haloperidol (HALDOL) tablet 5 mg  5 mg Oral Once Mariel Craft, MD      . Melene Muller ON 05/03/2020] haloperidol (HALDOL) tablet 5 mg  5 mg Oral Daily Mariel Craft, MD      . hydrochlorothiazide (HYDRODIURIL) tablet 25 mg  25 mg Oral Daily Antonieta Pert, MD   25 mg at 05/02/20 0815  . insulin aspart (novoLOG) injection 0-9 Units  0-9 Units Subcutaneous TID WC Antonieta Pert, MD   2 Units at 04/30/20 1852  . insulin glargine (LANTUS) injection 15 Units  15 Units Subcutaneous QHS Antonieta Pert, MD   15 Units at 05/01/20 2157  . magnesium  hydroxide (MILK OF MAGNESIA) suspension 30 mL  30 mL Oral Daily PRN Gillermo Murdoch, NP      . pregabalin (LYRICA) capsule 50 mg  50 mg Oral BID Antonieta Pert, MD   50 mg at 04/30/20 1716  . rOPINIRole (REQUIP) tablet 1 mg  1 mg Oral QHS Antonieta Pert, MD   1 mg at 05/01/20 2202  . traMADol (ULTRAM) tablet  50 mg  50 mg Oral Q8H PRN Antonieta Pert, MD   50 mg at 05/01/20 2157    Lab Results:  Results for orders placed or performed during the hospital encounter of 04/30/20 (from the past 48 hour(s))  Glucose, capillary     Status: Abnormal   Collection Time: 04/30/20  6:41 PM  Result Value Ref Range   Glucose-Capillary 157 (H) 70 - 99 mg/dL    Comment: Glucose reference range applies only to samples taken after fasting for at least 8 hours.  Glucose, capillary     Status: Abnormal   Collection Time: 04/30/20  9:38 PM  Result Value Ref Range   Glucose-Capillary 129 (H) 70 - 99 mg/dL    Comment: Glucose reference range applies only to samples taken after fasting for at least 8 hours.  Glucose, capillary     Status: None   Collection Time: 05/01/20  7:03 AM  Result Value Ref Range   Glucose-Capillary 89 70 - 99 mg/dL    Comment: Glucose reference range applies only to samples taken after fasting for at least 8 hours.   Comment 1 Notify RN   Basic metabolic panel     Status: Abnormal   Collection Time: 05/02/20  7:37 AM  Result Value Ref Range   Sodium 138 135 - 145 mmol/L   Potassium 3.5 3.5 - 5.1 mmol/L   Chloride 101 98 - 111 mmol/L   CO2 30 22 - 32 mmol/L   Glucose, Bld 90 70 - 99 mg/dL    Comment: Glucose reference range applies only to samples taken after fasting for at least 8 hours.   BUN 11 8 - 23 mg/dL   Creatinine, Ser 6.06 (L) 0.44 - 1.00 mg/dL   Calcium 9.9 8.9 - 30.1 mg/dL   GFR calc non Af Amer >60 >60 mL/min   GFR calc Af Amer >60 >60 mL/min   Anion gap 7 5 - 15    Comment: Performed at [facility], [address] [place], [place]  [address]  Brain natriuretic peptide     Status: None   Collection Time: 05/02/20  7:37 AM  Result Value Ref Range   B Natriuretic Peptide 23.0 0.0 - 100.0 pg/mL    Comment: Performed at [facility], [address] [place], [place] [address]    Blood Alcohol level:  Lab Results  Component Value Date   [facility] <10 04/29/2020    Metabolic Disorder Labs: Lab Results  Component Value Date   HGBA1C 6.2 (H) 04/29/2020   MPG 131.24 04/29/2020   No results found for: PROLACTIN Lab Results  Component Value Date   CHOL 194 03/10/2014   TRIG 148 03/10/2014   HDL 62 (H) 03/10/2014   VLDL 30 03/10/2014   LDLCALC 102 (H) 03/10/2014    Physical Findings: AIMS:  , ,  ,  ,    CIWA:    COWS:     Musculoskeletal: Strength & Muscle Tone: decreased Gait & Station: shuffle Patient leans: N/A  Psychiatric Specialty Exam: Physical Exam  Nursing note and vitals reviewed. Constitutional: She is oriented to person, place, and time. She appears well-developed and well-nourished. No distress.  HENT:  Head: Normocephalic and atraumatic.  Eyes: EOM are normal.  Respiratory: No respiratory distress.  Musculoskeletal:        General: Normal range of motion.     Cervical back: Normal range of motion.  Neurological: She is alert and oriented to person, place, and time.  Skin: No rash noted. No erythema.  Psychiatric: Her speech is normal. Her affect is blunt. She is slowed. Thought content is paranoid and delusional. Cognition and memory are impaired. She expresses inappropriate judgment. She expresses no homicidal and no suicidal ideation.    Review of Systems  Constitutional: Positive for appetite change (decreased).  HENT: Negative.   Respiratory: Negative.   Cardiovascular: Negative.   Gastrointestinal: Negative.   Genitourinary: Negative.   Musculoskeletal: Negative.   Skin: Negative.   Neurological: Negative.   Psychiatric/Behavioral: Positive for confusion, dysphoric mood,  hallucinations and sleep disturbance. Negative for agitation, behavioral problems, self-injury and suicidal ideas.    Blood pressure 117/75, pulse 95, temperature 98.3 F (36.8 C), temperature source Oral, resp. rate 17, height 5\' 4"  (1.626 m), weight 68.9 kg, SpO2 99 %.Body mass index is 26.09 kg/m.  General Appearance: Casual and Disheveled  Eye Contact:  Fair  Speech:  Normal Rate  Volume:  Normal  Mood:  Anxious and Dysphoric  Affect:  Congruent  Thought Process:  Coherent and Descriptions of Associations: Circumstantial  Orientation:  Full (Time, Place, and Person)  Thought Content:  Delusions, Hallucinations: Auditory and Paranoid Ideation  Suicidal Thoughts:  No  Homicidal Thoughts:  No  Memory:  Immediate;   Fair Recent;   Fair Remote;   Fair  Judgement:  Fair  Insight:  Fair  Psychomotor Activity:  Normal  Concentration:  Concentration: Fair and Attention Span: Fair  Recall:  [employer] of Knowledge:  Fair  Language:  Good  Akathisia:  Negative  Handed:  Right  AIMS (if indicated):     Assets:  Desire for Improvement Housing Resilience Social Support  ADL's:  Intact  Cognition:  WNL  Sleep:  Number of Hours: 5.75     Treatment Plan Summary: Daily contact with patient to assess and evaluate symptoms and progress in treatment, Medication management and Plan : Patient is seen and examined.  Patient is a 74 year old female with the above-stated past psychiatric history who is seen in follow-up.   Diagnosis: #1 schizophrenia, #2 type 2 diabetes, #3 hypertension, #4 hyperthyroidism, #5 restless leg syndrome, #6 peripheral neuropathy, #7 chronic pain  Patient is seen in follow-up.  She is slightly improved, and more open to consideration of medication to help with her hallucinations and delusions.  Will incrementally increase Haldol, an additional 5 mg today, then increase to 5 mg in the morning and 10 mg at night. I have added Cogentin 2 mg IM or p.o. as needed should  dystonia develop, at which point dose can be decreased.  With regard to her medical issues on 05/01/2020:  decrease her Lantus to 15 units subcutaneous nightly.  stop the glipizide XL, place her on the short acting glipizide 5 mg p.o. twice daily  Stop methimazole at this point for her hyperthyroidism.    Primary psychiatry may want to get a medicine consultation and consider an internal medicine consult and consideration of nuclear scan of her thyroid.  1.  Continue amlodipine 10 mg p.o. daily for hypertension 2.  Continue glipizide short acting 5 mg p.o. twice daily for diabetes mellitus. 3.  05/01/2020 increase haloperidol to 2 mg p.o. daily and 7.5 mg p.o. nightly for psychosis. 4.  05/02/2020-additional dose of Haldol 5 mg followed by Haldol 5 mg every morning and 10 mg nightly for psychosis. 5.  Continue hydrochlorothiazide 25 mg p.o. daily for hypertension and probable some degree of heart failure. 6.  Continue sliding scale insulin for diabetes  mellitus. 7.  Continue Lantus insulin to 15 units subcutaneous nightly for diabetes mellitus. 8.  Continue Lyrica 50 mg p.o. twice daily for peripheral neuropathy. 9.  Continue Requip 1 mg p.o. nightly for restless leg syndrome. 10.  Continue tramadol 50 mg p.o. every 8 hours as needed pain. 11.  Labs reviewed: Electrolyte panel and BNP  BNP improved at 23; BMP within normal limits with slightly low creatinine  RPR pending 12.  Consideration of medicine consult and possible nuclear scan of thyroid secondary to new diagnosis of hyperthyroidism in the presence of her psychiatric condition. 13.  Disposition planning-in progress.  Mariel Craft, MD 05/02/2020, 2:51 PM

## 2020-05-02 NOTE — Plan of Care (Signed)
  Problem: Education: Goal: Emotional status will improve Outcome: Progressing Goal: Mental status will improve Outcome: Progressing   Problem: Activity: Goal: Interest or engagement in activities will improve Outcome: Progressing Goal: Sleeping patterns will improve Outcome: Progressing   Problem: Coping: Goal: Ability to verbalize frustrations and anger appropriately will improve Outcome: Progressing Goal: Ability to demonstrate self-control will improve Outcome: Progressing   Problem: Health Behavior/Discharge Planning: Goal: Compliance with treatment plan for underlying cause of condition will improve Outcome: Progressing   Problem: Physical Regulation: Goal: Ability to maintain clinical measurements within normal limits will improve Outcome: Progressing

## 2020-05-02 NOTE — Progress Notes (Signed)
F - Decrease delusional thoughts.  D - The patient was calm, cooperative, and pleasant.  She denied thoughts of harming herself/others, attended groups, and socialized with peers.  Tavie stated that she hopes to leave the hospital soon.  She continues to endorse delusional thought and feels that others are out to harm her.  Self-care appropriate.  No unsafe behaviors observed.    A - Support and medications provided.    R - Continue with care.

## 2020-05-02 NOTE — Plan of Care (Signed)
  Problem: Education: Goal: Emotional status will improve Outcome: Progressing Goal: Mental status will improve Outcome: Progressing Goal: Verbalization of understanding the information provided will improve Outcome: Progressing   Problem: Activity: Goal: Interest or engagement in activities will improve Outcome: Progressing   Problem: Coping: Goal: Ability to verbalize frustrations and anger appropriately will improve Outcome: Progressing Goal: Ability to demonstrate self-control will improve Outcome: Progressing   Problem: Health Behavior/Discharge Planning: Goal: Compliance with treatment plan for underlying cause of condition will improve Outcome: Progressing   

## 2020-05-02 NOTE — Plan of Care (Signed)
The patient is calm and cooperative.  Problem: Education: Goal: Emotional status will improve Outcome: Progressing Goal: Mental status will improve Outcome: Progressing   

## 2020-05-02 NOTE — [facility] Group Notes (Signed)
LCSW Wellness Group Note   05/02/2020 10:45am  Type of Group and Topic: Psychoeducational Group:  Wellness  Participation Level:  Active  Description of Group  Wellness group introduces the topic and its focus on developing healthy habits across the spectrum and its relationship to a decrease in hospital admissions.  Six areas of wellness are discussed: physical, social spiritual, intellectual, occupational, and emotional.  Patients are asked to consider their current wellness habits and to identify areas of wellness where they are interested and able to focus on improvements.    Therapeutic Goals 1. Patients will understand components of wellness and how they can positively impact overall health.  2. Patients will identify areas of wellness where they have developed good habits. 3. Patients will identify areas of wellness where they would like to make improvements.    Summary of Patient Progress: Pt was active and engaged in group, was willing to read aloud from the handout, and made good comments.  Pt identified spiritual and social as wellness areas that she felt good about currently.  Good participation overall.      Therapeutic Modalities: Cognitive Behavioral Therapy Psychoeducation    Lorri Frederick, LCSW

## 2020-05-02 NOTE — Progress Notes (Signed)
Patient alert and oriented. She received prescribed medications and tolerated without incident.  She has been active on the unit interacting well with others on the unit and attending group.  Denies SI/HI/VH anxiety and depression this encounter.  Continues to have AH and exhibit delusions related to people following her and "saying all kind of crazy things" When inquired as to what was being told to her she declined to go into details. Stating she was tired and needed to rest her mind and her body. Patient remains safe on the unit with 15 minute safety checks and informed to contact staff with any concerns.

## 2020-05-02 NOTE — [facility] Suicide Risk Assessment (Signed)
[facility] INPATIENT:  Family/Significant Other Suicide Prevention Education  Suicide Prevention Education:  Education Completed;  Lauretta Grill, sister, [phone], has been identified by the patient as the family member/significant other with whom the patient will be residing, and identified as the person(s) who will aid the patient in the event of a mental health crisis (suicidal ideations/suicide attempt).  With written consent from the patient, the family member/significant other has been provided the following suicide prevention education, prior to the and/or following the discharge of the patient.  The suicide prevention education provided includes the following:  Suicide risk factors  Suicide prevention and interventions  National Suicide Hotline telephone number  [facility] assessment telephone number  [facility] Emergency Assistance 911  [facility] and/or Residential Mobile Crisis Unit telephone number  Request made of family/significant other to:  Remove weapons (e.g., guns, rifles, knives), all items previously/currently identified as safety concern.  No guns in the home, per sister.    Remove drugs/medications (over-the-counter, prescriptions, illicit drugs), all items previously/currently identified as a safety concern.  The family member/significant other verbalizes understanding of the suicide prevention education information provided.  The family member/significant other agrees to remove the items of safety concern listed above.  Sister lives across the street from pt, sees her regularly and checks in regularly.  Sister reports that pt has been fine during the day but has strange thoughts, "sort of like dreams" at night.  She has not talked with pt since Thursday.  We discussed pt follow up plan and sister will go with her to her follow up appt if she is allowed to (covid).  Sister reports that pt car is at the hospital and that pt drove here prior to  admission.   Lorri Frederick 05/02/2020, 2:11 PM

## 2020-05-03 LAB — GLUCOSE, CAPILLARY
Glucose-Capillary: 112 mg/dL — ABNORMAL HIGH (ref 70–99)
Glucose-Capillary: 93 mg/dL (ref 70–99)
Glucose-Capillary: 116 mg/dL — ABNORMAL HIGH (ref 70–99)
Glucose-Capillary: 122 mg/dL — ABNORMAL HIGH (ref 70–99)

## 2020-05-03 LAB — RPR: RPR Ser Ql: NONREACTIVE

## 2020-05-03 MED ORDER — DIPHENHYDRAMINE HCL 25 MG PO CAPS
25.0000 mg | ORAL_CAPSULE | Freq: Four times a day (QID) | ORAL | Status: DC | PRN
  Administered 2020-05-03: 25 mg via ORAL
  Filled 2020-05-03: qty 1

## 2020-05-03 NOTE — BH Assessment (Signed)
[facility] Group Notes:  (Nursing/MHT/Case Management/Adjunct)  Date:  05/03/2020  Time:  9:17 PM  Type of Therapy:  Group Therapy  Participation Level:  Active  Participation Quality:  Appropriate  Affect:  Appropriate  Cognitive:  Alert  Insight:  Good  Engagement in Group:  Engaged and talking about being discharge tomorrow.  Modes of Intervention:  Support  Summary of Progress/Problems:  Mayra Neer 05/03/2020, 9:17 PM

## 2020-05-03 NOTE — BH Assessment (Signed)
[facility] Group Notes:  (Nursing/MHT/Case Management/Adjunct)  Date:  05/03/2020  Time:  9:03 PM  Type of Therapy:  Group Therapy  Participation Level:  Active  Participation Quality:  Appropriate  Affect:  Appropriate  Cognitive:  Alert  Insight:  Good  Engagement in Group:  Engaged and talking about discharge tomorrow.  Modes of Intervention:  Support  Summary of Progress/Problems:  Marilyn Weber 05/03/2020, 9:03 PM

## 2020-05-03 NOTE — Progress Notes (Signed)
Patient refused her scheduled Lyrica, stating "I don't take that, I take Jardiance". MD will be notified.

## 2020-05-03 NOTE — Plan of Care (Addendum)
D- Patient alert and oriented. Patient presented in a pleasant mood on assessment stating that she couldn't sleep last night because of her "restless legs". Patient had complaints of lower back pain, in which she did request medication from this Clinical research associate. Patient endorsed VH, reporting "I'm seeing something". Patient also endorsed depression and anxiety, stating " I take medication for it", and "a lot of people are talking about me". Patient denies SI, HI, AH. Patient's goal for today is that she wants "to go home, feel better at home".  A- Scheduled medications administered to patient, per MD orders. Support and encouragement provided.  Routine safety checks conducted every 15 minutes.  Patient informed to notify staff with problems or concerns.  R- No adverse drug reactions noted. Patient contracts for safety at this time. Patient compliant with medications and treatment plan. Patient receptive, calm, and cooperative. Patient interacts well with others on the unit.  Patient remains safe at this time.  Problem: Education: Goal: Knowledge of [facility] [place] [facility] Education information/materials will improve Outcome: Not Progressing Goal: Emotional status will improve Outcome: Not Progressing Goal: Mental status will improve Outcome: Not Progressing Goal: Verbalization of understanding the information provided will improve Outcome: Not Progressing   Problem: Activity: Goal: Interest or engagement in activities will improve Outcome: Not Progressing Goal: Sleeping patterns will improve Outcome: Not Progressing   Problem: Coping: Goal: Ability to verbalize frustrations and anger appropriately will improve Outcome: Not Progressing Goal: Ability to demonstrate self-control will improve Outcome: Not Progressing   Problem: Health Behavior/Discharge Planning: Goal: Identification of resources available to assist in meeting health care needs will improve Outcome: Not Progressing Goal: Compliance  with treatment plan for underlying cause of condition will improve Outcome: Not Progressing   Problem: Physical Regulation: Goal: Ability to maintain clinical measurements within normal limits will improve Outcome: Not Progressing

## 2020-05-03 NOTE — [facility] Group Notes (Signed)
LCSW Group Therapy Note 05/03/2020 1:15pm  Type of Therapy and Topic: Group Therapy: Feelings Around Returning Home & Establishing a Supportive Framework and Supporting Oneself When Supports Not Available  Participation Level: Active  Description of Group:  Patients first processed thoughts and feelings about upcoming discharge. These included fears of upcoming changes, lack of change, new living environments, judgements and expectations from others and overall stigma of mental health issues. The group then discussed the definition of a supportive framework, what that looks and feels like, and how do to discern it from an unhealthy non-supportive network. The group identified different types of supports as well as what to do when your family/friends are less than helpful or unavailable  Therapeutic Goals  1. Patient will identify one healthy supportive network that they can use at discharge. 2. Patient will identify one factor of a supportive framework and how to tell it from an unhealthy network. 3. Patient able to identify one coping skill to use when they do not have positive supports from others. 4. Patient will demonstrate ability to communicate their needs through discussion and/or role plays.  Summary of Patient Progress:  Pt engaged during group session. As patients processed their anxiety about discharge and described healthy supports patient shared she is ready to be discharge.  Patients identified at least one self-care tool they were willing to use after discharge.   Therapeutic Modalities Cognitive Behavioral Therapy Motivational Interviewing   Johnnye Sima, LCSW 05/03/2020 12:33 PM
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
## 2020-05-04 DIAGNOSIS — F203 Undifferentiated schizophrenia: Secondary | ICD-10-CM

## 2020-05-04 LAB — GLUCOSE, CAPILLARY
Glucose-Capillary: 164 mg/dL — ABNORMAL HIGH (ref 70–99)
Glucose-Capillary: 80 mg/dL (ref 70–99)
Glucose-Capillary: 68 mg/dL — ABNORMAL LOW (ref 70–99)
Glucose-Capillary: 109 mg/dL — ABNORMAL HIGH (ref 70–99)
Glucose-Capillary: 98 mg/dL (ref 70–99)
Glucose-Capillary: 99 mg/dL (ref 70–99)

## 2020-05-04 MED ORDER — HALOPERIDOL 5 MG PO TABS
5.0000 mg | ORAL_TABLET | Freq: Two times a day (BID) | ORAL | Status: DC
  Administered 2020-05-04 – 2020-05-05 (×2): 5 mg via ORAL
  Filled 2020-05-04 (×2): qty 1

## 2020-05-04 NOTE — Progress Notes (Signed)
Inpatient Diabetes Program Recommendations  AACE/ADA: New Consensus Statement on Inpatient Glycemic Control   Target Ranges:  Prepandial:   less than 140 mg/dL      Peak postprandial:   less than 180 mg/dL (1-2 hours)      Critically ill patients:  140 - 180 mg/dL   Results for Marilyn Weber, KREHER (MRN [id number]) as of 05/04/2020 11:46  Ref. Range 05/03/2020 07:03 05/03/2020 11:27 05/03/2020 16:03 05/03/2020 20:16 05/04/2020 06:59 05/04/2020 11:17  Glucose-Capillary Latest Ref Range: 70 - 99 mg/dL 93 276 (H) 701 (H) 100 (H) 80 68 (L)   Review of Glycemic Control  Diabetes history: DM2 Outpatient Diabetes medications: Glipizide XL 10 mg QAM, Jardiance 25 mg daily Current orders for Inpatient glycemic control: Lantus 15 units QHS, Glipizide 5 mg BID, Novolog 0-9 units TID with meals  Inpatient Diabetes Program Recommendations:    Insulin-Basal: Please consider decreasing Lantus to 10 units QHS.  Thanks, Orlando Penner, RN, MSN, CDE Diabetes Coordinator Inpatient Diabetes Program [phone] (Team Pager from 8am to 5pm)

## 2020-05-04 NOTE — Progress Notes (Signed)
[facility] MD Progress Note  05/04/2020 5:34 PM Marilyn Weber  MRN:  [id number] Subjective: Follow-up for this patient with schizophrenia.  Patient seen chart reviewed.  She seems to be doing much better.  Denies hallucinations.  I pointed out that she still has lots of paper stuck in her ear and she tells me that is because her ears are hurting not because she is having hallucinations anymore.  She generally has been behaving okay getting along all right on the unit and has been compliant with medicine.  She still uses a walker.  Blood pressure is under adequate control for the most part blood sugars still less than ideal.  We are not starting any medicine for her hyperthyroidism but I spent some time describing the situation to her. Principal Problem: Schizophrenia (HCC) Diagnosis: Principal Problem:   Schizophrenia (HCC) Active Problems:   Paranoia (HCC)   Auditory hallucinations   Delusion (HCC)   DM (diabetes mellitus) type 2, uncontrolled, with ketoacidosis (HCC)   Essential hypertension   Hyperthyroidism   Restless leg syndrome   Hereditary and idiopathic peripheral neuropathy   Chronic pain syndrome  Total Time spent with patient: 30 minutes  Past Psychiatric History: Past history of recurrent episodes of psychosis  Past Medical History:  Past Medical History:  Diagnosis Date  . Asthma   . Diabetes mellitus without complication (HCC)   . Hypertension     Past Surgical History:  Procedure Laterality Date  . ABDOMINAL HYSTERECTOMY    . BREAST CYST ASPIRATION Bilateral    Family History:  Family History  Problem Relation Age of Onset  . Lung cancer Brother   . Breast cancer Neg Hx    Family Psychiatric  History: See previous Social History:  Social History   Substance and Sexual Activity  Alcohol Use Never     Social History   Substance and Sexual Activity  Drug Use Not on file    Social History   Socioeconomic History  . Marital status: Widowed    Spouse name:  Not on file  . Number of children: Not on file  . Years of education: Not on file  . Highest education level: Not on file  Occupational History  . Not on file  Tobacco Use  . Smoking status: Never Smoker  . Smokeless tobacco: Never Used  Substance and Sexual Activity  . Alcohol use: Never  . Drug use: Not on file  . Sexual activity: Not on file  Other Topics Concern  . Not on file  Social History Narrative  . Not on file   Social Determinants of Health   Financial Resource Strain:   . Difficulty of Paying Living Expenses:   Food Insecurity:   . Worried About Programme researcher, broadcasting/film/video in the Last Year:   . Barista in the Last Year:   Transportation Needs:   . Freight forwarder (Medical):   Marland Kitchen Lack of Transportation (Non-Medical):   Physical Activity:   . Days of Exercise per Week:   . Minutes of Exercise per Session:   Stress:   . Feeling of Stress :   Social Connections:   . Frequency of Communication with Friends and Family:   . Frequency of Social Gatherings with Friends and Family:   . Attends Religious Services:   . Active Member of Clubs or Organizations:   . Attends Banker Meetings:   Marland Kitchen Marital Status:    Additional Social History:  Sleep: Fair  Appetite:  Fair  Current Medications: Current Facility-Administered Medications  Medication Dose Route Frequency Provider Last Rate Last Admin  . acetaminophen (TYLENOL) tablet 650 mg  650 mg Oral Q6H PRN Gillermo Murdoch, NP   650 mg at 05/03/20 2151  . alum & mag hydroxide-simeth (MAALOX/MYLANTA) 200-200-20 MG/5ML suspension 30 mL  30 mL Oral Q4H PRN Gillermo Murdoch, NP      . amLODipine (NORVASC) tablet 10 mg  10 mg Oral Daily Antonieta Pert, MD   10 mg at 05/04/20 0814  . benztropine (COGENTIN) tablet 2 mg  2 mg Oral BID PRN Mariel Craft, MD       Or  . benztropine mesylate (COGENTIN) injection 2 mg  2 mg Intramuscular BID PRN Mariel Craft, MD      . diphenhydrAMINE (BENADRYL) capsule 25 mg  25 mg Oral Q6H PRN Mariel Craft, MD   25 mg at 05/03/20 2151  . glipiZIDE (GLUCOTROL) tablet 5 mg  5 mg Oral BID AC Antonieta Pert, MD   5 mg at 05/04/20 1633  . haloperidol (HALDOL) tablet 5 mg  5 mg Oral BID Clapacs, Jackquline Denmark, MD   5 mg at 05/04/20 1633  . hydrochlorothiazide (HYDRODIURIL) tablet 25 mg  25 mg Oral Daily Antonieta Pert, MD   25 mg at 05/04/20 0815  . insulin aspart (novoLOG) injection 0-9 Units  0-9 Units Subcutaneous TID WC Antonieta Pert, MD   2 Units at 04/30/20 1852  . insulin glargine (LANTUS) injection 15 Units  15 Units Subcutaneous QHS Antonieta Pert, MD   15 Units at 05/03/20 2153  . magnesium hydroxide (MILK OF MAGNESIA) suspension 30 mL  30 mL Oral Daily PRN Gillermo Murdoch, NP      . traMADol Janean Sark) tablet 50 mg  50 mg Oral Q8H PRN Antonieta Pert, MD   50 mg at 05/03/20 2152    Lab Results:  Results for orders placed or performed during the hospital encounter of 04/30/20 (from the past 48 hour(s))  Glucose, capillary     Status: Abnormal   Collection Time: 05/02/20  8:37 PM  Result Value Ref Range   Glucose-Capillary 112 (H) 70 - 99 mg/dL    Comment: Glucose reference range applies only to samples taken after fasting for at least 8 hours.  Glucose, capillary     Status: None   Collection Time: 05/03/20  7:03 AM  Result Value Ref Range   Glucose-Capillary 93 70 - 99 mg/dL    Comment: Glucose reference range applies only to samples taken after fasting for at least 8 hours.   Comment 1 Notify RN   Glucose, capillary     Status: Abnormal   Collection Time: 05/03/20 11:27 AM  Result Value Ref Range   Glucose-Capillary 116 (H) 70 - 99 mg/dL    Comment: Glucose reference range applies only to samples taken after fasting for at least 8 hours.  Glucose, capillary     Status: Abnormal   Collection Time: 05/03/20  4:03 PM  Result Value Ref Range   Glucose-Capillary 122 (H) 70 - 99  mg/dL    Comment: Glucose reference range applies only to samples taken after fasting for at least 8 hours.  Glucose, capillary     Status: Abnormal   Collection Time: 05/03/20  8:16 PM  Result Value Ref Range   Glucose-Capillary 164 (H) 70 - 99 mg/dL    Comment: Glucose reference range applies only to  samples taken after fasting for at least 8 hours.   Comment 1 Notify RN   Glucose, capillary     Status: None   Collection Time: 05/04/20  6:59 AM  Result Value Ref Range   Glucose-Capillary 80 70 - 99 mg/dL    Comment: Glucose reference range applies only to samples taken after fasting for at least 8 hours.   Comment 1 Notify RN   Glucose, capillary     Status: Abnormal   Collection Time: 05/04/20 11:17 AM  Result Value Ref Range   Glucose-Capillary 68 (L) 70 - 99 mg/dL    Comment: Glucose reference range applies only to samples taken after fasting for at least 8 hours.   Comment 1 Document in Chart   Glucose, capillary     Status: Abnormal   Collection Time: 05/04/20 12:07 PM  Result Value Ref Range   Glucose-Capillary 109 (H) 70 - 99 mg/dL    Comment: Glucose reference range applies only to samples taken after fasting for at least 8 hours.    Blood Alcohol level:  Lab Results  Component Value Date   ETH <10 04/29/2020    Metabolic Disorder Labs: Lab Results  Component Value Date   HGBA1C 6.2 (H) 04/29/2020   MPG 131.24 04/29/2020   No results found for: PROLACTIN Lab Results  Component Value Date   CHOL 194 03/10/2014   TRIG 148 03/10/2014   HDL 62 (H) 03/10/2014   VLDL 30 03/10/2014   LDLCALC 102 (H) 03/10/2014    Physical Findings: AIMS:  , ,  ,  ,    CIWA:    COWS:     Musculoskeletal: Strength & Muscle Tone: within normal limits Gait & Station: normal Patient leans: N/A  Psychiatric Specialty Exam: Physical Exam  Nursing note and vitals reviewed. Constitutional: She appears well-developed and well-nourished.  HENT:  Head: Normocephalic and  atraumatic.  Eyes: Pupils are equal, round, and reactive to light. Conjunctivae are normal.  Cardiovascular: Regular rhythm and normal heart sounds.  Respiratory: Effort normal. No respiratory distress.  GI: Soft.  Musculoskeletal:        General: Normal range of motion.     Cervical back: Normal range of motion.  Neurological: She is alert.  Skin: Skin is warm and dry.  Psychiatric: Judgment normal. Her affect is blunt. Her speech is delayed. She is slowed. Thought content is paranoid. Cognition and memory are impaired. She expresses no homicidal and no suicidal ideation.    Review of Systems  Constitutional: Negative.   HENT: Negative.   Eyes: Negative.   Respiratory: Negative.   Cardiovascular: Negative.   Gastrointestinal: Negative.   Musculoskeletal: Negative.   Skin: Negative.   Neurological: Negative.   Psychiatric/Behavioral: Negative.     Blood pressure 136/70, pulse (!) 103, temperature 98.4 F (36.9 C), temperature source Oral, resp. rate 18, height 5\' 4"  (1.626 m), weight 68.9 kg, SpO2 99 %.Body mass index is 26.09 kg/m.  General Appearance: Casual  Eye Contact:  Fair  Speech:  Slow  Volume:  Decreased  Mood:  Euthymic  Affect:  Congruent  Thought Process:  Goal Directed  Orientation:  Full (Time, Place, and Person)  Thought Content:  Logical  Suicidal Thoughts:  No  Homicidal Thoughts:  No  Memory:  Immediate;   Fair Recent;   Fair Remote;   Fair  Judgement:  Fair  Insight:  Fair  Psychomotor Activity:  Decreased  Concentration:  Concentration: Fair  Recall:  of  Knowledge:  Fair  Language:  Fair  Akathisia:  No  Handed:  Right  AIMS (if indicated):     Assets:  Desire for Improvement  ADL's:  Intact  Cognition:  WNL  Sleep:  Number of Hours: 7     Treatment Plan Summary: Daily contact with patient to assess and evaluate symptoms and progress in treatment, Medication management and Plan 74 year old woman with history of psychosis.   Appears to be doing much better.  I will go ahead and initiate plans for likely discharge tomorrow with outpatient follow-up.  Supportive counseling therapy and review of treatment plan with patient  Alethia Berthold, MD 05/04/2020, 5:34 PM

## 2020-05-04 NOTE — Plan of Care (Signed)
Patient is pleasant and cooperative on approach.In & out of room with walker.Patient stated that the voices not bothering her today.Denies SI and HI.Compliant with medications.Appetite and energy level.Support and encouragement given.

## 2020-05-04 NOTE — Plan of Care (Signed)
  Problem: Education: Goal: Knowledge of [facility] Education information/materials will improve Outcome: Not Progressing Goal: Emotional status will improve Outcome: Not Progressing Goal: Mental status will improve Outcome: Not Progressing Goal: Verbalization of understanding the information provided will improve Outcome: Not Progressing   Problem: Activity: Goal: Interest or engagement in activities will improve Outcome: Not Progressing Goal: Sleeping patterns will improve Outcome: Not Progressing   Problem: Coping: Goal: Ability to verbalize frustrations and anger appropriately will improve Outcome: Not Progressing Goal: Ability to demonstrate self-control will improve Outcome: Not Progressing   Problem: Health Behavior/Discharge Planning: Goal: Identification of resources available to assist in meeting health care needs will improve Outcome: Not Progressing Goal: Compliance with treatment plan for underlying cause of condition will improve Outcome: Not Progressing   Problem: Physical Regulation: Goal: Ability to maintain clinical measurements within normal limits will improve Outcome: Not Progressing

## 2020-05-04 NOTE — Progress Notes (Signed)
Patient is pleasant and easy to engage. She denies SI/HI/VH and depression during this encounter. She endorses AH and is pleasantly delusional a she discusses the people that she reports continue to be after her. She reports that the "people"  are causing her anxiety, but states it nothing she can't handle.  She received her prescribed meds and tolerated without incident.  Tramadol was given for complaints of pain in hip and lower back. She remains safe at this time with 15v minute safety checks and informed to contact staff wit hany concerns.

## 2020-05-04 NOTE — Progress Notes (Signed)
Recreation Therapy Notes  Date: 05/04/2020  Time: 9:30 am  Location: Craft room   Behavioral response: Appropriate   Intervention Topic: Coping Skills   Discussion/Intervention:  Group content on today was focused on coping skills. The group defined what coping skills are and when they can be used. Individuals described how they normally cope with thing and the coping skills they normally use. Patients expressed why it is important to cope with things and how not coping with things can affect you. The group participated in the intervention "My coping box" and made coping boxes while adding coping skills they could use in the future to the box. Clinical Observations/Feedback:  Patient came to group and was focused on what peers and staff had to say about coping skills. Individual was social with peers and staff while participating in the intervention.   Kiran Lapine LRT/CTRS            Gwin Eagon 05/04/2020 11:17 AM

## 2020-05-04 NOTE — [facility] Group Notes (Signed)
Balance In Life 05/04/2020 9:30AM/1PM  Type of Therapy/Topic:  Group Therapy:  Balance in Life  Participation Level:  Minimal  Description of Group:   This group will address the concept of balance and how it feels and looks when one is unbalanced. Patients will be encouraged to process areas in their lives that are out of balance and identify reasons for remaining unbalanced. Facilitators will guide patients in utilizing problem-solving interventions to address and correct the stressor making their life unbalanced. Understanding and applying boundaries will be explored and addressed for obtaining and maintaining a balanced life. Patients will be encouraged to explore ways to assertively make their unbalanced needs known to significant others in their lives, using other group members and facilitator for support and feedback.  Therapeutic Goals: 1. Patient will identify two or more emotions or situations they have that consume much of in their lives. 2. Patient will identify signs/triggers that life has become out of balance:  3. Patient will identify two ways to set boundaries in order to achieve balance in their lives:  4. Patient will demonstrate ability to communicate their needs through discussion and/or role plays  Summary of Patient Progress: Minimal participation during group activity. Patient sat quietly and respected boundaries during session. Patient demonstrated minimal understanding of group topic.   Therapeutic Modalities:   Cognitive Behavioral Therapy Solution-Focused Therapy Assertiveness Training  DARREN Philip Aspen, LCSW

## 2020-05-05 LAB — GLUCOSE, CAPILLARY
Glucose-Capillary: 107 mg/dL — ABNORMAL HIGH (ref 70–99)
Glucose-Capillary: 80 mg/dL (ref 70–99)

## 2020-05-05 MED ORDER — INSULIN GLARGINE 100 UNIT/ML ~~LOC~~ SOLN: 15.0000 [IU] | Freq: Every day | SUBCUTANEOUS | 1 refills | Status: AC

## 2020-05-05 MED ORDER — GLIPIZIDE 5 MG PO TABS: 5.0000 mg | ORAL_TABLET | Freq: Two times a day (BID) | ORAL | 1 refills | Status: DC

## 2020-05-05 MED ORDER — HALOPERIDOL 5 MG PO TABS: 5.0000 mg | ORAL_TABLET | Freq: Two times a day (BID) | ORAL | 1 refills | Status: DC

## 2020-05-05 MED ORDER — AMLODIPINE BESYLATE 10 MG PO TABS: 10.0000 mg | ORAL_TABLET | Freq: Every day | ORAL | 1 refills | Status: DC

## 2020-05-05 MED ORDER — TRAMADOL HCL 50 MG PO TABS: 50.0000 mg | ORAL_TABLET | Freq: Three times a day (TID) | ORAL | 1 refills | Status: DC | PRN

## 2020-05-05 MED ORDER — HYDROCHLOROTHIAZIDE 25 MG PO TABS: 25.0000 mg | ORAL_TABLET | Freq: Every day | ORAL | 1 refills | Status: AC

## 2020-05-05 NOTE — [facility] Suicide Risk Assessment (Signed)
[facility] Discharge Suicide Risk Assessment   Principal Problem: Schizophrenia [facility]) Discharge Diagnoses: Principal Problem:   Schizophrenia (HCC) Active Problems:   Paranoia (HCC)   Auditory hallucinations   Delusion (HCC)   DM (diabetes mellitus) type 2, uncontrolled, with ketoacidosis (HCC)   Essential hypertension   Hyperthyroidism   Restless leg syndrome   Hereditary and idiopathic peripheral neuropathy   Chronic pain syndrome   Total Time spent with patient: 30 minutes  Musculoskeletal: Strength & Muscle Tone: within normal limits Gait & Station: normal Patient leans: N/A  Psychiatric Specialty Exam: Review of Systems  Constitutional: Negative.   HENT: Negative.   Eyes: Negative.   Respiratory: Negative.   Cardiovascular: Negative.   Gastrointestinal: Negative.   Musculoskeletal: Negative.   Skin: Negative.   Neurological: Negative.   Psychiatric/Behavioral: Negative.     Blood pressure 130/71, pulse (!) 106, temperature 98.8 F (37.1 C), temperature source Oral, resp. rate 17, height 5\' 4"  (1.626 m), weight 68.9 kg, SpO2 99 %.Body mass index is 26.09 kg/m.  General Appearance: Casual  Eye Contact::  Good  Speech:  Clear and Coherent409  Volume:  Normal  Mood:  Euthymic  Affect:  Constricted  Thought Process:  Goal Directed  Orientation:  Full (Time, Place, and Person)  Thought Content:  Logical  Suicidal Thoughts:  No  Homicidal Thoughts:  No  Memory:  Immediate;   Fair Recent;   Fair Remote;   Fair  Judgement:  Fair  Insight:  Fair  Psychomotor Activity:  Normal  Concentration:  Fair  Recall:  [phone] of Knowledge:Fair  Language: Fair  Akathisia:  No  Handed:  Right  AIMS (if indicated):     Assets:  Desire for Improvement Housing Resilience Social Support  Sleep:  Number of Hours: 7.75  Cognition: WNL  ADL's:  Intact   Mental Status Per Nursing Assessment::   On Admission:  NA  Demographic Factors:  NA  Loss  Factors: NA  Historical Factors: NA  Risk Reduction Factors:   Sense of responsibility to family, Positive social support and Positive therapeutic relationship  Continued Clinical Symptoms:  Schizophrenia:   Paranoid or undifferentiated type  Cognitive Features That Contribute To Risk:  None    Suicide Risk:  Minimal: No identifiable suicidal ideation.  Patients presenting with no risk factors but with morbid ruminations; may be classified as minimal risk based on the severity of the depressive symptoms    Plan Of Care/Follow-up recommendations:  Activity:  Activity as tolerated Diet:  Diabetic diet Other:  Follow-up with outpatient medical care  [phone], MD 05/05/2020, 9:48 AM

## 2020-05-05 NOTE — Progress Notes (Signed)
Patient pleasant and cooperative. Slept through the night. Denies SI, HI and AVH

## 2020-05-05 NOTE — Discharge Summary (Signed)
Physician Discharge Summary Note  Patient:  Marilyn Weber is an 74 y.o., female MRN:  [id number] DOB:  09/10/1946 Patient phone:  [phone] (home)  Patient address:   [place] [address],  Total Time spent with patient: 30 minutes  Date of Admission:  04/30/2020 Date of Discharge: May 05, 2020  Reason for Admission: Patient was admitted because of worsening psychotic symptoms including mood instability paranoia and hallucinations  Principal Problem: Schizophrenia [facility]) Discharge Diagnoses: Principal Problem:   Schizophrenia ([place]) Active Problems:   Paranoia ([place])   Auditory hallucinations   Delusion ([place])   DM (diabetes mellitus) type 2, uncontrolled, with ketoacidosis ([place])   Essential hypertension   Hyperthyroidism   Restless leg syndrome   Hereditary and idiopathic peripheral neuropathy   Chronic pain syndrome   Past Psychiatric History: Past history of schizophrenia diagnosis with recurrent paranoia.  Past Medical History:  Past Medical History:  Diagnosis Date  . Asthma   . Diabetes mellitus without complication ([place])   . Hypertension     Past Surgical History:  Procedure Laterality Date  . ABDOMINAL HYSTERECTOMY    . BREAST CYST ASPIRATION Bilateral    Family History:  Family History  Problem Relation Age of Onset  . Lung cancer Brother   . Breast cancer Neg Hx    Family Psychiatric  History: See previous Social History:  Social History   Substance and Sexual Activity  Alcohol Use Never     Social History   Substance and Sexual Activity  Drug Use Not on file    Social History   Socioeconomic History  . Marital status: Widowed    Spouse name: Not on file  . Number of children: Not on file  . Years of education: Not on file  . Highest education level: Not on file  Occupational History  . Not on file  Tobacco Use  . Smoking status: Never Smoker  . Smokeless tobacco: Never Used  Substance and Sexual Activity  .  Alcohol use: Never  . Drug use: Not on file  . Sexual activity: Not on file  Other Topics Concern  . Not on file  Social History Narrative  . Not on file   Social Determinants of Health   Financial Resource Strain:   . Difficulty of Paying Living Expenses:   Food Insecurity:   . Worried About Charity fundraiser in the Last Year:   . Arboriculturist in the Last Year:   Transportation Needs:   . Film/video editor (Medical):   Marland Kitchen Lack of Transportation (Non-Medical):   Physical Activity:   . Days of Exercise per Week:   . Minutes of Exercise per Session:   Stress:   . Feeling of Stress :   Social Connections:   . Frequency of Communication with Friends and Family:   . Frequency of Social Gatherings with Friends and Family:   . Attends Religious Services:   . Active Member of Clubs or Organizations:   . Attends Archivist Meetings:   Marland Kitchen Marital Status:     Hospital Course: Patient admitted to psychiatric unit.  Medical treatment for her diabetes high blood pressure was continued.  Patient had evidence on labs and physical exam of possible hyperthyroidism.  So far this has been worked up with laboratories and with an ultrasound.  Patient is not currently on medication for hyperthyroidism but has been educated that this is an important condition that needs to be further  followed up and treated.  She understands and is agreeable to doing that with her primary care doctor.  At this time she is tolerating modest dose haloperidol.  She was offered the option of long-acting injectable but has declined that.  She appears to be capable of making that judgment on her own.  She will be discharged back to outpatient care in the community with psychoeducation and supportive counseling and therapy  Physical Findings: AIMS:  , ,  ,  ,    CIWA:    COWS:     Musculoskeletal: Strength & Muscle Tone: within normal limits Gait & Station: normal Patient leans: N/A  Psychiatric  Specialty Exam: Physical Exam  Nursing note and vitals reviewed. Constitutional: She appears well-developed and well-nourished.  HENT:  Head: Normocephalic and atraumatic.  Eyes: Pupils are equal, round, and reactive to light. Conjunctivae are normal.  Cardiovascular: Regular rhythm and normal heart sounds.  Respiratory: Effort normal.  GI: Soft.  Musculoskeletal:        General: Normal range of motion.     Cervical back: Normal range of motion.  Neurological: She is alert.  Skin: Skin is warm and dry.  Psychiatric: She has a normal mood and affect. Her behavior is normal. Judgment and thought content normal.    Review of Systems  Constitutional: Negative.   HENT: Negative.   Eyes: Negative.   Respiratory: Negative.   Cardiovascular: Negative.   Gastrointestinal: Negative.   Musculoskeletal: Negative.   Skin: Negative.   Neurological: Negative.   Psychiatric/Behavioral: Negative.     Blood pressure 130/71, pulse (!) 106, temperature 98.8 F (37.1 C), temperature source Oral, resp. rate 17, height 5\' 4"  (1.626 m), weight 68.9 kg, SpO2 99 %.Body mass index is 26.09 kg/m.  General Appearance: Casual  Eye Contact:  Good  Speech:  Clear and Coherent  Volume:  Normal  Mood:  Euthymic  Affect:  Congruent  Thought Process:  Goal Directed  Orientation:  Full (Time, Place, and Person)  Thought Content:  Logical  Suicidal Thoughts:  No  Homicidal Thoughts:  No  Memory:  Immediate;   Fair Recent;   Fair Remote;   Fair  Judgement:  Fair  Insight:  Fair  Psychomotor Activity:  Normal  Concentration:  Concentration: Fair  Recall:  Fair  Fund of Knowledge:  Fair  Language:  Fair  Akathisia:  No  Handed:  Right  AIMS (if indicated):     Assets:  Desire for Improvement Financial Resources/Insurance Housing Resilience Social Support  ADL's:  Intact  Cognition:  WNL  Sleep:  Number of Hours: 7.75        Has this patient used any form of tobacco in the last 30 days?  (Cigarettes, Smokeless Tobacco, Cigars, and/or Pipes) Yes, No  Blood Alcohol level:  Lab Results  Component Value Date   ETH <10 04/29/2020    Metabolic Disorder Labs:  Lab Results  Component Value Date   HGBA1C 6.2 (H) 04/29/2020   MPG 131.24 04/29/2020   No results found for: PROLACTIN Lab Results  Component Value Date   CHOL 194 03/10/2014   TRIG 148 03/10/2014   HDL 62 (H) 03/10/2014   VLDL 30 03/10/2014   LDLCALC 102 (H) 03/10/2014    See Psychiatric Specialty Exam and Suicide Risk Assessment completed by Attending Physician prior to discharge.  Discharge destination:  Home  Is patient on multiple antipsychotic therapies at discharge:  No   Has Patient had three or more failed trials of  antipsychotic monotherapy by history:  No  Recommended Plan for Multiple Antipsychotic Therapies: NA  Discharge Instructions    Diet - low sodium heart healthy   Complete by: As directed    Increase activity slowly   Complete by: As directed      Allergies as of 05/05/2020      Reactions   Lisinopril Swelling   Tongue swelling   Penicillins Rash   Has patient had a PCN reaction causing immediate rash, facial/tongue/throat swelling, SOB or lightheadedness with hypotension: YesYes Has patient had a PCN reaction causing severe rash involving mucus membranes or skin necrosis: No Has patient had a PCN reaction that required hospitalization:  Has patient had a PCN reaction occurring within the last 10 years: Unknown If all of the above answers are "NO", then may proceed with Cephalosporin use.      Medication List    STOP taking these medications   glipiZIDE 10 MG 24 hr tablet Commonly known as: GLUCOTROL XL Replaced by: glipiZIDE 5 MG tablet   Medical Compression Stockings Misc   meloxicam 15 MG tablet Commonly known as: MOBIC   omeprazole 20 MG capsule Commonly known as: PRILOSEC   ondansetron 4 MG tablet Commonly known as: ZOFRAN   rOPINIRole 1 MG  tablet Commonly known as: REQUIP   traZODone 50 MG tablet Commonly known as: DESYREL     TAKE these medications     Indication  amLODipine 10 MG tablet Commonly known as: NORVASC Take 1 tablet (10 mg total) by mouth daily.  Indication: High Blood Pressure Disorder   glipiZIDE 5 MG tablet Commonly known as: GLUCOTROL Take 1 tablet (5 mg total) by mouth 2 (two) times daily before a meal. Replaces: glipiZIDE 10 MG 24 hr tablet  Indication: Type 2 Diabetes   haloperidol 5 MG tablet Commonly known as: HALDOL Take 1 tablet (5 mg total) by mouth 2 (two) times daily.  Indication: Psychosis   hydrochlorothiazide 25 MG tablet Commonly known as: HYDRODIURIL Take 1 tablet (25 mg total) by mouth daily.  Indication: High Blood Pressure Disorder   insulin glargine 100 UNIT/ML injection Commonly known as: LANTUS Inject 0.15 mLs (15 Units total) into the skin at bedtime.  Indication: Type 2 Diabetes   Jardiance 25 MG Tabs tablet Generic drug: empagliflozin Take 25 mg by mouth daily.  Indication: Type 2 Diabetes   traMADol 50 MG tablet Commonly known as: ULTRAM Take 1 tablet (50 mg total) by mouth every 8 (eight) hours as needed for moderate pain.  Indication: Pain        Follow-up recommendations:  Activity:  Activity as tolerated Diet:  Diabetic diet Other:  Follow-up with outpatient treatment as recommended  Comments: Prescriptions provided at discharge  Signed: Mordecai Rasmussen, MD 05/05/2020, 9:53 AM

## 2020-05-05 NOTE — Progress Notes (Signed)
  [facility] Adult Case Management Discharge Plan :  Will you be returning to the same living situation after discharge:  Yes,  pt reports she is returning home. At discharge, do you have transportation home?: Yes,  pt reports that her car is on campus. Do you have the ability to pay for your medications: Yes,  [facility] Medicare.  Release of information consent forms completed and in the chart;  Patient's signature needed at discharge.  Patient to Follow up at: Follow-up Information    [facility] Follow up on 05/08/2020.   Why: Appointment is scheduled for 12:30PM on 05/08/2020.  Appointment is face to face. Thanks! Contact information: [address] [place] [address] [phone]           Next level of care provider has access to [facility] Link:no  Safety Planning and Suicide Prevention discussed: Yes,  SPE completed with patient and patient's sister.     Has patient been referred to the Quitline?: N/A patient is not a smoker  Patient has been referred for addiction treatment: N/A  Harden Mo, LCSW 05/05/2020, 10:34 AM

## 2020-05-05 NOTE — Progress Notes (Signed)
Recreation Therapy Notes  Date: 05/05/2020  Time: 9:30 am   Location: Craft room    Behavioral response: N/A   Intervention Topic: Problem-Solving   Discussion/Intervention: Patient did not attend group.   Clinical Observations/Feedback:  Patient did not attend group.   Amand Lemoine LRT/CTRS         Corinna Burkman 05/05/2020 11:00 AM

## 2020-05-05 NOTE — Progress Notes (Signed)
Patient denies SI/HI, denies A/V hallucinations. Patient verbalizes understanding of discharge instructions, follow up care and prescriptions. Patient given all belongings from [facility] locker. Patient escorted out by staff, transported by cab to her car at the ED parking lot.

## 2020-05-05 NOTE — Progress Notes (Signed)
Recreation Therapy Notes  INPATIENT RECREATION TR PLAN  Patient Details Name: Marilyn Weber MRN: [id number] DOB: 12/16/1946 Today's Date: 05/05/2020  Rec Therapy Plan Is patient appropriate for Therapeutic Recreation?: Yes Treatment times per week: At least 3 Estimated Length of Stay: 5-7 days TR Treatment/Interventions: Group participation (Comment)  Discharge Criteria Pt will be discharged from therapy if:: Discharged Treatment plan/goals/alternatives discussed and agreed upon by:: Patient/family  Discharge Summary Short term goals set: Patient will engage in groups without prompting or encouragement from LRT x3 group sessions within 5 recreation therapy group sessions Short term goals met: Adequate for discharge Progress toward goals comments: Groups attended Which groups?: Coping skills, Goal setting Reason goals not met: N/A Therapeutic equipment acquired: N/A Reason patient discharged from therapy: Discharge from hospital Pt/family agrees with progress & goals achieved: Yes Date patient discharged from therapy: 05/05/20   Shaverence  Outlaw 05/05/2020, 11:48 AM

## 2020-05-05 NOTE — Plan of Care (Signed)
  Problem: Education: Goal: Knowledge of [facility] Education information/materials will improve Outcome: Progressing Goal: Emotional status will improve Outcome: Progressing Goal: Mental status will improve Outcome: Progressing Goal: Verbalization of understanding the information provided will improve Outcome: Progressing   

## 2020-05-06 LAB — METHYLMALONIC ACID, SERUM: Methylmalonic Acid, Quantitative: 439 nmol/L — ABNORMAL HIGH (ref 0–378)

## 2020-10-07 ENCOUNTER — Other Ambulatory Visit: Payer: Self-pay | Admitting: Family Medicine

## 2020-10-07 DIAGNOSIS — Z1231 Encounter for screening mammogram for malignant neoplasm of breast: Secondary | ICD-10-CM

## 2020-11-17 ENCOUNTER — Encounter: Payer: Self-pay | Admitting: Family Medicine

## 2021-01-05 ENCOUNTER — Ambulatory Visit: Payer: Medicare Other | Admitting: Gastroenterology

## 2021-02-16 ENCOUNTER — Other Ambulatory Visit: Payer: Self-pay

## 2021-02-16 ENCOUNTER — Ambulatory Visit (INDEPENDENT_AMBULATORY_CARE_PROVIDER_SITE_OTHER): Payer: Medicare Other | Admitting: Gastroenterology

## 2021-02-16 VITALS — BP 133/80 | HR 82 | Temp 98.5°F | Ht 64.0 in | Wt 197.0 lb

## 2021-02-16 DIAGNOSIS — R197 Diarrhea, unspecified: Secondary | ICD-10-CM

## 2021-02-16 DIAGNOSIS — Z1211 Encounter for screening for malignant neoplasm of colon: Secondary | ICD-10-CM

## 2021-02-16 DIAGNOSIS — R1310 Dysphagia, unspecified: Secondary | ICD-10-CM

## 2021-02-16 MED ORDER — NA SULFATE-K SULFATE-MG SULF 17.5-3.13-1.6 GM/177ML PO SOLN
1.0000 | Freq: Once | ORAL | 0 refills | Status: AC
Start: 2021-02-16 — End: 2021-02-16

## 2021-02-16 NOTE — Progress Notes (Signed)
Wyline Mood MD, MRCP(U.K) [address]  [address]  [place], [place] [address]  Main: [phone]  Fax: [phone]   Gastroenterology Consultation  Referring Provider:     Abram Sander, MD Primary Care Physician:  [facility] Primary Gastroenterologist:  Dr. Wyline Mood  Reason for Consultation:     Referred for GERD and diarrhea November 2021        HPI:   Marilyn Weber is a 75 y.o. y/o female Referred for GERD in November 2021.  Lab work in November 2021: Creatinine 1.86 alkaline phosphatase 169.  No prior colonoscopy.  She states that she has had diarrhea for many years.  As long as she takes Imodium no issues.  Last colonoscopy was over 10 years back.  Denies any blood in the stool.  She complains of difficulty swallowing.  Feels that the food gets stuck in her throat.  Has been going on for at least a few weeks.  Takes omeprazole 40 mg once a day.  Does not really take it all the time before breakfast.   Past Medical History:  Diagnosis Date  . Asthma   . Diabetes mellitus without complication (H[facility])   . Hypertension     Past Surgical History:  Procedure Laterality Date  . ABDOMINAL HYSTERECTOMY    . BREAST CYST ASPIRATION Bilateral     Prior to Admission medications   Medication Sig Start Date End Date Taking? Authorizing Provider  amLODipine (NORVASC) 10 MG tablet Take 1 tablet (10 mg total) by mouth daily. 05/05/20   Clapacs, Jackquline Denmark, MD  empagliflozin (JARDIANCE) 25 MG TABS tablet Take 25 mg by mouth daily.     [provider]  glipiZIDE (GLUCOTROL) 5 MG tablet Take 1 tablet (5 mg total) by mouth 2 (two) times daily before a meal. 05/05/20   Clapacs, Jackquline Denmark, MD  haloperidol (HALDOL) 5 MG tablet Take 1 tablet (5 mg total) by mouth 2 (two) times daily. 05/05/20   Clapacs, Jackquline Denmark, MD  hydrochlorothiazide (HYDRODIURIL) 25 MG tablet Take 1 tablet (25 mg total) by mouth daily. 05/05/20   Clapacs, Jackquline Denmark, MD  insulin glargine (LANTUS) 100  UNIT/ML injection Inject 0.15 mLs (15 Units total) into the skin at bedtime. 05/05/20   Clapacs, Jackquline Denmark, MD  traMADol (ULTRAM) 50 MG tablet Take 1 tablet (50 mg total) by mouth every 8 (eight) hours as needed for moderate pain. 05/05/20   Clapacs, Jackquline Denmark, MD    Family History  Problem Relation Age of Onset  . Lung cancer Brother   . Breast cancer Neg Hx      Social History   Tobacco Use  . Smoking status: Never Smoker  . Smokeless tobacco: Never Used  Vaping Use  . Vaping Use: Never used  Substance Use Topics  . Alcohol use: Never    Allergies as of 02/16/2021 - Review Complete 02/16/2021  Allergen Reaction Noted  . Lisinopril Swelling 10/01/2016  . Penicillins Rash 10/01/2016    Review of Systems:    All systems reviewed and negative except where noted in HPI.   Physical Exam:  BP 133/80   Pulse 82   Temp 98.5 F (36.9 C)   Ht 5\' 4"  (1.626 m)   Wt 197 lb (89.4 kg)   BMI 33.81 kg/m  No LMP recorded. Patient has had a hysterectomy. Psych:  Alert and cooperative. Normal mood and affect. General:   Alert,  Well-developed, well-nourished, pleasant and cooperative in NAD  Head:  Normocephalic and atraumatic. Eyes:  Sclera clear, no icterus.   Conjunctiva pink. Ears:  Normal auditory acuity. Lungs:  Respirations even and unlabored.  Clear throughout to auscultation.   No wheezes, crackles, or rhonchi. No acute distress. Heart:  Regular rate and rhythm; no murmurs, clicks, rubs, or gallops. Abdomen:  Normal bowel sounds.  No bruits.  Soft, non-tender and non-distended without masses, hepatosplenomegaly or hernias noted.  No guarding or rebound tenderness.    Neurologic:  Alert and oriented x3;  grossly normal neurologically. Psych:  Alert and cooperative. Normal mood and affect.  Imaging Studies: No results found.  Assessment and Plan:   Marilyn Weber is a 75 y.o. y/o female has been referred for diarrhea in November 2021 along with GERD.  Her symptoms are of chronic  diarrhea responding to Imodium and recent onset dysphagia  Plan 1.  Diagnostic colonoscopy 2.  CBC, CMP, stool studies 3.  EGD to evaluate for dysphagia   I have discussed alternative options, risks & benefits,  which include, but are not limited to, bleeding, infection, perforation,respiratory complication & drug reaction.  The patient agrees with this plan & written consent will be obtained.     Follow up in 8 to 12 weeks  Dr Wyline Mood MD,MRCP(U.K)

## 2021-02-17 LAB — CBC WITH DIFFERENTIAL/PLATELET
Basophils Absolute: 0 10*3/uL (ref 0.0–0.2)
Basos: 0 %
EOS (ABSOLUTE): 0.1 10*3/uL (ref 0.0–0.4)
Eos: 1 %
Hematocrit: 41.1 % (ref 34.0–46.6)
Hemoglobin: 13.9 g/dL (ref 11.1–15.9)
Immature Grans (Abs): 0 10*3/uL (ref 0.0–0.1)
Immature Granulocytes: 0 %
Lymphocytes Absolute: 3.6 10*3/uL — ABNORMAL HIGH (ref 0.7–3.1)
Lymphs: 39 %
MCH: 28.3 pg (ref 26.6–33.0)
MCHC: 33.8 g/dL (ref 31.5–35.7)
MCV: 84 fL (ref 79–97)
Monocytes Absolute: 0.5 10*3/uL (ref 0.1–0.9)
Monocytes: 5 %
Neutrophils Absolute: 5.2 10*3/uL (ref 1.4–7.0)
Neutrophils: 55 %
Platelets: 300 10*3/uL (ref 150–450)
RBC: 4.92 x10E6/uL (ref 3.77–5.28)
RDW: 13.6 % (ref 11.7–15.4)
WBC: 9.4 10*3/uL (ref 3.4–10.8)

## 2021-02-17 LAB — COMPREHENSIVE METABOLIC PANEL
ALT: 23 IU/L (ref 0–32)
AST: 27 IU/L (ref 0–40)
Albumin/Globulin Ratio: 1.1 — ABNORMAL LOW (ref 1.2–2.2)
Albumin: 3.8 g/dL (ref 3.7–4.7)
Alkaline Phosphatase: 134 IU/L — ABNORMAL HIGH (ref 44–121)
BUN/Creatinine Ratio: 12 (ref 12–28)
BUN: 13 mg/dL (ref 8–27)
Bilirubin Total: <0.2 mg/dL (ref 0.0–1.2)
CO2: 22 mmol/L (ref 20–29)
Calcium: 9.3 mg/dL (ref 8.7–10.3)
Chloride: 100 mmol/L (ref 96–106)
Creatinine, Ser: 1.08 mg/dL — ABNORMAL HIGH (ref 0.57–1.00)
Globulin, Total: 3.4 g/dL (ref 1.5–4.5)
Glucose: 216 mg/dL — ABNORMAL HIGH (ref 65–99)
Potassium: 4 mmol/L (ref 3.5–5.2)
Sodium: 140 mmol/L (ref 134–144)
Total Protein: 7.2 g/dL (ref 6.0–8.5)
eGFR: 55 mL/min/{1.73_m2} — ABNORMAL LOW (ref >59)

## 2021-02-24 ENCOUNTER — Telehealth: Payer: Self-pay | Admitting: Gastroenterology

## 2021-02-24 NOTE — Telephone Encounter (Signed)
Patient called asking for results, please call patient

## 2021-02-25 ENCOUNTER — Encounter: Payer: Self-pay | Admitting: Gastroenterology

## 2021-02-25 LAB — CLOSTRIDIUM DIFFICILE BY PCR: Toxigenic C. Difficile by PCR: NEGATIVE

## 2021-02-25 NOTE — Telephone Encounter (Signed)
Inform   Stool studies show no result can you check if it was sent Serum creatinine has doubled - suggest hold hydrochlorthiazide and get in touch with their pcp to recheck CMP and restart when creatinine has improved or change to a different Rx

## 2021-02-25 NOTE — Telephone Encounter (Signed)
Pt has been notified of results and Dr. Johnney Killian recommendations. Pt just submitted her stool sample yesterday afternoon.

## 2021-03-09 ENCOUNTER — Other Ambulatory Visit: Payer: Self-pay

## 2021-03-09 ENCOUNTER — Telehealth: Payer: Self-pay

## 2021-03-09 ENCOUNTER — Other Ambulatory Visit
Admission: RE | Admit: 2021-03-09 | Discharge: 2021-03-09 | Disposition: A | Discharge status: Home / Self Care | Payer: Medicare Other | Source: Ambulatory Visit | Attending: Gastroenterology | Admitting: Gastroenterology

## 2021-03-09 DIAGNOSIS — Z20822 Contact with and (suspected) exposure to covid-19: Secondary | ICD-10-CM | POA: Diagnosis not present

## 2021-03-09 DIAGNOSIS — Z01812 Encounter for preprocedural laboratory examination: Secondary | ICD-10-CM | POA: Diagnosis present

## 2021-03-09 LAB — SARS CORONAVIRUS 2 (TAT 6-24 HRS): SARS Coronavirus 2: NEGATIVE

## 2021-03-09 NOTE — Telephone Encounter (Signed)
Patient called with questions regarding the low fiber diet. I went over instructions with the patient and she verbalized understanding.

## 2021-03-10 ENCOUNTER — Encounter: Payer: Self-pay | Admitting: Gastroenterology

## 2021-03-11 ENCOUNTER — Encounter: Payer: Self-pay | Admitting: Gastroenterology

## 2021-03-11 ENCOUNTER — Ambulatory Visit
Admission: RE | Admit: 2021-03-11 | Discharge: 2021-03-11 | Disposition: A | Discharge status: Home / Self Care | Payer: Medicare Other | Attending: Gastroenterology | Admitting: Gastroenterology

## 2021-03-11 ENCOUNTER — Ambulatory Visit: Payer: Medicare Other | Admitting: Anesthesiology

## 2021-03-11 ENCOUNTER — Other Ambulatory Visit: Payer: Self-pay

## 2021-03-11 ENCOUNTER — Encounter
Admission: RE | Disposition: A | Discharge status: Home / Self Care | Payer: Self-pay | Source: Home / Self Care | Attending: Gastroenterology

## 2021-03-11 DIAGNOSIS — Z794 Long term (current) use of insulin: Secondary | ICD-10-CM | POA: Diagnosis not present

## 2021-03-11 DIAGNOSIS — Z880 Allergy status to penicillin: Secondary | ICD-10-CM | POA: Diagnosis not present

## 2021-03-11 DIAGNOSIS — K209 Esophagitis, unspecified without bleeding: Secondary | ICD-10-CM | POA: Diagnosis not present

## 2021-03-11 DIAGNOSIS — Z538 Procedure and treatment not carried out for other reasons: Secondary | ICD-10-CM | POA: Insufficient documentation

## 2021-03-11 DIAGNOSIS — Z791 Long term (current) use of non-steroidal anti-inflammatories (NSAID): Secondary | ICD-10-CM | POA: Diagnosis not present

## 2021-03-11 DIAGNOSIS — R197 Diarrhea, unspecified: Secondary | ICD-10-CM | POA: Diagnosis not present

## 2021-03-11 DIAGNOSIS — Z1211 Encounter for screening for malignant neoplasm of colon: Secondary | ICD-10-CM

## 2021-03-11 DIAGNOSIS — K222 Esophageal obstruction: Secondary | ICD-10-CM | POA: Insufficient documentation

## 2021-03-11 DIAGNOSIS — Z79899 Other long term (current) drug therapy: Secondary | ICD-10-CM | POA: Diagnosis not present

## 2021-03-11 DIAGNOSIS — K529 Noninfective gastroenteritis and colitis, unspecified: Secondary | ICD-10-CM | POA: Diagnosis not present

## 2021-03-11 DIAGNOSIS — K5669 Other partial intestinal obstruction: Secondary | ICD-10-CM | POA: Diagnosis not present

## 2021-03-11 DIAGNOSIS — K56699 Other intestinal obstruction unspecified as to partial versus complete obstruction: Secondary | ICD-10-CM | POA: Diagnosis not present

## 2021-03-11 DIAGNOSIS — R1310 Dysphagia, unspecified: Secondary | ICD-10-CM

## 2021-03-11 DIAGNOSIS — Z888 Allergy status to other drugs, medicaments and biological substances status: Secondary | ICD-10-CM | POA: Diagnosis not present

## 2021-03-11 HISTORY — DX: Hypothyroidism, unspecified: E03.9

## 2021-03-11 HISTORY — PX: COLONOSCOPY WITH PROPOFOL: SHX5780

## 2021-03-11 HISTORY — PX: ESOPHAGOGASTRODUODENOSCOPY (EGD) WITH PROPOFOL: SHX5813

## 2021-03-11 LAB — GLUCOSE, CAPILLARY: Glucose-Capillary: 113 mg/dL — ABNORMAL HIGH (ref 70–99)

## 2021-03-11 SURGERY — COLONOSCOPY WITH PROPOFOL
Anesthesia: General

## 2021-03-11 MED ORDER — PROPOFOL 500 MG/50ML IV EMUL
INTRAVENOUS | Status: DC | PRN
  Administered 2021-03-11: 150 ug/kg/min via INTRAVENOUS

## 2021-03-11 MED ORDER — PROPOFOL 500 MG/50ML IV EMUL
INTRAVENOUS | Status: AC
  Filled 2021-03-11: qty 50

## 2021-03-11 MED ORDER — SODIUM CHLORIDE 0.9 % IV SOLN: INTRAVENOUS | Status: DC

## 2021-03-11 MED ORDER — LIDOCAINE HCL (CARDIAC) PF 100 MG/5ML IV SOSY
PREFILLED_SYRINGE | INTRAVENOUS | Status: DC | PRN
  Administered 2021-03-11: 50 mg via INTRAVENOUS

## 2021-03-11 MED ORDER — PROPOFOL 10 MG/ML IV BOLUS
INTRAVENOUS | Status: DC | PRN
  Administered 2021-03-11: 60 mg via INTRAVENOUS

## 2021-03-11 NOTE — Anesthesia Procedure Notes (Signed)
Date/Time: 03/11/2021 8:49 AM Performed by: Ginger Carne, CRNA Pre-anesthesia Checklist: Patient identified, Emergency Drugs available, Suction available, Patient being monitored and Timeout performed Patient Re-evaluated:Patient Re-evaluated prior to induction Oxygen Delivery Method: Nasal cannula Preoxygenation: Pre-oxygenation with 100% oxygen Induction Type: IV induction

## 2021-03-11 NOTE — Transfer of Care (Signed)
Immediate Anesthesia Transfer of Care Note  Patient: Marilyn Weber  Procedure(s) Performed: COLONOSCOPY WITH PROPOFOL (N/A ) ESOPHAGOGASTRODUODENOSCOPY (EGD) WITH PROPOFOL (N/A )  Patient Location: PACU  Anesthesia Type:General  Level of Consciousness: drowsy  Airway & Oxygen Therapy: Patient Spontanous Breathing  Post-op Assessment: Report given to RN and Post -op Vital signs reviewed and stable  Post vital signs: Reviewed and stable  Last Vitals:  Vitals Value Taken Time  BP 144/80 03/11/21 0917  Temp 36.3 C 03/11/21 0916  Pulse 88 03/11/21 0917  Resp 17 03/11/21 0917  SpO2 97 % 03/11/21 0917  Vitals shown include unvalidated device data.  Last Pain:  Vitals:   03/11/21 0916  TempSrc: Temporal  PainSc: Asleep         Complications: No complications documented.

## 2021-03-11 NOTE — Op Note (Signed)
[facility] Patient Name: Marilyn Weber Procedure Date: 03/11/2021 8:42 AM MRN: [id number] Account #: [id number] Date of Birth: 1946-10-29 Admit Type: Outpatient Age: 75 Room: [facility] ENDO ROOM 1 Gender: Female Note Status: Finalized Procedure:             Colonoscopy Indications:           Chronic diarrhea Providers:             Wyline Mood MD, MD Referring MD:          [facility], MD (Referring MD) Medicines:             Monitored Anesthesia Care Complications:         No immediate complications. Procedure:             Pre-Anesthesia Assessment:                        - Prior to the procedure, a History and Physical was                         performed, and patient medications, allergies and                         sensitivities were reviewed. The patient's tolerance                         of previous anesthesia was reviewed.                        - The risks and benefits of the procedure and the                         sedation options and risks were discussed with the                         patient. All questions were answered and informed                         consent was obtained.                        - ASA Grade Assessment: III - A patient with severe                         systemic disease.                        After obtaining informed consent, the colonoscope was                         passed under direct vision. Throughout the procedure,                         the patient's blood pressure, pulse, and oxygen                         saturations were monitored continuously. The                         Colonoscope was introduced through the anus with  the                         intention of advancing to the transverse colon. The                         scope was advanced to the splenic flexure before the                         procedure was aborted. Medications were given. The                         patient  tolerated the procedure well. The quality of                         the bowel preparation was good. The colonoscopy was                         technically difficult and complex due to significant                         looping. Successful completion of the procedure was                         aided by withdrawing the scope and replacing with the                         pediatric colonoscope. Findings:      The perianal and digital rectal examinations were normal.      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      The exam was otherwise without abnormality on direct and retroflexion       views. 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                        - The examination was otherwise normal on direct and                         retroflexion views.                        - No specimens collected. Recommendation:        - Discharge patient to home (with escort).                        - Resume previous diet.                        - Continue present medications.                        - Obtain barium enema to determine degree and extent                         of stenosis Procedure Code(s):     --- Professional ---  40981, 53, Colonoscopy, flexible; diagnostic,                         including collection of specimen(s) by brushing or                         washing, when performed (separate procedure) Diagnosis Code(s):     --- Professional ---                        X91.478, Other intestinal obstruction unspecified as                         to partial versus complete obstruction                        K52.9, Noninfective gastroenteritis and colitis,                         unspecified CPT copyright 2019 American Medical Association. All rights reserved. The codes documented in this report are preliminary and upon coder review may  be revised to meet current  compliance requirements. Wyline Mood, MD Wyline Mood MD, MD 03/11/2021 9:11:55 AM This report has been signed electronically. Number of Addenda: 0 Note Initiated On: 03/11/2021 8:42 AM Total Procedure Duration: 0 hours 8 minutes 13 seconds  Estimated Blood Loss:  Estimated blood loss: none.      [facility]

## 2021-03-11 NOTE — Op Note (Signed)
[facility] Gastroenterology Patient Name: Marilyn Weber Procedure Date: 03/11/2021 8:43 AM MRN: [id number] Account #: [id number] Date of Birth: 1946/09/22 Admit Type: Outpatient Age: 75 Room: [facility] ENDO ROOM 1 Gender: Female Note Status: Finalized Procedure:             Upper GI endoscopy Indications:           Dysphagia Providers:             Wyline Mood MD, MD Referring MD:          Clinic [facility], MD (Referring MD) Medicines:             Monitored Anesthesia Care Complications:         No immediate complications. Procedure:             Pre-Anesthesia Assessment:                        - Prior to the procedure, a History and Physical was                         performed, and patient medications, allergies and                         sensitivities were reviewed. The patient's tolerance                         of previous anesthesia was reviewed.                        - The risks and benefits of the procedure and the                         sedation options and risks were discussed with the                         patient. All questions were answered and informed                         consent was obtained.                        - ASA Grade Assessment: II - A patient with mild                         systemic disease.                        After obtaining informed consent, the endoscope was                         passed under direct vision. Throughout the procedure,                         the patient's blood pressure, pulse, and oxygen                         saturations were monitored continuously. The Endoscope                         was introduced through the mouth,  and advanced to the                         third part of duodenum. The upper GI endoscopy was                         accomplished with ease. The patient tolerated the                         procedure well. Findings:      The examined duodenum was normal.      The  stomach was normal.      The cardia and gastric fundus were normal on retroflexion.      A non-obstructing Schatzki ring was found at the gastroesophageal       junction. A TTS dilator was passed through the scope. Dilation with a       15-16.5-18 mm balloon dilator was performed to 18 mm. The dilation site       was examined and showed no change.      Normal mucosa was found in the entire esophagus. Biopsies were taken       with a cold forceps for histology.      The exam was otherwise without abnormality. Impression:            - Normal examined duodenum.                        - Normal stomach.                        - Non-obstructing Schatzki ring. Dilated.                        - Normal mucosa was found in the entire esophagus.                         Biopsied.                        - The examination was otherwise normal. Recommendation:        - Await pathology results.                        - Perform a colonoscopy today. Procedure Code(s):     --- Professional ---                        [phone], Esophagogastroduodenoscopy, flexible,                         transoral; with transendoscopic balloon dilation of                         esophagus (less than 30 mm diameter)                        43239, 59, Esophagogastroduodenoscopy, flexible,                         transoral; with biopsy, single or multiple Diagnosis Code(s):     --- Professional ---  K22.2, Esophageal obstruction                        R13.10, Dysphagia, unspecified CPT copyright 2019 American Medical Association. All rights reserved. The codes documented in this report are preliminary and upon coder review may  be revised to meet current compliance requirements. Wyline Mood, MD Wyline Mood MD, MD 03/11/2021 8:59:52 AM This report has been signed electronically. Number of Addenda: 0 Note Initiated On: 03/11/2021 8:43 AM Estimated Blood Loss:  Estimated blood loss: none.      [facility]

## 2021-03-11 NOTE — Anesthesia Preprocedure Evaluation (Addendum)
Anesthesia Evaluation  Patient identified by MRN, date of birth, ID band Patient awake    Reviewed: Allergy & Precautions, H&P , NPO status , Patient's Chart, lab work & pertinent test results  History of Anesthesia Complications Negative for: history of anesthetic complications  Airway Mallampati: II  TM Distance: >3 FB     Dental  (+) Edentulous Upper   Pulmonary asthma , neg sleep apnea, neg COPD,    breath sounds clear to auscultation       Cardiovascular hypertension, (-) angina(-) Past MI and (-) Cardiac Stents (-) dysrhythmias  Rhythm:regular Rate:Normal     Neuro/Psych PSYCHIATRIC DISORDERS Schizophrenia Peripheral neuropathy negative neurological ROS     GI/Hepatic negative GI ROS, Neg liver ROS,   Endo/Other  diabetesHypothyroidism Hyperthyroidism   Renal/GU negative Renal ROS  negative genitourinary   Musculoskeletal   Abdominal   Peds  Hematology negative hematology ROS (+)   Anesthesia Other Findings Past Medical History: No date: Asthma No date: Diabetes mellitus without complication (HCC) No date: Hypertension No date: Hypothyroidism  Past Surgical History: No date: ABDOMINAL HYSTERECTOMY No date: BREAST CYST ASPIRATION; Bilateral No date: gallstones removed No date: HERNIA REPAIR No date: TUBAL LIGATION  BMI    Body Mass Index: 33.47 kg/m      Reproductive/Obstetrics negative OB ROS                            Anesthesia Physical Anesthesia Plan  ASA: III  Anesthesia Plan: General   Post-op Pain Management:    Induction:   PONV Risk Score and Plan: Propofol infusion and TIVA  Airway Management Planned: Nasal Cannula  Additional Equipment:   Intra-op Plan:   Post-operative Plan:   Informed Consent: I have reviewed the patients History and Physical, chart, labs and discussed the procedure including the risks, benefits and alternatives for the  proposed anesthesia with the patient or authorized representative who has indicated his/her understanding and acceptance.     Dental Advisory Given  Plan Discussed with: Anesthesiologist, CRNA and Surgeon  Anesthesia Plan Comments:         Anesthesia Quick Evaluation

## 2021-03-11 NOTE — H&P (Signed)
Wyline Mood, MD [address], [address], [place], [place], [address] [address], [address], [place], [place], [address] Phone: [phone]  Fax: [phone]  Primary Care Physician:  Center, [facility]   Pre-Procedure History & Physical: HPI:  Marilyn Weber is a 75 y.o. female is here for an endoscopy and colonoscopy    Past Medical History:  Diagnosis Date  . Asthma   . Diabetes mellitus without complication (HCC)   . Hypertension   . Hypothyroidism     Past Surgical History:  Procedure Laterality Date  . ABDOMINAL HYSTERECTOMY    . BREAST CYST ASPIRATION Bilateral   . gallstones removed    . HERNIA REPAIR    . TUBAL LIGATION      Prior to Admission medications   Medication Sig Start Date End Date Taking? Authorizing Provider  gabapentin (NEURONTIN) 300 MG capsule Take 300 mg by mouth 3 (three) times daily.   Yes [provider]  hydrOXYzine (ATARAX/VISTARIL) 25 MG tablet Take 25 mg by mouth 3 (three) times daily as needed.   Yes [provider]  meloxicam (MOBIC) 7.5 MG tablet Take 7.5 mg by mouth daily.   Yes [provider]  amLODipine (NORVASC) 10 MG tablet Take 1 tablet (10 mg total) by mouth daily. Patient not taking: Reported on 03/11/2021 05/05/20   Clapacs, Jackquline Denmark, MD  empagliflozin (JARDIANCE) 25 MG TABS tablet Take 25 mg by mouth daily.     [provider]  glipiZIDE (GLUCOTROL) 5 MG tablet Take 1 tablet (5 mg total) by mouth 2 (two) times daily before a meal. 05/05/20   Clapacs, Jackquline Denmark, MD  haloperidol (HALDOL) 5 MG tablet Take 1 tablet (5 mg total) by mouth 2 (two) times daily. Patient not taking: Reported on 03/11/2021 05/05/20   Clapacs, Jackquline Denmark, MD  hydrochlorothiazide (HYDRODIURIL) 25 MG tablet Take 1 tablet (25 mg total) by mouth daily. 05/05/20   Clapacs, Jackquline Denmark, MD  insulin glargine (LANTUS) 100 UNIT/ML injection Inject 0.15 mLs (15 Units total) into the skin at bedtime. 05/05/20   Clapacs, Jackquline Denmark, MD   traMADol (ULTRAM) 50 MG tablet Take 1 tablet (50 mg total) by mouth every 8 (eight) hours as needed for moderate pain. Patient not taking: Reported on 03/11/2021 05/05/20   Clapacs, Jackquline Denmark, MD    Allergies as of 02/17/2021 - Review Complete 02/16/2021  Allergen Reaction Noted  . Lisinopril Swelling 10/01/2016  . Penicillins Rash 10/01/2016    Family History  Problem Relation Age of Onset  . Lung cancer Brother   . Breast cancer Neg Hx     Social History   Socioeconomic History  . Marital status: Widowed    Spouse name: Not on file  . Number of children: Not on file  . Years of education: Not on file  . Highest education level: Not on file  Occupational History  . Not on file  Tobacco Use  . Smoking status: Never Smoker  . Smokeless tobacco: Never Used  Vaping Use  . Vaping Use: Never used  Substance and Sexual Activity  . Alcohol use: Never  . Drug use: Never  . Sexual activity: Not on file  Other Topics Concern  . Not on file  Social History Narrative  . Not on file   Social Determinants of Health   Financial Resource Strain: Not on file  Food Insecurity: Not on file  Transportation Needs: Not on file  Physical Activity: Not on file  Stress: Not on file  Social Connections: Not on file  Intimate Partner Violence: Not on file    Review of Systems: See HPI, otherwise negative ROS  Physical Exam: BP (!) 161/90   Pulse 74   Temp 98.1 F (36.7 C) (Temporal)   Resp 20   Ht 5\' 4"  (1.626 m)   Wt 88.5 kg   SpO2 100%   BMI 33.47 kg/m  General:   Alert,  pleasant and cooperative in NAD Head:  Normocephalic and atraumatic. Neck:  Supple; no masses or thyromegaly. Lungs:  Clear throughout to auscultation, normal respiratory effort.    Heart:  +S1, +S2, Regular rate and rhythm, No edema. Abdomen:  Soft, nontender and nondistended. Normal bowel sounds, without guarding, and without rebound.   Neurologic:  Alert and  oriented x4;  grossly normal  neurologically.  Impression/Plan: Marilyn Weber is here for an endoscopy and colonoscopy  to be performed for  evaluation of dysphagia and diarrhea     Risks, benefits, limitations, and alternatives regarding endoscopy have been reviewed with the patient.  Questions have been answered.  All parties agreeable.   Donne Anon, MD  03/11/2021, 8:36 AM

## 2021-03-12 ENCOUNTER — Encounter: Payer: Self-pay | Admitting: Gastroenterology

## 2021-03-12 NOTE — Anesthesia Postprocedure Evaluation (Signed)
Anesthesia Post Note  Patient: Marilyn Weber  Procedure(s) Performed: COLONOSCOPY WITH PROPOFOL (N/A ) ESOPHAGOGASTRODUODENOSCOPY (EGD) WITH PROPOFOL (N/A )  Patient location during evaluation: PACU Anesthesia Type: General Level of consciousness: awake and alert Pain management: pain level controlled Vital Signs Assessment: post-procedure vital signs reviewed and stable Respiratory status: spontaneous breathing, nonlabored ventilation and respiratory function stable Cardiovascular status: blood pressure returned to baseline and stable Postop Assessment: no apparent nausea or vomiting Anesthetic complications: no   No complications documented.   Last Vitals:  Vitals:   03/11/21 0926 03/11/21 0936  BP: (!) 150/94 (!) 151/88  Pulse: 80 74  Resp: 17 17  Temp:    SpO2: 98% 98%    Last Pain:  Vitals:   03/11/21 0936  TempSrc:   PainSc: 0-No pain                 Aurelio Brash Marilyn Weber

## 2021-03-15 LAB — SURGICAL PATHOLOGY

## 2021-03-23 ENCOUNTER — Telehealth: Payer: Self-pay

## 2021-03-23 ENCOUNTER — Other Ambulatory Visit: Payer: Self-pay

## 2021-03-23 DIAGNOSIS — R197 Diarrhea, unspecified: Secondary | ICD-10-CM

## 2021-03-23 NOTE — Telephone Encounter (Signed)
Called patient to inform her the barium enema that is scheduled for 9am May 12th. I will mail instructions to her home address. Pt verbalized understanding.

## 2021-03-23 NOTE — Progress Notes (Signed)
Will mail exam instructions to patient.

## 2021-03-23 NOTE — Telephone Encounter (Signed)
-----   Message from Wyline Mood, MD sent at 03/22/2021  8:03 AM EDT ----- Regarding: RE: scheduling barium enema She seems to have developed acute confusion recently but was fine during the procedure, I would contact her in a week to check and see how she is doing and accordingky schedule the barium enema   ----- Message ----- From: Wilnette Kales, CMA Sent: 03/19/2021   1:09 PM EDT To: Wyline Mood, MD Subject: scheduling barium enema                        Hi Dr. Doreen Beam had a note on her desk to schedule patient for a barium enema. I looked in her chart and she was seen by [facility] on 03/15/21 for rectal bleeding. From reading the nurse triage note, I really do not think patient will be able to comprehend [place] getting her schedule for the procedure. Can you look over that note and advise if we should schedule?     Thanks

## 2021-03-25 ENCOUNTER — Telehealth: Payer: Self-pay

## 2021-03-25 NOTE — Telephone Encounter (Signed)
Called patient to inform her of results. Unable to LVM to due it being full.

## 2021-03-25 NOTE — Telephone Encounter (Signed)
-----   Message from Wyline Mood, MD sent at 03/23/2021  9:51 AM EDT ----- Inform   Biopsies of the esophagus show a form of inflammation. Check if she is taking her PPI as prescribed , enquire if still has dysphagia and if yes needs an office visit to discuss treatment which would likely be an inhaler.   Dr Wyline Mood MD,MRCP [facility]) Gastroenterology/Hepatology Pager: [phone]

## 2021-03-26 ENCOUNTER — Telehealth: Payer: Self-pay

## 2021-03-26 NOTE — Telephone Encounter (Signed)
Informed patient of Dr. Johnney Killian comments/recommendations. Explained to patient I mailed the instructions for the barium enema to her home address. Pt verbalized understanding.

## 2021-03-26 NOTE — Telephone Encounter (Signed)
-----   Message from Wyline Mood, MD sent at 03/23/2021  9:50 AM EDT ----- Inform   Biopsies of the esophagus show a form of inflammation. Check if she is taking her PPI as prescribed , enquire if still has dysphagia and if yes needs an office visit to discuss treatment which would likely be an inhaler.   Dr Wyline Mood MD,MRCP [facility]) Gastroenterology/Hepatology Pager: [phone]

## 2021-04-22 ENCOUNTER — Telehealth: Payer: Self-pay

## 2021-04-22 NOTE — Telephone Encounter (Signed)
Returned patients call. Went over instructions with the patient. Pt verbalized understanding.

## 2021-04-22 NOTE — Telephone Encounter (Signed)
Patient has more questions concerning the barium enema  instructions that were recently mailed to her.

## 2021-04-27 ENCOUNTER — Telehealth: Payer: Self-pay | Admitting: Gastroenterology

## 2021-04-27 NOTE — Telephone Encounter (Signed)
Patient called wanting to cancel her procedure, I gave her Central Scheduling pone number to call to cancel her procedure.

## 2021-04-29 ENCOUNTER — Ambulatory Visit: Payer: Medicare Other

## 2021-05-31 ENCOUNTER — Encounter: Payer: Self-pay | Admitting: Gastroenterology

## 2021-05-31 ENCOUNTER — Other Ambulatory Visit: Payer: Self-pay

## 2021-05-31 ENCOUNTER — Ambulatory Visit (INDEPENDENT_AMBULATORY_CARE_PROVIDER_SITE_OTHER): Payer: Medicare Other | Admitting: Gastroenterology

## 2021-05-31 VITALS — BP 113/71 | HR 77 | Temp 98.4°F | Ht 64.0 in | Wt 193.0 lb

## 2021-05-31 DIAGNOSIS — K208 Other esophagitis without bleeding: Secondary | ICD-10-CM

## 2021-05-31 DIAGNOSIS — K56699 Other intestinal obstruction unspecified as to partial versus complete obstruction: Secondary | ICD-10-CM | POA: Diagnosis not present

## 2021-05-31 NOTE — Patient Instructions (Signed)
[facility] at 9:30 AM.

## 2021-05-31 NOTE — Progress Notes (Signed)
Wyline Mood MD, MRCP(U.K) [address]  [address]  [place], [place] [address]  Main: [phone]  Fax: [phone]   Primary Care Physician: [facility]  Primary Gastroenterologist:  Dr. Wyline Mood   Chief Complaint  Patient presents with   Diarrhea   dyspepsia    HPI: GOLDY CALANDRA is a 75 y.o. female   Summary of history :  She was initially referred and seen on 02/16/2021 for GERD and diarrhea. Lab work in November 2021: Creatinine 1.86 alkaline phosphatase 169.  No prior colonoscopy.   She has had diarrhea for many years.  As long as she takes Imodium no issues.  Last colonoscopy was over 10 years back.  Denies any blood in the stool.  She complains of difficulty swallowing.  Feels that the food gets stuck in her throat.  Has been going on for at least a few weeks.  Takes omeprazole 40 mg once a day.  Does not really take it all the time before breakfast.  Interval history 02/16/2021-05/31/2021  02/16/2021: Hemoglobin 13.9 g, CMP: Creatinine 1.08 alkaline phosphatase 134, C. difficile testing by PCR negative 02/19/2021: EGD nonobstructing Schatzki's ring at the GE junction dilated to 18 mm, biopsies of esophagus taken to rule out eosinophilic esophagitis that were suggestive of lymphocytic esophagitis .   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  Current Outpatient Medications  Medication Sig Dispense Refill   empagliflozin (JARDIANCE) 25 MG TABS tablet Take 25 mg by mouth daily.      gabapentin (NEURONTIN) 300 MG capsule Take 300 mg by mouth 3 (three) times daily.     glipiZIDE (GLUCOTROL) 5 MG tablet Take 1 tablet (5 mg total) by mouth 2 (two) times daily before a meal. 60 tablet 1   haloperidol (HALDOL) 5 MG tablet Take 1 tablet (5  mg total) by mouth 2 (two) times daily. 60 tablet 1   hydrochlorothiazide (HYDRODIURIL) 25 MG tablet Take 1 tablet (25 mg total) by mouth daily. 30 tablet 1   hydrOXYzine (ATARAX/VISTARIL) 25 MG tablet Take 25 mg by mouth 3 (three) times daily as needed.     insulin glargine (LANTUS) 100 UNIT/ML injection Inject 0.15 mLs (15 Units total) into the skin at bedtime. 10 mL 1   meloxicam (MOBIC) 7.5 MG tablet Take 7.5 mg by mouth daily.     propranolol (INDERAL) 20 MG tablet Take 20 mg by mouth every 6 (six) hours as needed.     UNITHROID 75 MCG tablet Take 75 mcg by mouth daily.     No current facility-administered medications for this visit.    Allergies as of 05/31/2021 - Review Complete 05/31/2021  Allergen Reaction Noted   Lisinopril Swelling 10/01/2016   Penicillins Rash 10/01/2016    ROS:  General: Negative for anorexia, weight loss, fever, chills, fatigue, weakness. ENT: Negative for hoarseness, difficulty swallowing , nasal congestion. CV: Negative for chest pain, angina, palpitations, dyspnea on exertion, peripheral edema.  Respiratory: Negative for dyspnea at rest, dyspnea on exertion, cough, sputum, wheezing.  GI: See history of present illness. GU:  Negative for dysuria, hematuria, urinary incontinence, urinary frequency, nocturnal urination.  Endo: Negative for unusual weight change.    Physical Examination:   BP 113/71   Pulse 77  Temp 98.4 F (36.9 C) (Oral)   Ht 5\' 4"  (1.626 m)   Wt 193 lb (87.5 kg)   BMI 33.13 kg/m   General: Well-nourished, well-developed in no acute distress.  Eyes: No icterus. Conjunctivae pink. Neuro: Alert and oriented x 3.  Grossly intact. Skin: Warm and dry, no jaundice.   Psych: Alert and cooperative, normal mood and affect.   Imaging Studies: No results found.  Assessment and Plan:   ARZELLA REHMANN is a 75 y.o. y/o female here to follow-up for dysphagia, GERD as well as chronic diarrhea .     Plan 1.  Obtain barium enema to  evaluate stenosis of the distal transverse colon which prevented 61 to complete the colonoscopy.  If barium enema is not conclusive will need CT colonography study  2.  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  Instructions have been provided.   Dr [place]  MD,MRCP [facility]) Follow up in 2 to 3 weeks to discuss results of the barium enema and next steps

## 2021-06-01 ENCOUNTER — Telehealth: Payer: Self-pay

## 2021-06-01 MED ORDER — PEG 3350-KCL-NA BICARB-NACL 420 G PO SOLR: ORAL | 0 refills | Status: DC

## 2021-06-01 NOTE — Telephone Encounter (Signed)
Patient called stating that she wanted to know what prep she needed to pick up. I told her that the radiology department stated that she would have to eat clear liquids after 5 PM if she gets hungry. At that time she will have to start taking Golytely until she finishes it. Patient also understood that she would have to NPO after midnight the night before. Patient had no further questions.

## 2021-06-02 ENCOUNTER — Other Ambulatory Visit: Payer: Self-pay | Admitting: Gastroenterology

## 2021-06-02 ENCOUNTER — Other Ambulatory Visit: Payer: Self-pay

## 2021-06-02 ENCOUNTER — Ambulatory Visit
Admission: RE | Admit: 2021-06-02 | Discharge: 2021-06-02 | Disposition: A | Discharge status: Home / Self Care | Payer: Medicare Other | Source: Ambulatory Visit | Attending: Gastroenterology | Admitting: Gastroenterology

## 2021-06-02 DIAGNOSIS — R197 Diarrhea, unspecified: Secondary | ICD-10-CM

## 2021-06-02 IMAGING — RF DG BE SINGLE CONTRAST
15 of 23 series · 15 of 24 positions shown · non-contrast
Comparison: None.

CLINICAL DATA: Chronic intermittent diarrhea.

EXAM:
SINGLE CONTRAST BARIUM ENEMA
TECHNIQUE: Initial scout AP supine abdominal image obtained to insure adequate
colon cleansing. Barium was introduced into the colon in a
retrograde fashion and refluxed from the rectum to the cecum. Spot
images of the colon followed by overhead radiographs were obtained.
FLUOROSCOPY TIME:  Fluoroscopy Time:  4 minutes and 24 seconds.
Radiation Exposure Index (if provided by the fluoroscopic device):
104 mGy
Number of Acquired Spot Images:

[Series 1: t abdomen supine · 0.14mm/px · 1 of 1 slices shown]
[im 1/1]
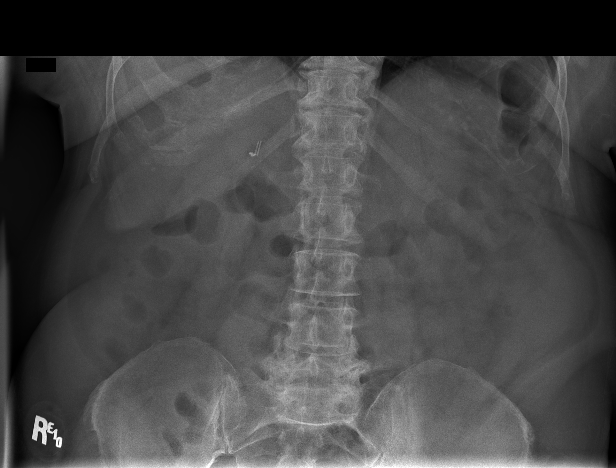

[Series 3: fluoro_barium swallow 2fps_bw · 0.18mm/px · 1 of 2 frames shown (1 of 14)]
[frame 1/2]
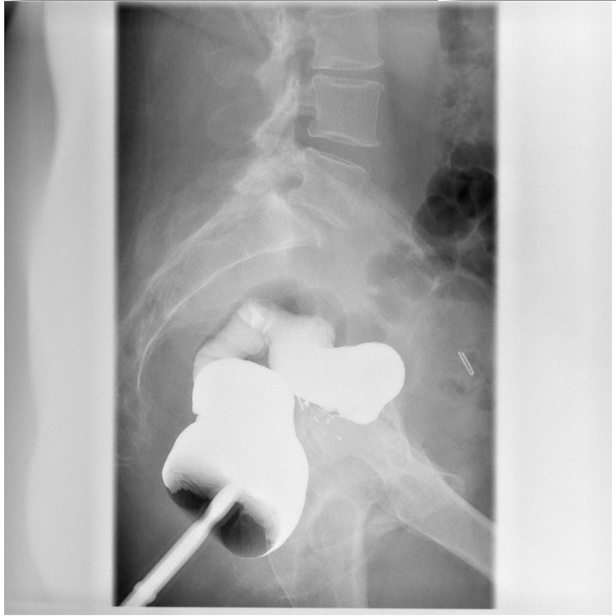

[Series 4: fluoro_barium swallow 2fps_bw · 0.18mm/px · 1 of 1 slices shown (2 of 14)]
[im 1/1]
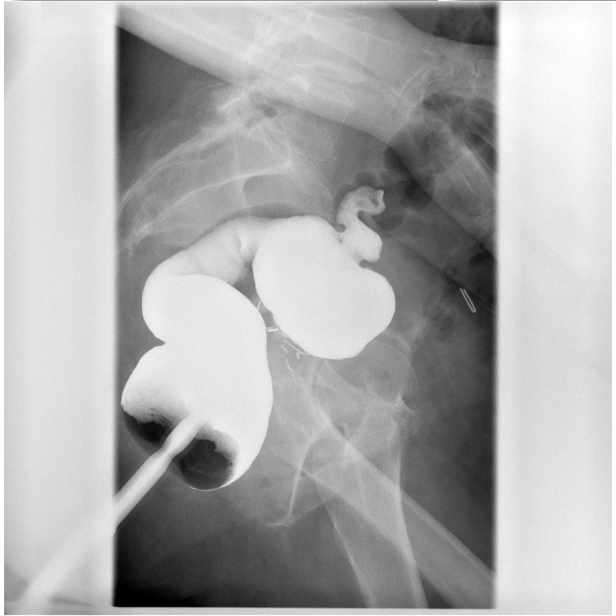

[Series 5: fluoro_barium swallow 2fps_bw · 0.18mm/px · 1 of 1 slices shown (3 of 14)]
[im 1/1]
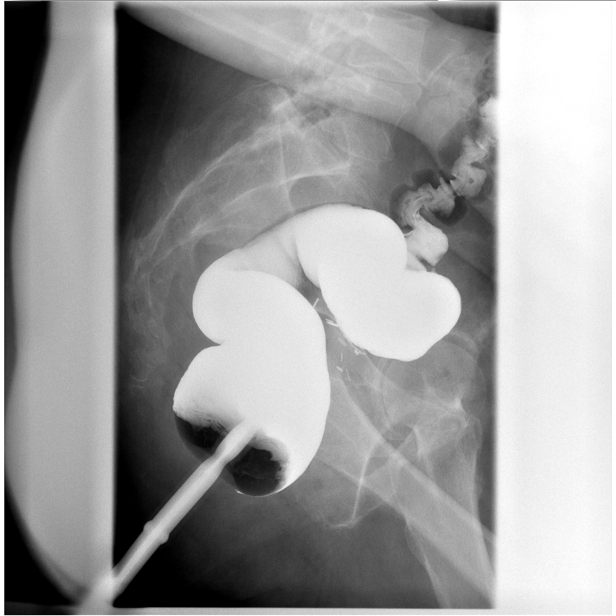

[Series 7: fluoro_barium swallow 2fps_bw · 0.18mm/px · 1 of 1 slices shown (4 of 14)]
[im 1/1]
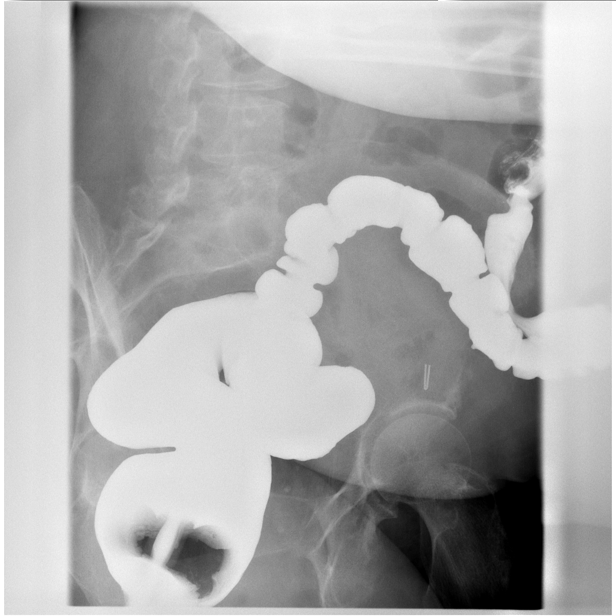

[Series 8: fluoro_barium swallow 2fps_bw · 0.18mm/px · 1 of 1 slices shown (5 of 14)]
[im 1/1]
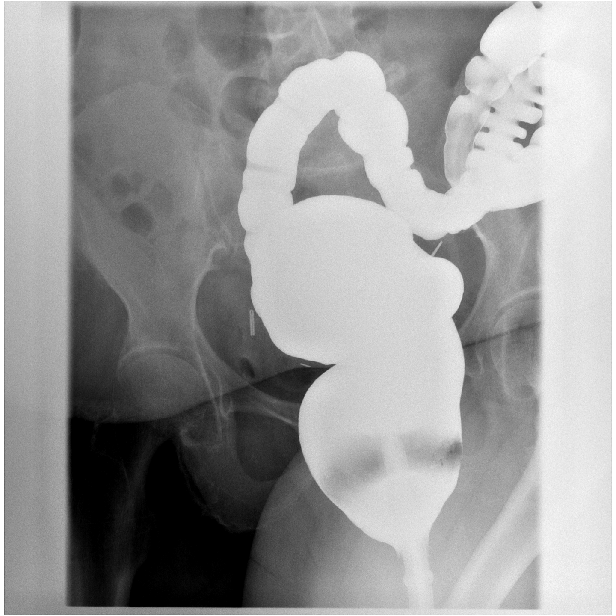

[Series 10: fluoro_barium swallow 2fps_bw · 0.18mm/px · 1 of 1 slices shown (6 of 14)]
[im 1/1]
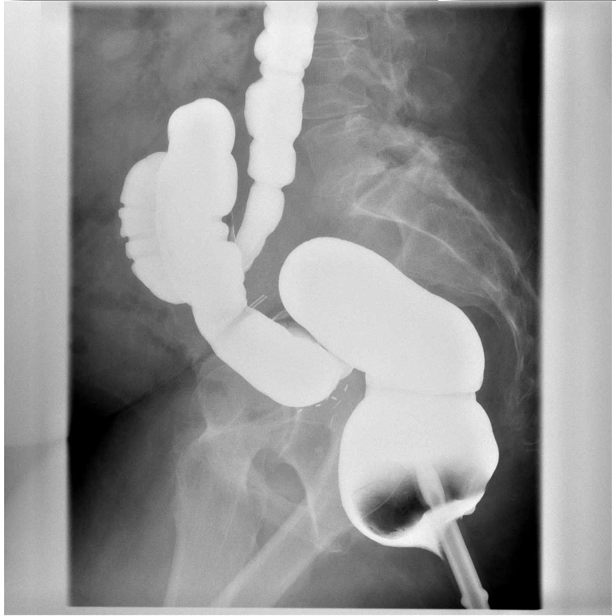

[Series 13: fluoro_barium swallow 2fps_bw · 0.18mm/px · 1 of 1 slices shown (7 of 14)]
[im 1/1]
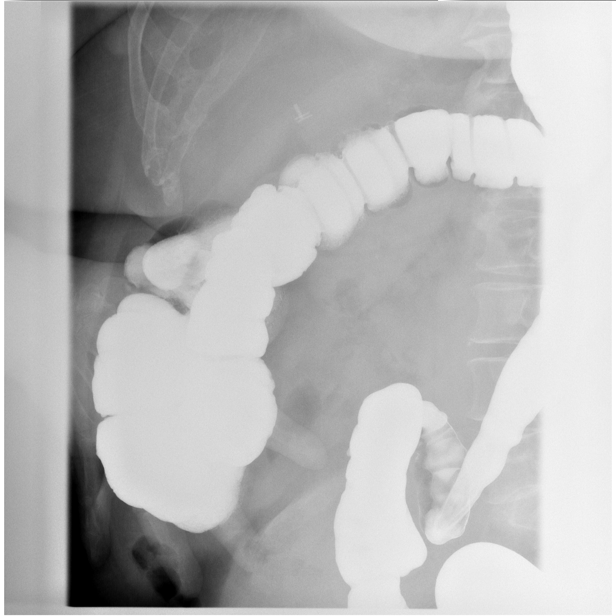

[Series 14: fluoro_barium swallow 2fps_bw · 0.18mm/px · 1 of 1 slices shown (8 of 14)]
[im 1/1]
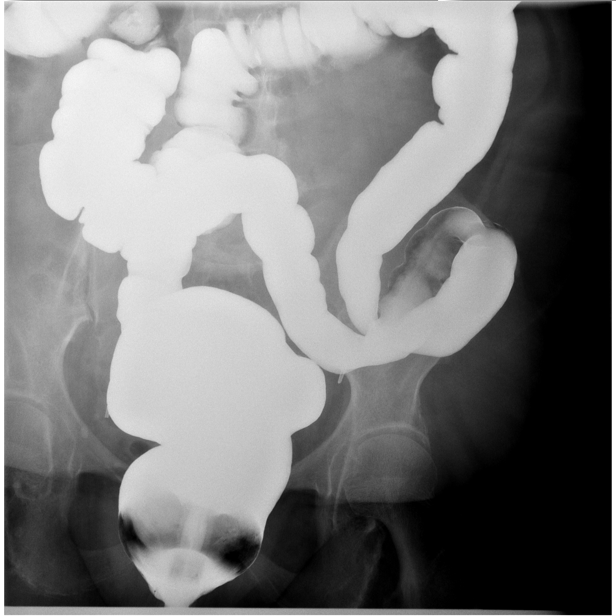

[Series 16: fluoro_barium swallow 2fps_bw · 0.18mm/px · 1 of 1 slices shown (9 of 14)]
[im 1/1]
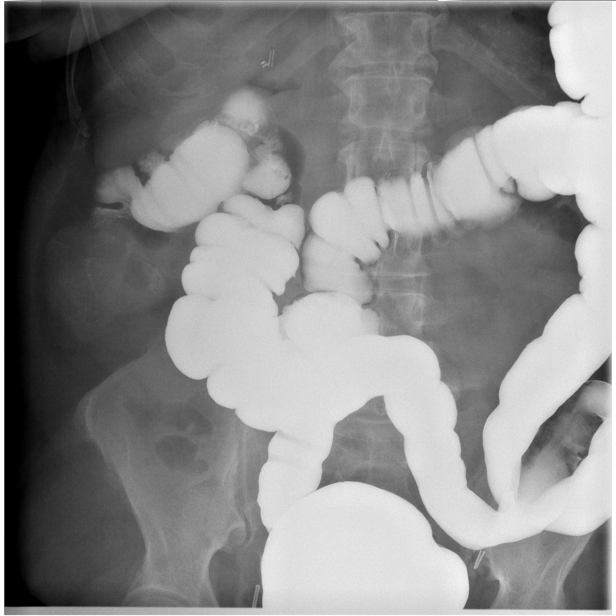

[Series 17: fluoro_barium swallow 2fps_bw · 0.18mm/px · 1 of 1 slices shown (10 of 14)]
[im 1/1]
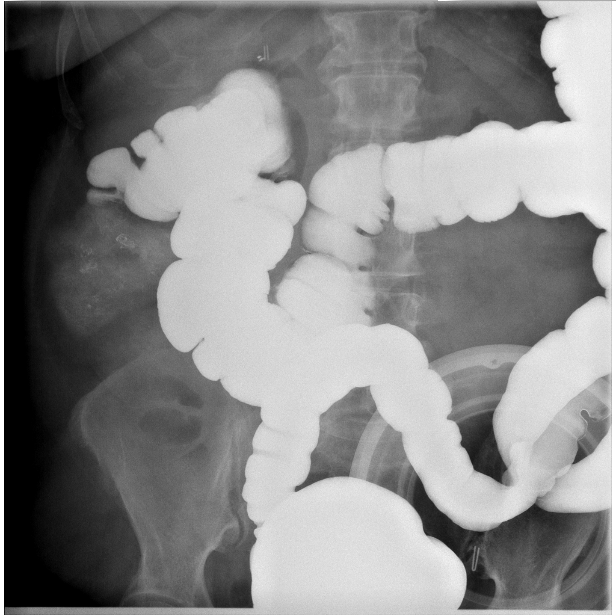

[Series 19: fluoro_barium swallow 2fps_bw · 0.18mm/px · 1 of 1 slices shown (11 of 14)]
[im 1/1]
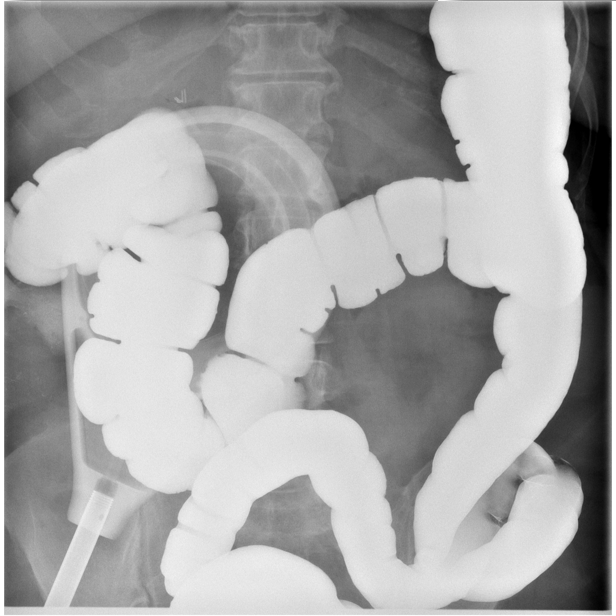

[Series 21: fluoro_barium swallow 2fps_bw · 0.18mm/px · 1 of 1 slices shown (12 of 14)]
[im 1/1]
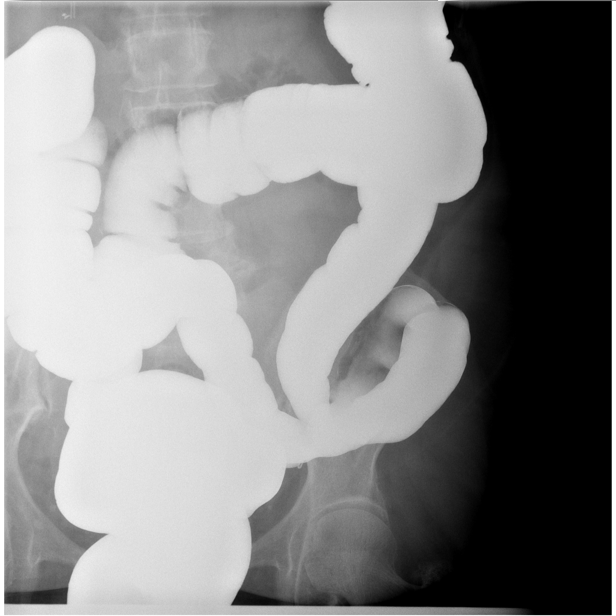

[Series 22: fluoro_barium swallow 2fps_bw · 0.18mm/px · 1 of 1 slices shown (13 of 14)]
[im 1/1]
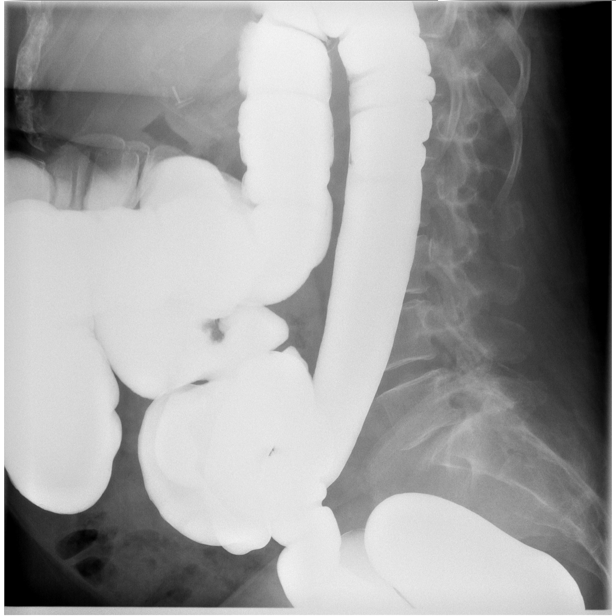

[Series 24: fluoro_barium swallow 2fps_bw · 0.18mm/px · 1 of 1 slices shown (14 of 14)]
[im 1/1]
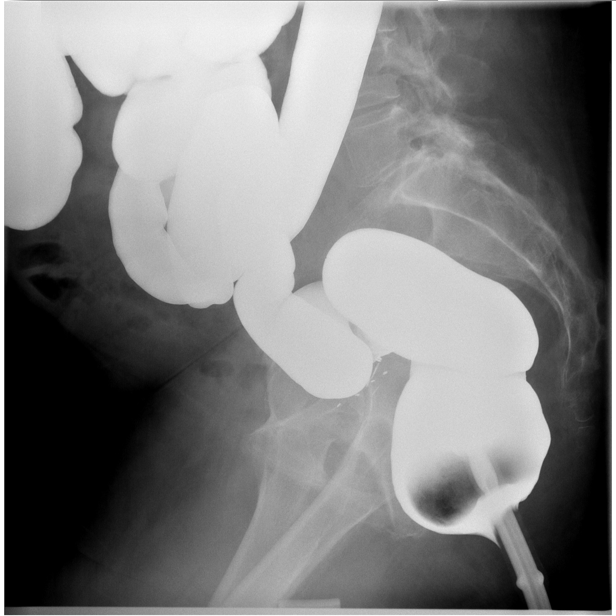

[15 of 24 positions shown; findings below may reference images not displayed]

FINDINGS: Pre-procedure KUB shows a a normal bowel gas pattern. Numerous
surgical clips are seen in the pelvis. Surgical clips in the right
upper quadrant suggest prior cholecystectomy.

Fter a barium enema tip was placed in the rectum, barium contrast
material was introduced into the colon in a retrograde fashion.
Appendix was opacified. No evidence for clinically significant
colonic polyp or mass. No features to suggest colonic stricture. No
evidence for diverticular disease.
IMPRESSION: Unremarkable single contrast barium enema without evidence for
clinically significant colonic polyp or mass.

## 2021-06-08 ENCOUNTER — Telehealth: Payer: Self-pay | Admitting: Gastroenterology

## 2021-06-08 DIAGNOSIS — Z1211 Encounter for screening for malignant neoplasm of colon: Secondary | ICD-10-CM

## 2021-06-08 NOTE — Telephone Encounter (Signed)
Patient called and says she is sick and has been sick since her procedure. Please call to advise

## 2021-06-09 ENCOUNTER — Telehealth: Payer: Self-pay

## 2021-06-09 NOTE — Telephone Encounter (Signed)
Called patient back and she did not answer. However, I was not able to leave her a voicemail since her mailbox is full.  Per Dr. Tobi Bastos: Wyline Mood, MD  Adela Ports, CMA Inform barium enema showed no abnormality : suggest CT colonography to evaluate entire colon for any large polyps as colonoscopy was incomplete and noted tight stricture.

## 2021-06-09 NOTE — Telephone Encounter (Signed)
Patient's CT Colonoscopy order was faxed to [facility] per Dr. Johnney Killian request.

## 2021-06-09 NOTE — Telephone Encounter (Signed)
Patient called second time, waiting on call back

## 2021-06-11 NOTE — Telephone Encounter (Signed)
Called patient again but was not able to leave her a voicemail since the mailbox is full. However, I faxed the order to [facility] so they could contact the patient to schedule her an appointment for her CT Colonoscopy.

## 2021-06-14 ENCOUNTER — Telehealth: Payer: Self-pay

## 2021-06-14 NOTE — Telephone Encounter (Signed)
Patient states she has not been able to eat much and has been nausea. She wants to know if she needs a colonoscopy or what she needs

## 2021-06-15 NOTE — Telephone Encounter (Signed)
Called [facility] to ask if they have called the patient to schedule her a CT Colonoscopy. They stated that they have called the patient and that her voicemail was full. I told them that I would call the patient to notify her. I then tried to call the patient and was not able to speak with her nor leave her a voicemail. I then called her sister Sallye Ober and asked her to give her the message to call [facility] to schedule her CT Colonoscopy so Dr. Tobi Bastos could evaluate the results and then give her advise in reference to what to do.

## 2021-06-16 ENCOUNTER — Other Ambulatory Visit: Payer: Self-pay | Admitting: Family Medicine

## 2021-06-16 DIAGNOSIS — Z1231 Encounter for screening mammogram for malignant neoplasm of breast: Secondary | ICD-10-CM

## 2021-07-01 ENCOUNTER — Ambulatory Visit: Payer: Medicare Other | Admitting: Gastroenterology

## 2021-07-01 NOTE — Progress Notes (Deleted)
Marilyn Mood MD, MRCP(U.K) [address]  [address]  [place], [place] [address]  Main: [phone]  Fax: [phone]   Primary Care Physician: Center, [facility]  Primary Gastroenterologist:  Dr. Wyline Weber   No chief complaint on file.   HPI: Marilyn Weber is a 75 y.o. female  Summary of history :   She was initially referred and seen on 02/16/2021 for GERD and diarrhea. Lab work in November 2021: Creatinine 1.86 alkaline phosphatase 169.  No prior colonoscopy.   She has had diarrhea for many years.  As long as she takes Imodium no issues.  Last colonoscopy was over 10 years back.  Denies any blood in the stool.  She complains of difficulty swallowing.  Feels that the food gets stuck in her throat.  Has been going on for at least a few weeks.  Takes omeprazole 40 mg once a day.  Does not really take it all the time before breakfast.  02/16/2021: Hemoglobin 13.9 g, CMP: Creatinine 1.08 alkaline phosphatase 134, C. difficile testing by PCR negative 02/19/2021: EGD nonobstructing Schatzki's ring at the GE junction dilated to 18 mm, biopsies of esophagus taken to rule out eosinophilic esophagitis that were suggestive of lymphocytic esophagitis . She also underwent a colonoscopy at the same timefor chronic diarrhea and I noted severe stenosis at the splenic flexure which could not be transversed    Interval history 05/31/2021-07/01/2021  06/02/2021: Barium enema- no stricture seen .         Barium enema ordered but not obtained   Since her last visit she continues to have issues with swallowing.    Current Outpatient Medications  Medication Sig Dispense Refill   empagliflozin (JARDIANCE) 25 MG TABS tablet Take 25 mg by mouth daily.      gabapentin (NEURONTIN) 300 MG capsule Take 300 mg by mouth 3 (three) times daily.     glipiZIDE (GLUCOTROL) 5 MG tablet Take 1 tablet (5 mg total) by mouth 2 (two) times daily before a meal. 60 tablet 1   haloperidol (HALDOL) 5 MG  tablet Take 1 tablet (5 mg total) by mouth 2 (two) times daily. 60 tablet 1   hydrochlorothiazide (HYDRODIURIL) 25 MG tablet Take 1 tablet (25 mg total) by mouth daily. 30 tablet 1   hydrOXYzine (ATARAX/VISTARIL) 25 MG tablet Take 25 mg by mouth 3 (three) times daily as needed.     insulin glargine (LANTUS) 100 UNIT/ML injection Inject 0.15 mLs (15 Units total) into the skin at bedtime. 10 mL 1   meloxicam (MOBIC) 7.5 MG tablet Take 7.5 mg by mouth daily.     polyethylene glycol-electrolytes (NULYTELY) 420 g solution Starting at 5:00 PM: Drink one 8 oz glass of mixture every 15 minutes until you finish the jug.Please do not eat or drink after midnight the night before. 4000 mL 0   propranolol (INDERAL) 20 MG tablet Take 20 mg by mouth every 6 (six) hours as needed.     UNITHROID 75 MCG tablet Take 75 mcg by mouth daily.     No current facility-administered medications for this visit.    Allergies as of 07/01/2021 - Review Complete 05/31/2021  Allergen Reaction Noted   Lisinopril Swelling 10/01/2016   Penicillins Rash 10/01/2016    ROS:  General: Negative for anorexia, weight loss, fever, chills, fatigue, weakness. ENT: Negative for hoarseness, difficulty swallowing , nasal congestion. CV: Negative for chest pain, angina, palpitations, dyspnea on exertion, peripheral edema.  Respiratory: Negative for dyspnea  at rest, dyspnea on exertion, cough, sputum, wheezing.  GI: See history of present illness. GU:  Negative for dysuria, hematuria, urinary incontinence, urinary frequency, nocturnal urination.  Endo: Negative for unusual weight change.    Physical Examination:   There were no vitals taken for this visit.  General: Well-nourished, well-developed in no acute distress.  Eyes: No icterus. Conjunctivae pink. Mouth: Oropharyngeal mucosa moist and pink , no lesions erythema or exudate. Lungs: Clear to auscultation bilaterally. Non-labored. Heart: Regular rate and rhythm, no murmurs  rubs or gallops.  Abdomen: Bowel sounds are normal, nontender, nondistended, no hepatosplenomegaly or masses, no abdominal bruits or hernia , no rebound or guarding.   Extremities: No lower extremity edema. No clubbing or deformities. Neuro: Alert and oriented x 3.  Grossly intact. Skin: Warm and dry, no jaundice.   Psych: Alert and cooperative, normal Weber and affect.   Imaging Studies: DG BE (COLON)W SINGLE CM (SOL OR THIN BA)  Result Date: 06/02/2021 CLINICAL DATA:  Chronic intermittent diarrhea. EXAM: SINGLE CONTRAST BARIUM ENEMA TECHNIQUE: Initial scout AP supine abdominal image obtained to insure adequate colon cleansing. Barium was introduced into the colon in a retrograde fashion and refluxed from the rectum to the cecum. Spot images of the colon followed by overhead radiographs were obtained. FLUOROSCOPY TIME:  Fluoroscopy Time:  4 minutes and 24 seconds. Radiation Exposure Index (if provided by the fluoroscopic device): 104 mGy Number of Acquired Spot Images: COMPARISON:  None. FINDINGS: Pre-procedure KUB shows a a normal bowel gas pattern. Numerous surgical clips are seen in the pelvis. Surgical clips in the right upper quadrant suggest prior cholecystectomy. Fter a barium enema tip was placed in the rectum, barium contrast material was introduced into the colon in a retrograde fashion. Appendix was opacified. No evidence for clinically significant colonic polyp or mass. No features to suggest colonic stricture. No evidence for diverticular disease. IMPRESSION: Unremarkable single contrast barium enema without evidence for clinically significant colonic polyp or mass. Electronically Signed   By: Kennith Center M.D.   On: 06/02/2021 11:50    Assessment and Plan:   Marilyn Weber is a 75 y.o. y/o femalehere to follow-up for dysphagia, GERD as well as chronic diarrhea .      Plan 1.  Obtain barium enema to evaluate stenosis of the distal transverse colon which prevented [place] to complete the  colonoscopy.  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  Instructions have been provided.  Dr Marilyn Mood  MD,MRCP [facility]) Follow up in ***

## 2021-08-04 ENCOUNTER — Other Ambulatory Visit: Payer: Self-pay | Admitting: Family Medicine

## 2021-08-04 DIAGNOSIS — Z13820 Encounter for screening for osteoporosis: Secondary | ICD-10-CM

## 2021-08-16 ENCOUNTER — Other Ambulatory Visit: Payer: Medicare Other

## 2021-08-31 ENCOUNTER — Ambulatory Visit
Admission: RE | Admit: 2021-08-31 | Discharge: 2021-08-31 | Disposition: A | Discharge status: Home / Self Care | Payer: Medicare Other | Source: Ambulatory Visit | Attending: Family Medicine | Admitting: Family Medicine

## 2021-08-31 ENCOUNTER — Other Ambulatory Visit: Payer: Self-pay

## 2021-08-31 DIAGNOSIS — Z1231 Encounter for screening mammogram for malignant neoplasm of breast: Secondary | ICD-10-CM | POA: Insufficient documentation

## 2021-08-31 DIAGNOSIS — Z13820 Encounter for screening for osteoporosis: Secondary | ICD-10-CM | POA: Diagnosis present

## 2021-08-31 DIAGNOSIS — E119 Type 2 diabetes mellitus without complications: Secondary | ICD-10-CM | POA: Insufficient documentation

## 2021-08-31 DIAGNOSIS — Z780 Asymptomatic menopausal state: Secondary | ICD-10-CM | POA: Insufficient documentation

## 2021-08-31 IMAGING — MG MM DIGITAL SCREENING BILAT W/ TOMO AND CAD
6 of 10 series · 6 of 30 positions shown · non-contrast
Comparison: Previous exam(s).

CLINICAL DATA: Screening.

EXAM:
DIGITAL SCREENING BILATERAL MAMMOGRAM WITH TOMOSYNTHESIS AND CAD
TECHNIQUE: Bilateral screening digital craniocaudal and mediolateral oblique
mammograms were obtained. Bilateral screening digital breast
tomosynthesis was performed. The images were evaluated with
computer-aided detection.

[R MLO synth-2D]
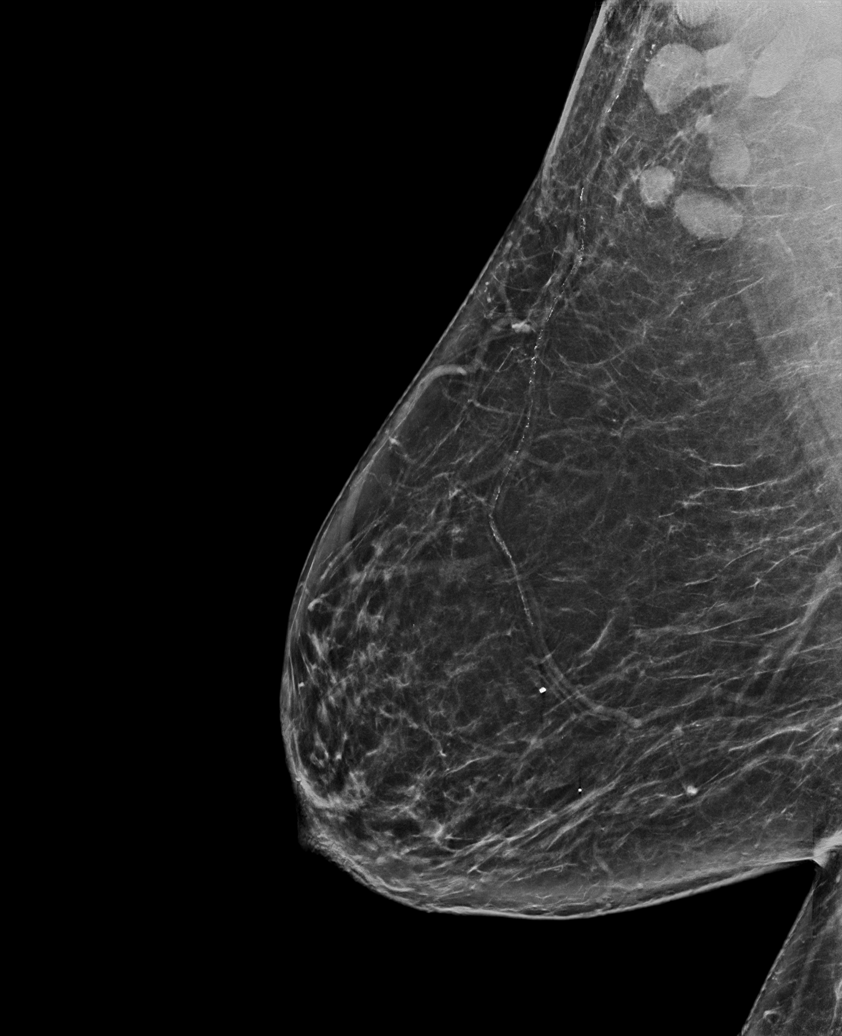

[L CC synth-2D]
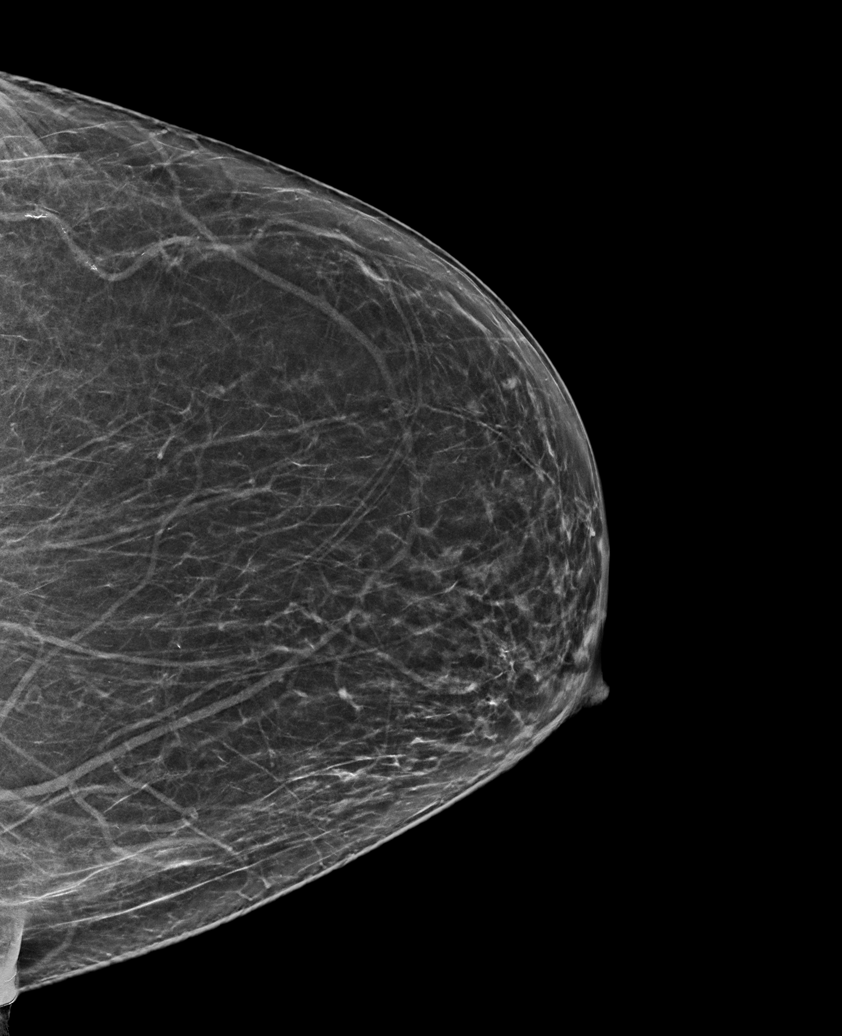

[R CC synth-2D]
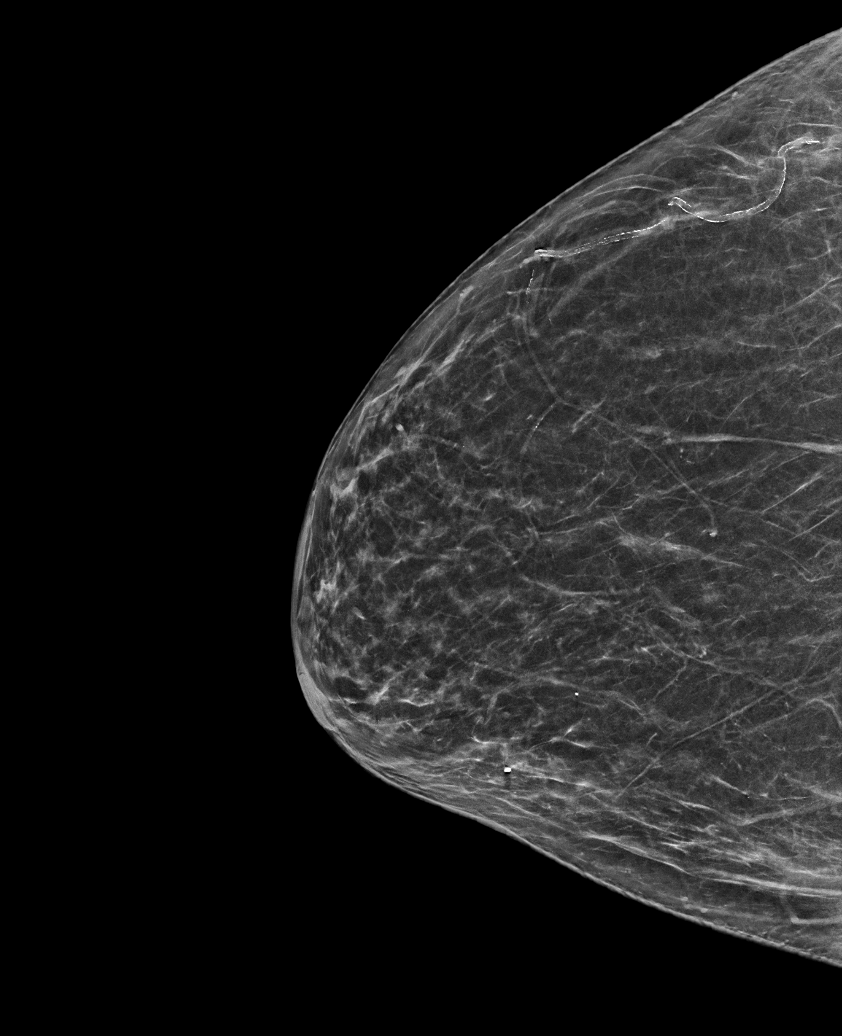

[L MLO synth-2D (1 of 2)]
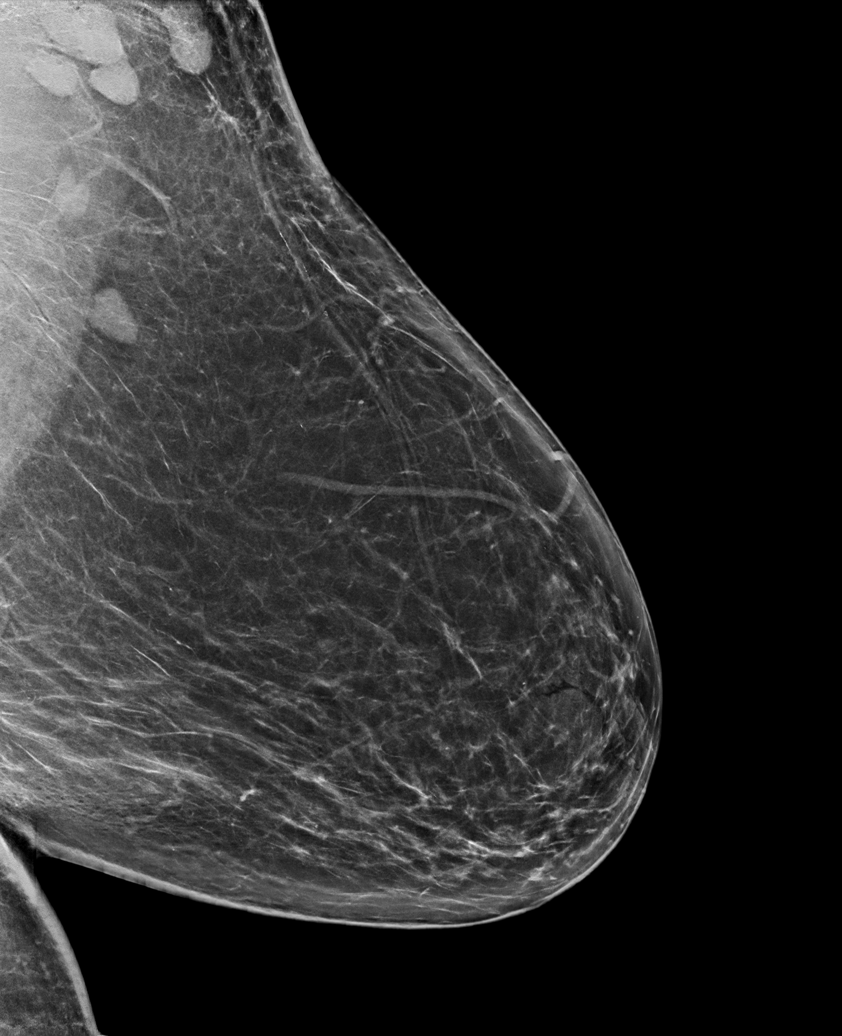

[L MLO synth-2D (2 of 2)]
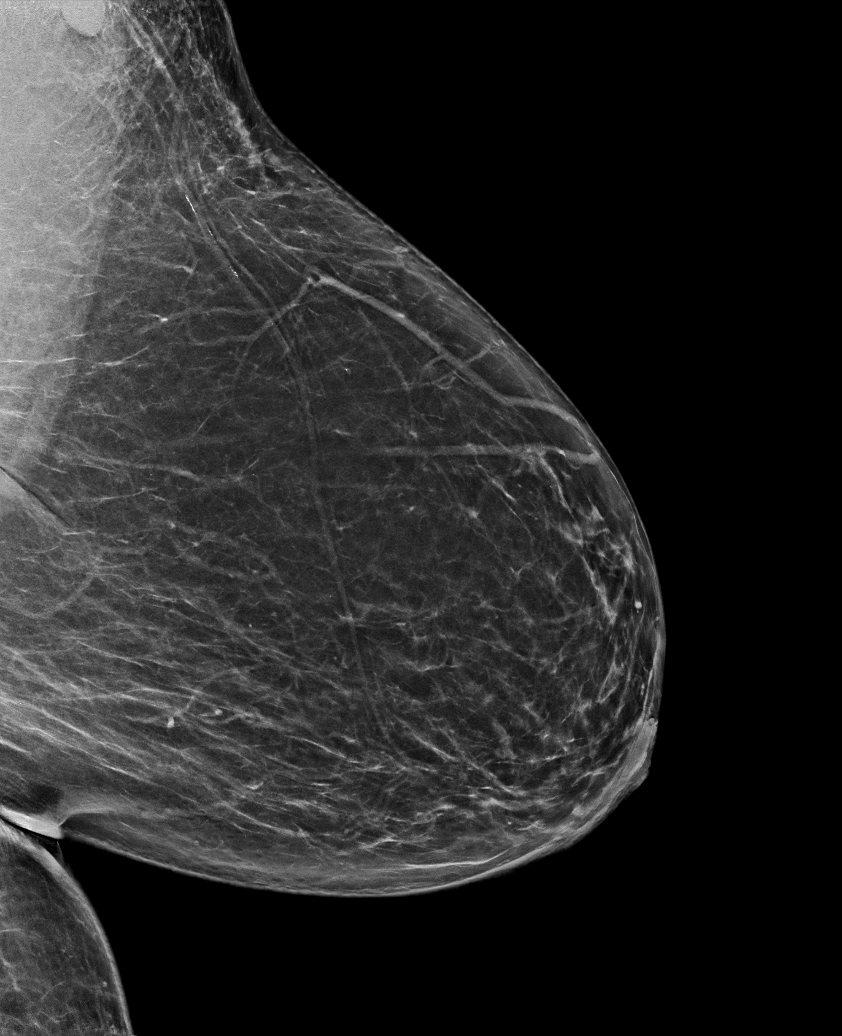

[R MLO tomo · tomo slice 39/76.0]
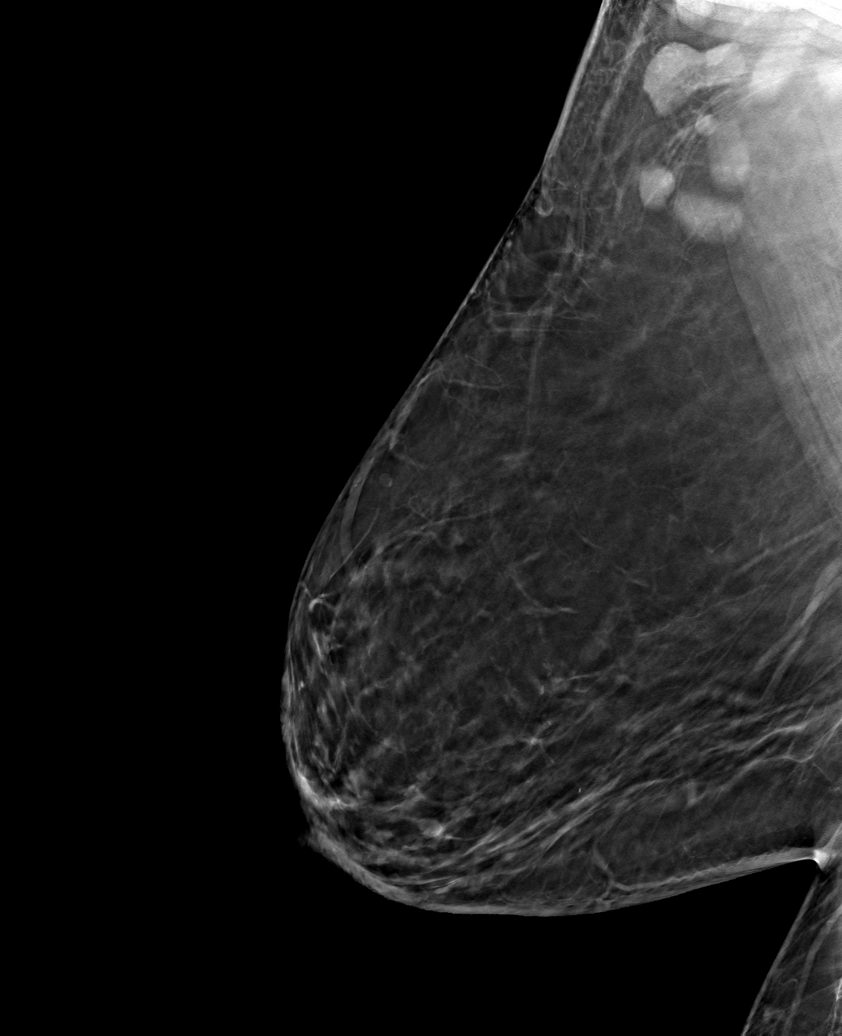

[6 of 30 positions shown; findings below may reference images not displayed]

ACR Breast Density Category b: There are scattered areas of
fibroglandular density.
FINDINGS: Bilateral prominent axillary lymph nodes.

No mammographic evidence of malignancy in either breast.
IMPRESSION: Further evaluation is suggested for possible prominent bilateral
axillary lymph nodes.

RECOMMENDATION:
Bilateral axillary ultrasound.

The patient will be contacted regarding the findings, and additional
imaging will be scheduled.

BI-RADS CATEGORY  0: Incomplete. Need additional imaging evaluation
and/or prior mammograms for comparison.

## 2021-09-07 ENCOUNTER — Ambulatory Visit (INDEPENDENT_AMBULATORY_CARE_PROVIDER_SITE_OTHER): Payer: Medicare Other | Admitting: Gastroenterology

## 2021-09-07 ENCOUNTER — Other Ambulatory Visit: Payer: Self-pay

## 2021-09-07 ENCOUNTER — Encounter: Payer: Self-pay | Admitting: Gastroenterology

## 2021-09-07 VITALS — BP 120/78 | HR 78 | Temp 99.1°F | Ht 64.0 in | Wt 195.5 lb

## 2021-09-07 DIAGNOSIS — K208 Other esophagitis without bleeding: Secondary | ICD-10-CM

## 2021-09-07 DIAGNOSIS — Z1211 Encounter for screening for malignant neoplasm of colon: Secondary | ICD-10-CM

## 2021-09-07 NOTE — Patient Instructions (Addendum)
Please call [phone] to schedule your CT colonoscopy.

## 2021-09-07 NOTE — Progress Notes (Signed)
Marilyn Mood MD, MRCP(U.K) [address]  [address]  [place], [place] [address]  Main: [phone]  Fax: [phone]   Primary Care Physician: Center, [facility]  Primary Gastroenterologist:  Dr. Wyline Weber   Chief complaint: Follow-up for chronic diarrhea and lymphocytic esophagitis   HPI: Marilyn Weber is a 75 y.o. female  Summary of history :   She was initially referred and seen on 02/16/2021 for GERD and diarrhea. Lab work in November 2021: Creatinine 1.86 alkaline phosphatase 169.  No prior colonoscopy.   She has had diarrhea for many years.  As long as she takes Imodium no issues.  Last colonoscopy was over 10 years back.  Denies any blood in the stool.  She complains of difficulty swallowing.  Feels that the food gets stuck in her throat.  Has been going on for at least a few weeks.  Takes omeprazole 40 mg once a day.  Does not really take it all the time before breakfast. 02/16/2021: Hemoglobin 13.9 g, CMP: Creatinine 1.08 alkaline phosphatase 134, C. difficile testing by PCR negative 02/19/2021: EGD nonobstructing Schatzki's ring at the GE junction dilated to 18 mm, biopsies of esophagus taken to rule out eosinophilic esophagitis that were suggestive of lymphocytic esophagitis .    She also underwent a colonoscopy at the same timefor chronic diarrhea and I noted severe stenosis at the splenic flexure which could not be transversed    Interval history 05/31/2021-09/07/2021  06/02/2021 no evidence of colonic mass or polyps.  Suggested to obtain a CT enterography since colonoscopy was incomplete.   Since her last visit no further issues with swallowing.  No real episodes of diarrhea.  Doing well otherwise. Current Outpatient Medications  Medication Sig Dispense Refill   empagliflozin (JARDIANCE) 25 MG TABS tablet Take 25 mg by mouth daily.      gabapentin (NEURONTIN) 300 MG capsule Take 300 mg by mouth 3 (three) times daily.     glipiZIDE (GLUCOTROL) 5 MG tablet  Take 1 tablet (5 mg total) by mouth 2 (two) times daily before a meal. 60 tablet 1   haloperidol (HALDOL) 5 MG tablet Take 1 tablet (5 mg total) by mouth 2 (two) times daily. 60 tablet 1   hydrochlorothiazide (HYDRODIURIL) 25 MG tablet Take 1 tablet (25 mg total) by mouth daily. 30 tablet 1   hydrOXYzine (ATARAX/VISTARIL) 25 MG tablet Take 25 mg by mouth 3 (three) times daily as needed.     insulin glargine (LANTUS) 100 UNIT/ML injection Inject 0.15 mLs (15 Units total) into the skin at bedtime. 10 mL 1   meloxicam (MOBIC) 7.5 MG tablet Take 7.5 mg by mouth daily.     propranolol (INDERAL) 20 MG tablet Take 20 mg by mouth every 6 (six) hours as needed.     UNITHROID 75 MCG tablet Take 75 mcg by mouth daily.     No current facility-administered medications for this visit.    Allergies as of 09/07/2021 - Review Complete 09/07/2021  Allergen Reaction Noted   Lisinopril Swelling 10/01/2016   Penicillins Rash 10/01/2016    ROS:  General: Negative for anorexia, weight loss, fever, chills, fatigue, weakness. ENT: Negative for hoarseness, difficulty swallowing , nasal congestion. CV: Negative for chest pain, angina, palpitations, dyspnea on exertion, peripheral edema.  Respiratory: Negative for dyspnea at rest, dyspnea on exertion, cough, sputum, wheezing.  GI: See history of present illness. GU:  Negative for dysuria, hematuria, urinary incontinence, urinary frequency, nocturnal urination.  Endo: Negative for  unusual weight change.    Physical Examination:   BP 120/78 (BP Location: Left Arm, Patient Position: Sitting, Cuff Size: Normal)   Pulse 78   Temp 99.1 F (37.3 C) (Oral)   Ht 5\' 4"  (1.626 m)   Wt 195 lb 8 oz (88.7 kg)   BMI 33.56 kg/m   General: Well-nourished, well-developed in no acute distress.  Eyes: No icterus. Conjunctivae pink. Mouth: Oropharyngeal mucosa moist and pink , no lesions erythema or exudate. Neuro: Alert and oriented x 3.  Grossly intact. Skin: Warm and  dry, no jaundice.   Psych: Alert and cooperative, normal Weber and affect.   Imaging Studies: DG BONE DENSITY (DXA)  Result Date: 08/31/2021 EXAM: DUAL X-RAY ABSORPTIOMETRY (DXA) FOR BONE MINERAL DENSITY IMPRESSION: Your patient Marilyn Weber completed a BMD test on 08/31/2021 using the 09/02/2021 iDXA DXA System (software version: 14.10) manufactured by [employer]. The following summarizes the results of our evaluation. Technologist: [facility] PATIENT BIOGRAPHICAL: Name: Marilyn Weber, Marilyn Weber Patient ID: Donne Anon Birth Date: [birth year] Height: 62.0 in. Gender: Female Exam Date: 08/31/2021 Weight: 198.4 lbs. Indications: Diabetic, gout, Hysterectomy, Oophorectomy Bilateral, Postmenopausal Fractures: Treatments: Glipizide, Insulin DENSITOMETRY RESULTS: Site      Region     Measured Date Measured Age WHO Classification Young Adult T-score BMD         %Change vs. Previous Significant Change (*) AP Spine L1-L4 10/31/2023 73.2 Normal -0.5 1.128 g/cm2 -9.8% Yes AP Spine L1-L4 03/06/2017 68.7 Normal 0.5 1.251 g/cm2 - - DualFemur Neck Left 08/31/2021 73.2 Normal -0.8 0.921 g/cm2 -16.6% Yes DualFemur Neck Left 03/06/2017 68.7 Normal 0.5 1.104 g/cm2 - - DualFemur Total Mean 08/31/2021 73.2 Normal -0.5 0.948 g/cm2 -17.1% Yes DualFemur Total Mean 03/06/2017 68.7 Normal 1.1 1.144 g/cm2 - - ASSESSMENT: The BMD measured at Femur Neck Left is 0.921 g/cm2 with a T-score of -0.8. This patient is considered normal according to World Health Organization [facility]) criteria. The scan quality is good. Compared with prior study, there has been significant decrease in the spine. Compared with prior study, there has been significant decrease in the total hip. World [facility]) criteria for post-menopausal, Caucasian Women: Normal:                   T-score at or above -1 SD Osteopenia/low bone mass: T-score between -1 and -2.5 SD Osteoporosis:             T-score at or below -2.5 SD RECOMMENDATIONS: 1. All patients should optimize  calcium and vitamin D intake. 2. Consider FDA-approved medical therapies in postmenopausal women and men aged 76 years and older, based on the following: a. A hip or vertebral(clinical or morphometric) fracture b. T-score < -2.5 at the femoral neck or spine after appropriate evaluation to exclude secondary causes c. Low bone mass (T-score between -1.0 and -2.5 at the femoral neck or spine) and a 10-year probability of a hip fracture > 3% or a 10-year probability of a major osteoporosis-related fracture > 20% based on the [place]-adapted WHO algorithm 3. Clinician judgment and/or patient preferences may indicate treatment for people with 10-year fracture probabilities above or below these levels FOLLOW-UP: People with diagnosed cases of osteoporosis or at high risk for fracture should have regular bone mineral density tests. For patients eligible for Medicare, routine testing is allowed once every 2 years. The testing frequency can be increased to one year for patients who have rapidly progressing disease, those who are receiving or discontinuing medical therapy to restore bone  mass, or have additional risk factors. I have reviewed this report, and agree with the above findings. [facility] Electronically Signed   By: Charlett Nose M.D.   On: 08/31/2021 11:56   MM 3D SCREEN BREAST BILATERAL  Result Date: 09/03/2021 CLINICAL DATA:  Screening. EXAM: DIGITAL SCREENING BILATERAL MAMMOGRAM WITH TOMOSYNTHESIS AND CAD TECHNIQUE: Bilateral screening digital craniocaudal and mediolateral oblique mammograms were obtained. Bilateral screening digital breast tomosynthesis was performed. The images were evaluated with computer-aided detection. COMPARISON:  Previous exam(s). ACR Breast Density Category b: There are scattered areas of fibroglandular density. FINDINGS: Bilateral prominent axillary lymph nodes. No mammographic evidence of malignancy in either breast. IMPRESSION: Further evaluation is suggested for  possible prominent bilateral axillary lymph nodes. RECOMMENDATION: Bilateral axillary ultrasound. The patient will be contacted regarding the findings, and additional imaging will be scheduled. BI-RADS CATEGORY  0: Incomplete. Need additional imaging evaluation and/or prior mammograms for comparison. Electronically Signed   By: Ted Mcalpine M.D.   On: 09/03/2021 16:43   Assessment and Plan:   Marilyn Weber is a 75 y.o. 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      Plan 1.  Barium enema showed no abnormalities.  Recommended CT colonography which we will help her get scheduled   2.  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  If issue recurs then will repeat course     Dr Marilyn Mood  MD,MRCP [facility]) Follow up in 3 months

## 2021-09-08 ENCOUNTER — Other Ambulatory Visit: Payer: Self-pay | Admitting: Family Medicine

## 2021-09-08 DIAGNOSIS — R928 Other abnormal and inconclusive findings on diagnostic imaging of breast: Secondary | ICD-10-CM

## 2021-09-10 ENCOUNTER — Ambulatory Visit
Admission: RE | Admit: 2021-09-10 | Discharge: 2021-09-10 | Disposition: A | Discharge status: Home / Self Care | Payer: Medicare Other | Source: Ambulatory Visit | Attending: Family Medicine | Admitting: Family Medicine

## 2021-09-10 ENCOUNTER — Other Ambulatory Visit: Payer: Self-pay

## 2021-09-10 DIAGNOSIS — R928 Other abnormal and inconclusive findings on diagnostic imaging of breast: Secondary | ICD-10-CM

## 2021-09-10 IMAGING — US US BREAST*R* LIMITED INC AXILLA
1 series · 5 of 5 positions shown · non-contrast
Comparison: Previous exam(s).

CLINICAL DATA: The patient was called back from screening
mammography due to bilateral abnormal axillary lymph nodes.

EXAM:
ULTRASOUND OF THE BILATERAL BREAST

[Series 1: us breast*right* limited inc axilla · 0.07mm/px · 5 of 5 slices shown]
[im 1/5]
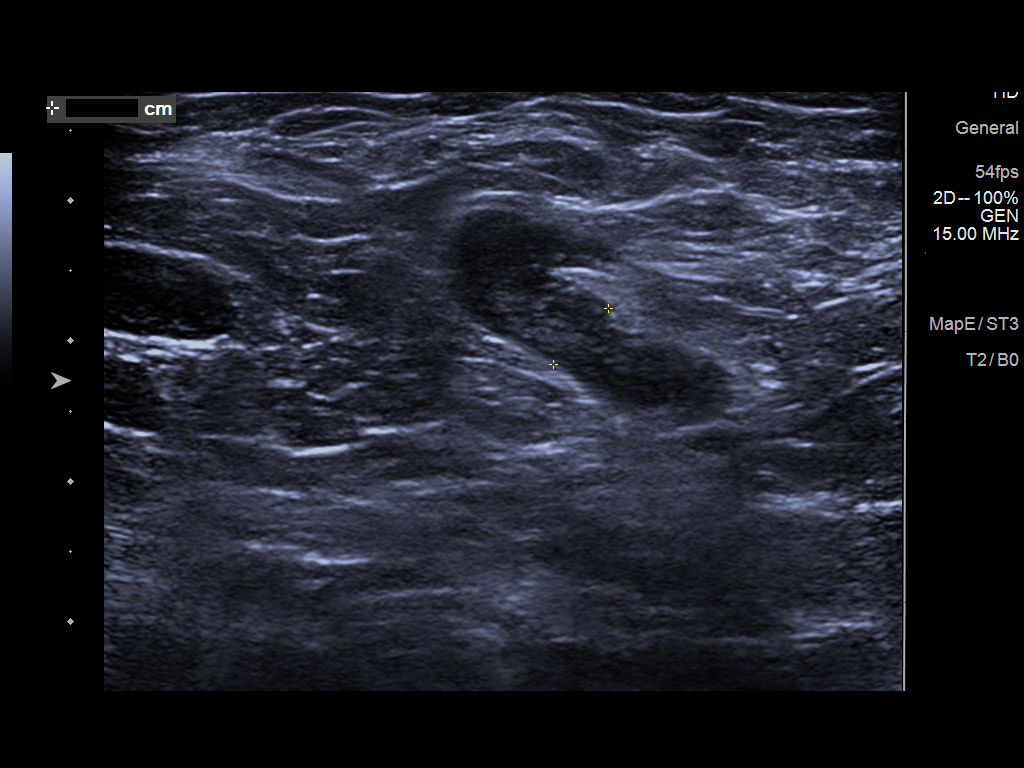
[im 2/5]
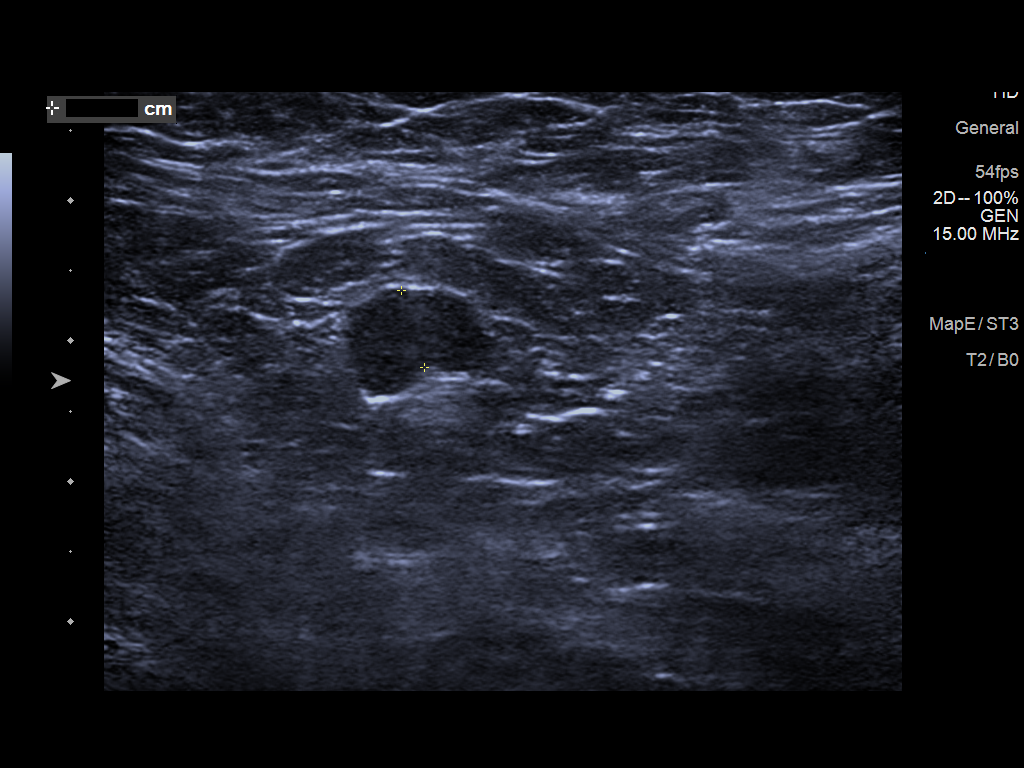
[im 3/5]
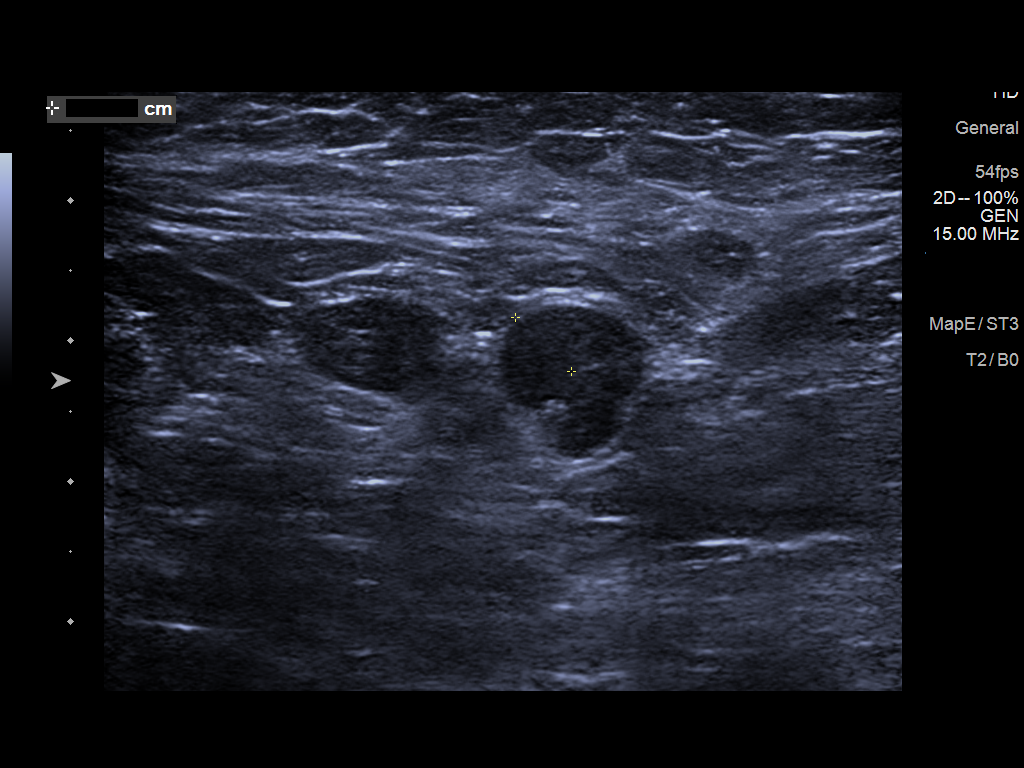
[im 4/5]
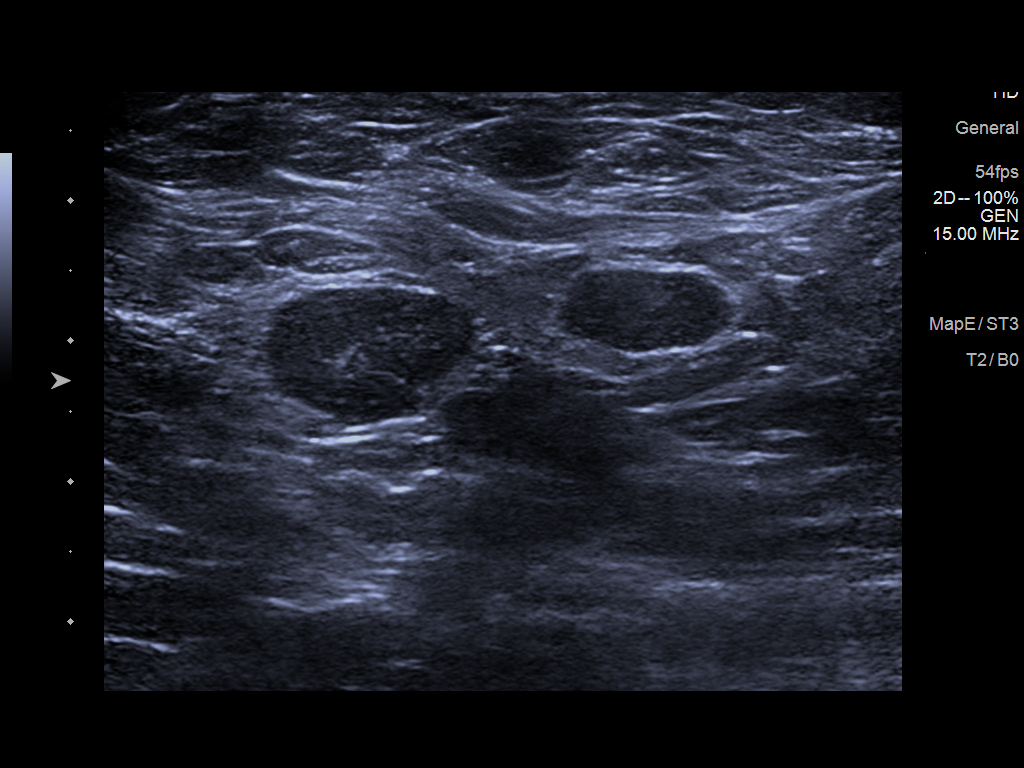
[im 5/5]
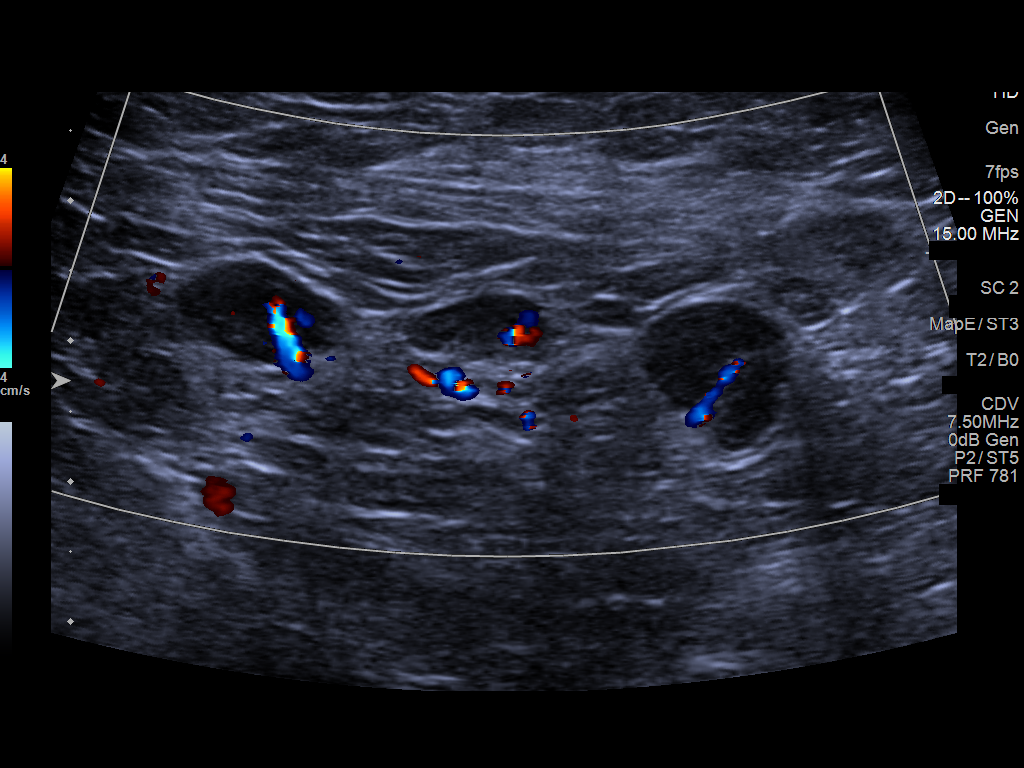

[5 of 5 positions shown; findings below may reference images not displayed]

FINDINGS: Targeted ultrasound is performed, showing bilateral abnormal
axillary lymph nodes. The lymph nodes bilaterally demonstrate
thickened cortices but retained hila.
IMPRESSION: Abnormal bilateral axillary lymph nodes.

RECOMMENDATION:
Recommend ultrasound-guided biopsy of 1 of the abnormal axillary
lymph nodes. Recommend targeting the lymph node on the right with a
cortex measuring nearly 6 mm and restriction of the fatty hilum.

I have discussed the findings and recommendations with the patient.
If applicable, a reminder letter will be sent to the patient
regarding the next appointment.

BI-RADS CATEGORY  4: Suspicious.

## 2021-09-10 IMAGING — US US BREAST*L* LIMITED INC AXILLA
1 series · 5 of 5 positions shown · non-contrast
Comparison: Previous exam(s).

CLINICAL DATA: The patient was called back from screening
mammography due to bilateral abnormal axillary lymph nodes.

EXAM:
ULTRASOUND OF THE BILATERAL BREAST

[Series 1: us breast*left* limited inc axilla · 0.07mm/px · 5 of 5 slices shown]
[im 1/5]
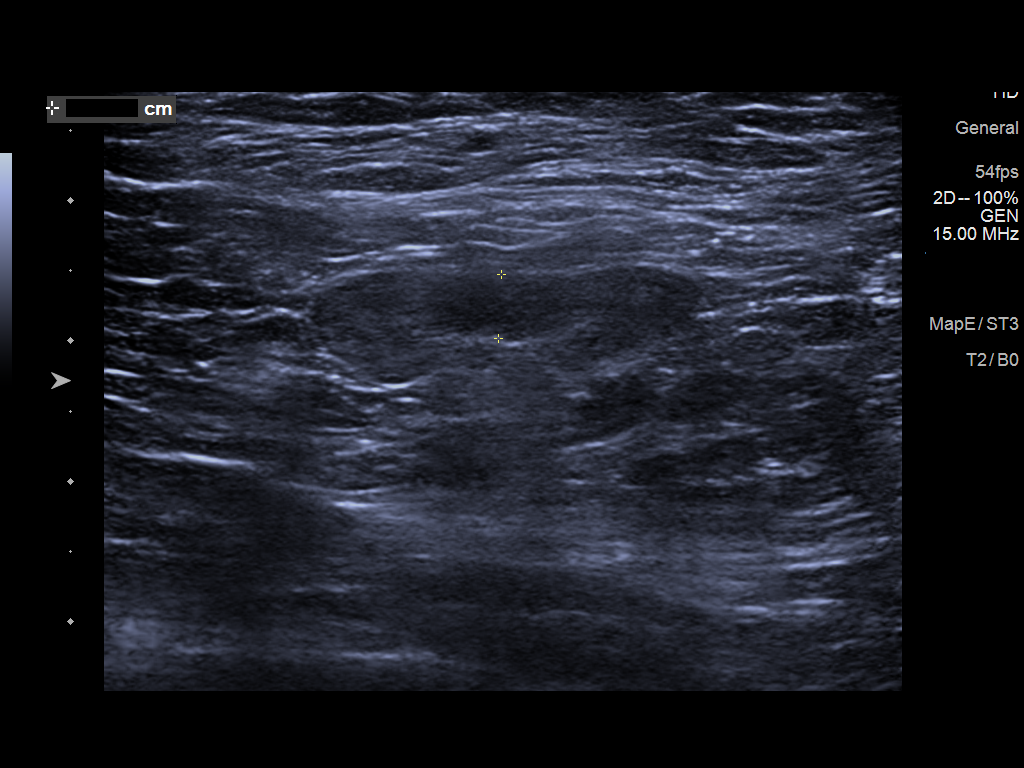
[im 2/5]
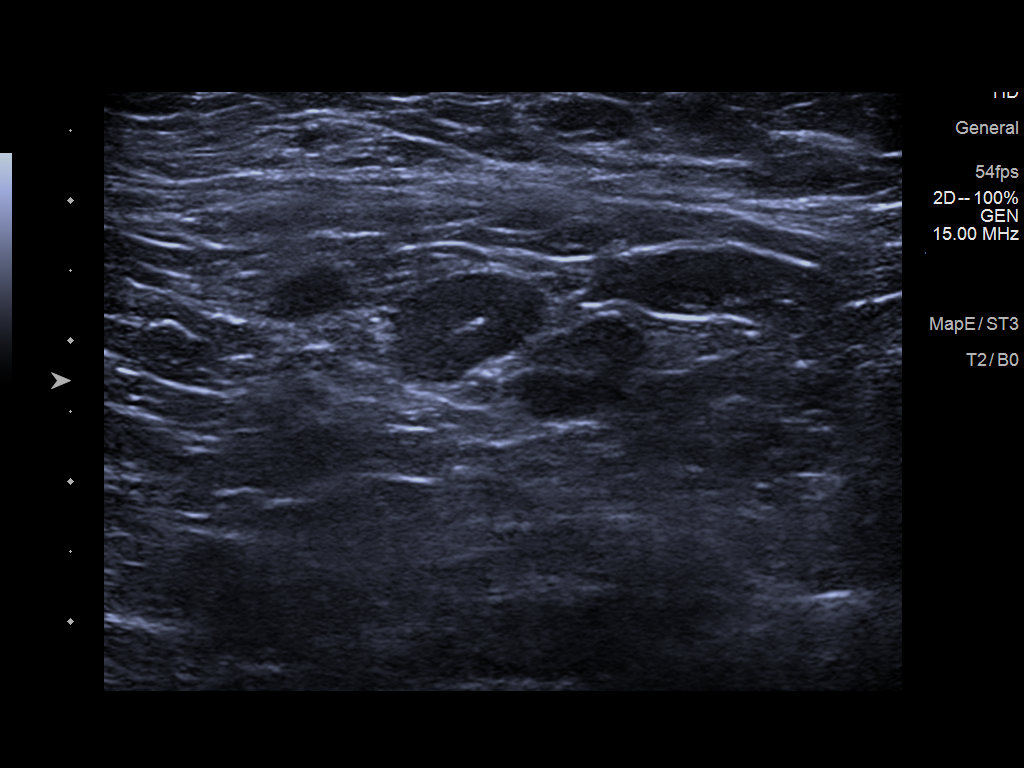
[im 3/5]
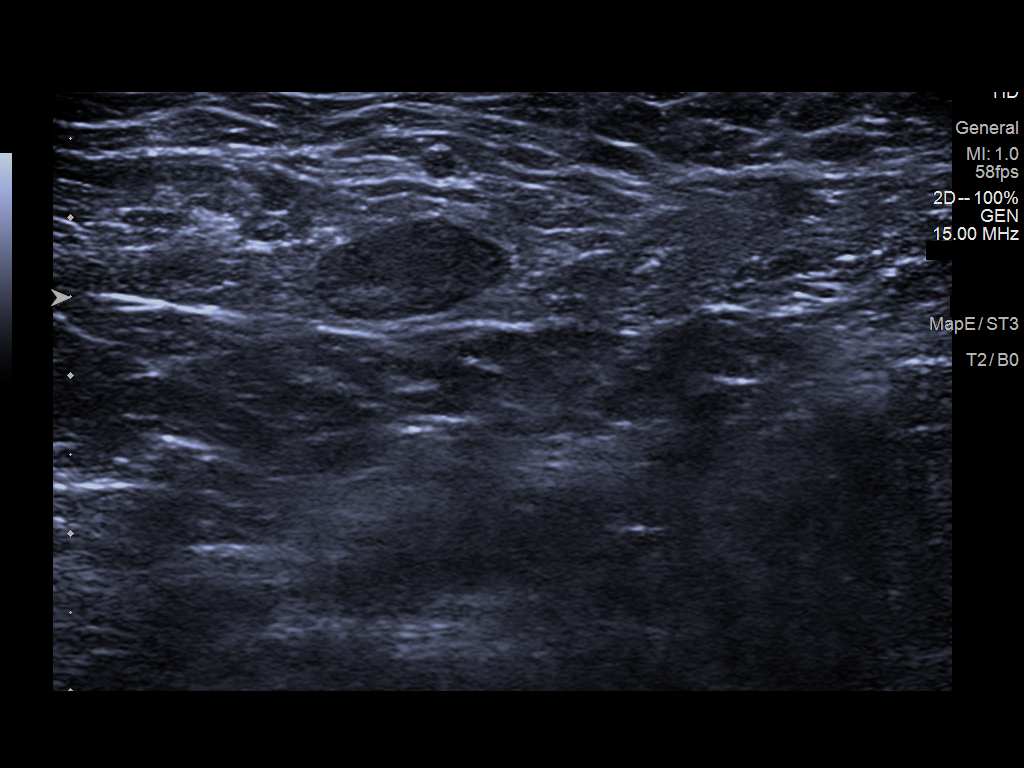
[im 4/5]
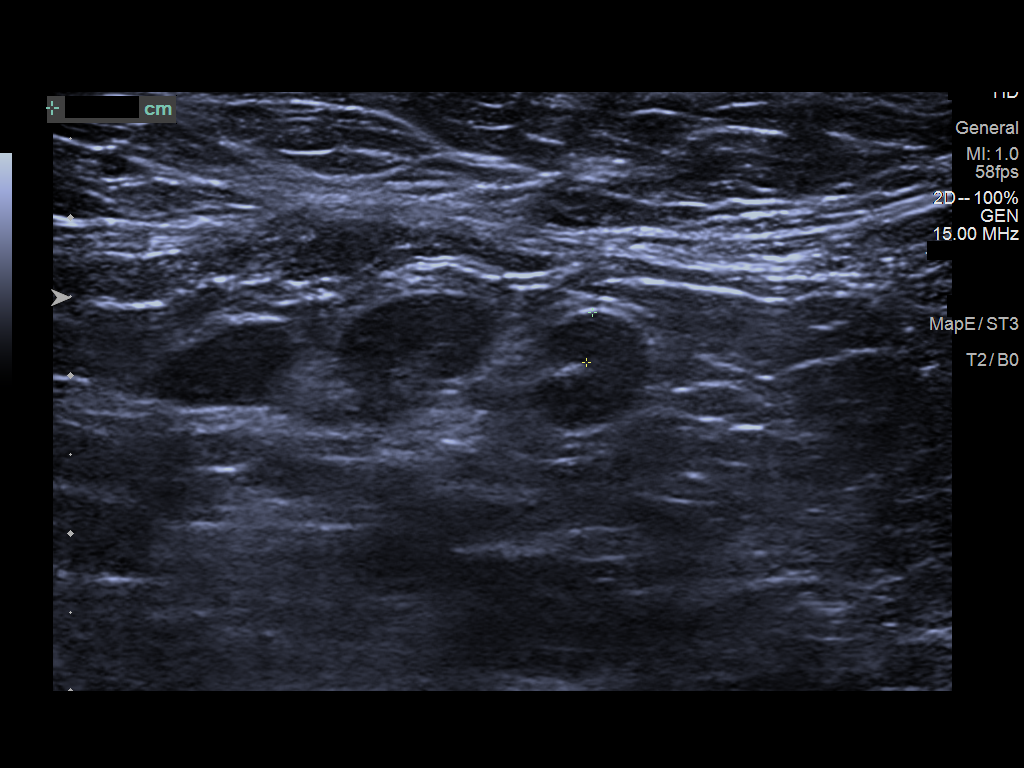
[im 5/5]
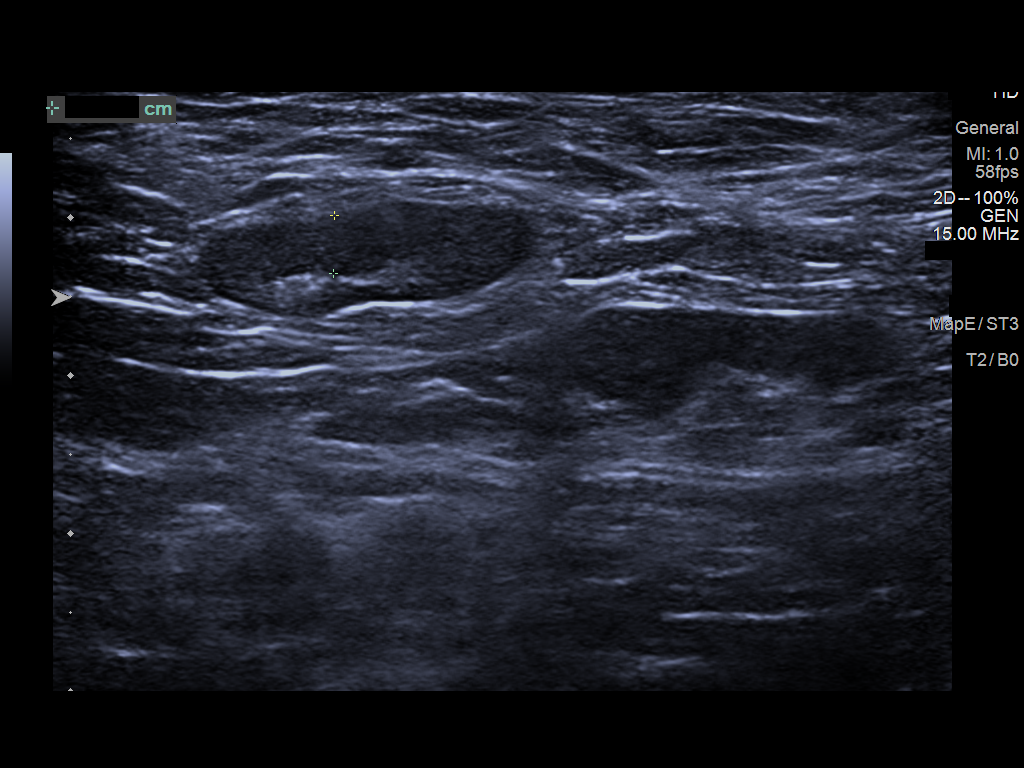

[5 of 5 positions shown; findings below may reference images not displayed]

FINDINGS: Targeted ultrasound is performed, showing bilateral abnormal
axillary lymph nodes. The lymph nodes bilaterally demonstrate
thickened cortices but retained hila.
IMPRESSION: Abnormal bilateral axillary lymph nodes.

RECOMMENDATION:
Recommend ultrasound-guided biopsy of 1 of the abnormal axillary
lymph nodes. Recommend targeting the lymph node on the right with a
cortex measuring nearly 6 mm and restriction of the fatty hilum.

I have discussed the findings and recommendations with the patient.
If applicable, a reminder letter will be sent to the patient
regarding the next appointment.

BI-RADS CATEGORY  4: Suspicious.

## 2021-09-20 ENCOUNTER — Other Ambulatory Visit: Payer: Self-pay | Admitting: Family Medicine

## 2021-09-20 DIAGNOSIS — R928 Other abnormal and inconclusive findings on diagnostic imaging of breast: Secondary | ICD-10-CM

## 2021-09-24 ENCOUNTER — Other Ambulatory Visit: Payer: Self-pay

## 2021-09-24 ENCOUNTER — Ambulatory Visit
Admission: RE | Admit: 2021-09-24 | Discharge: 2021-09-24 | Disposition: A | Discharge status: Home / Self Care | Payer: Medicare Other | Source: Ambulatory Visit | Attending: Family Medicine | Admitting: Family Medicine

## 2021-09-24 DIAGNOSIS — R928 Other abnormal and inconclusive findings on diagnostic imaging of breast: Secondary | ICD-10-CM | POA: Insufficient documentation

## 2021-09-24 IMAGING — MG US  BREAST BX W/ LOC DEV 1ST LESION IMG BX SPEC US GUIDE*R*
1 series · 8 of 8 positions shown · non-contrast
Comparison: Previous exam(s).
COMPARISON: Previous exam(s).

Addendum:
CLINICAL DATA: Mildly enlarged bilateral axillary lymph nodes at
recent mammography and ultrasound.

EXAM:
ULTRASOUND GUIDED RIGHT AXILLA CORE NEEDLE BIOPSY

[Series 1: MG view · 0.07mm/px · 8 of 16 slices shown]
[im 1/16]
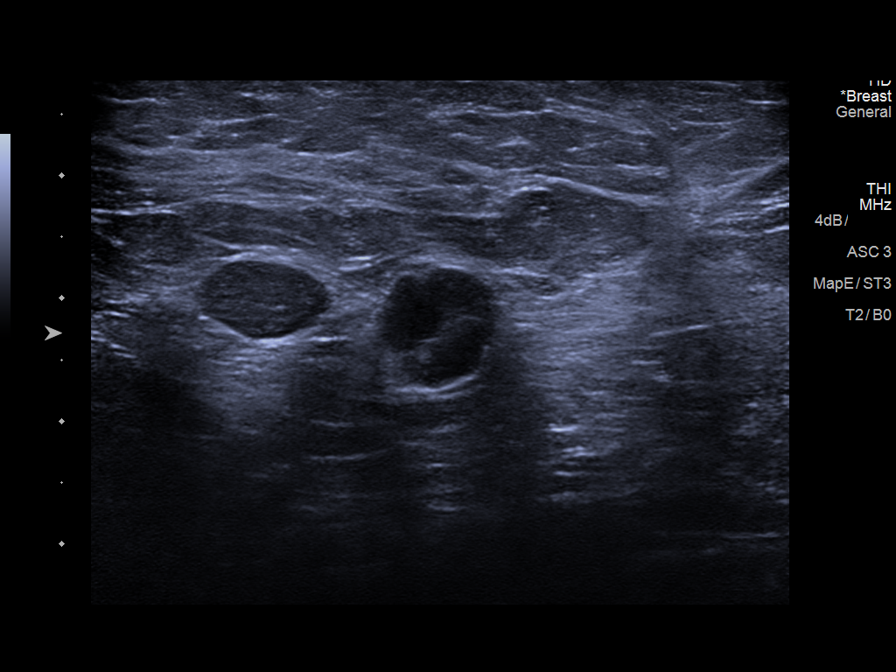
[im 3/16]
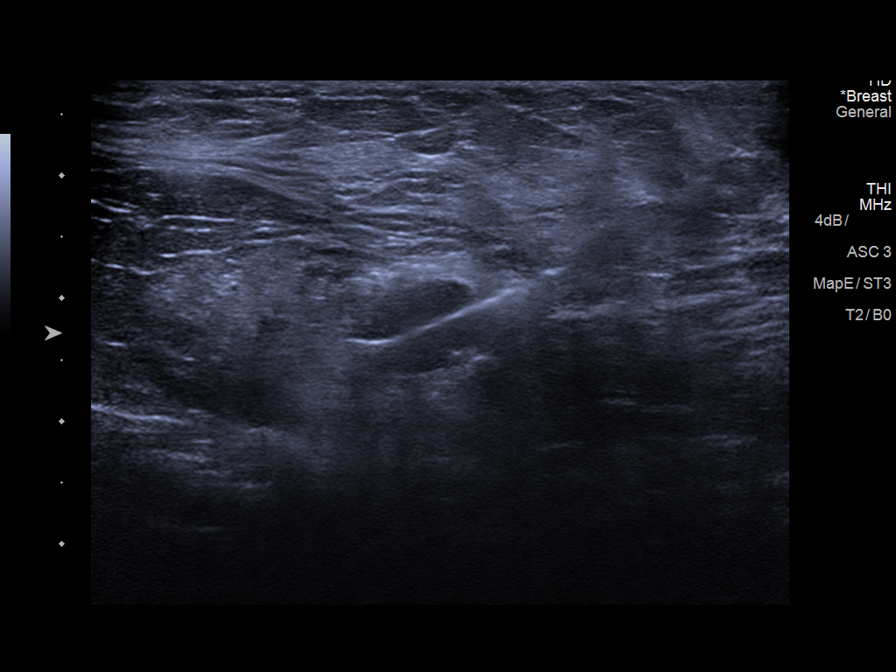
[im 5/16]
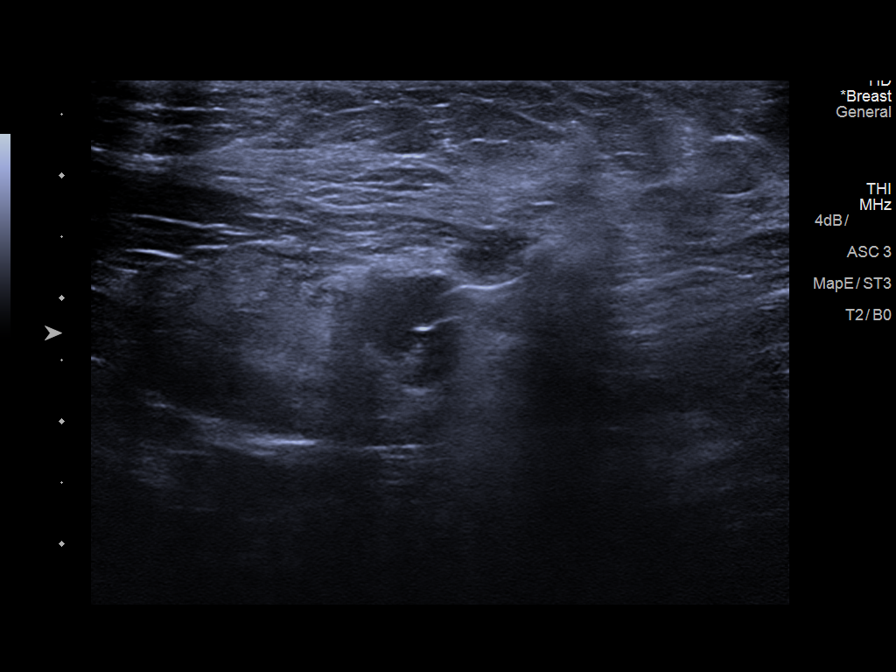
[im 7/16]
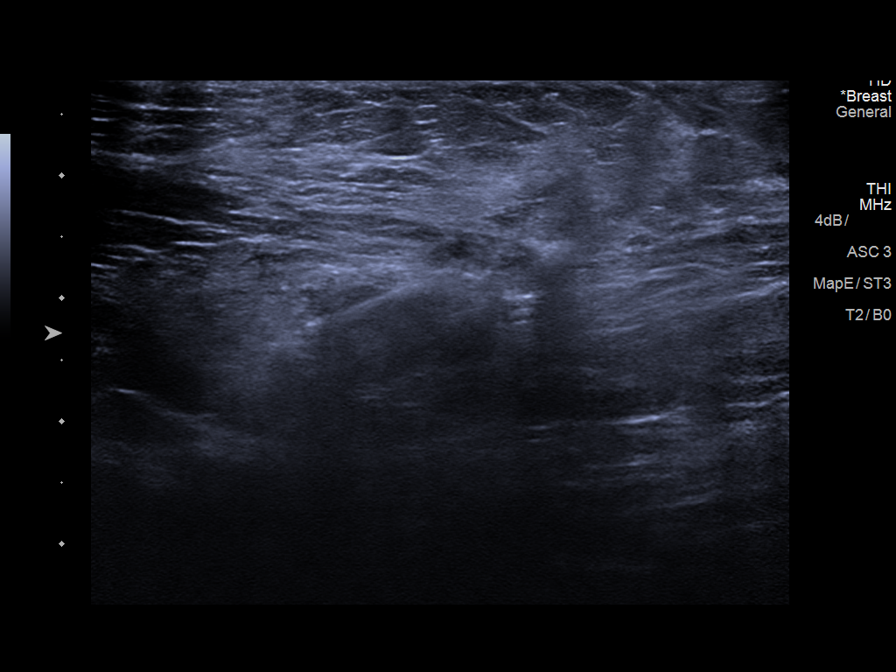
[im 9/16]
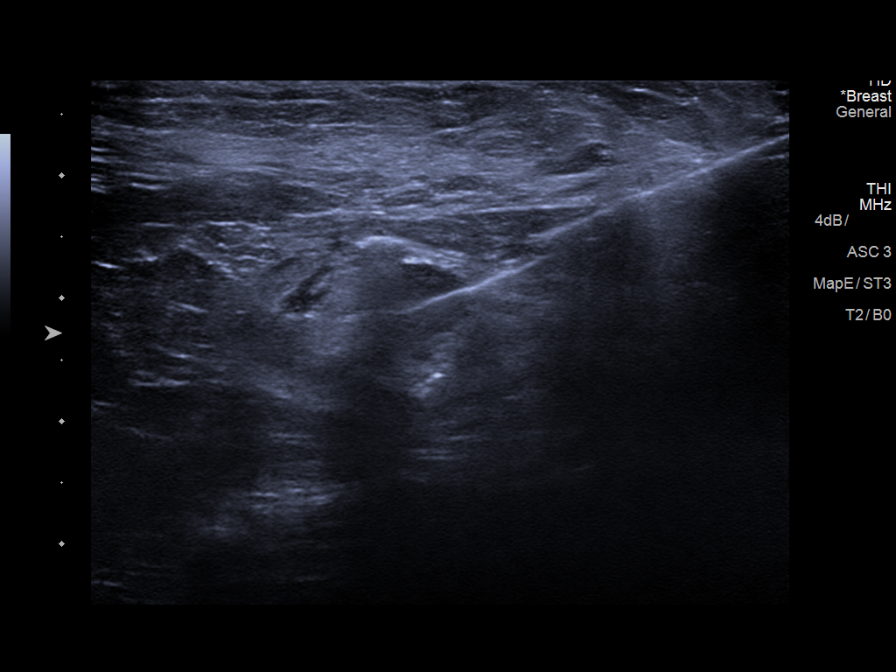
[im 11/16]
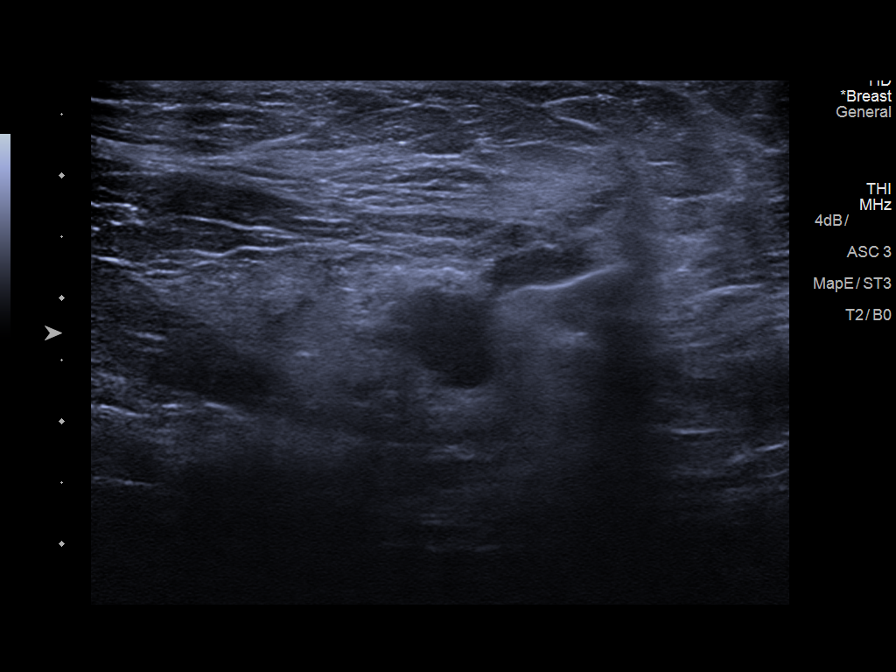
[im 13/16]
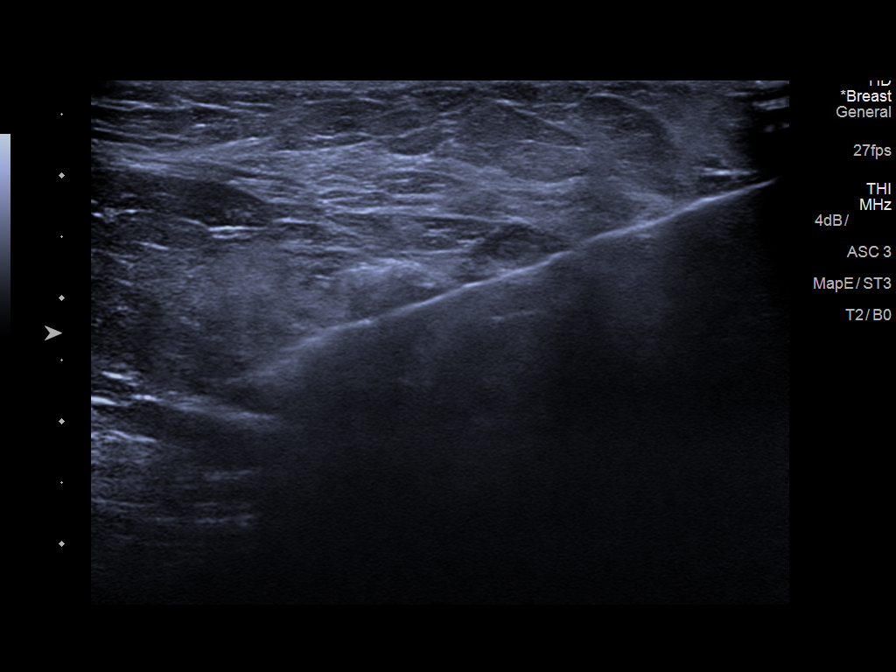
[im 16/16]
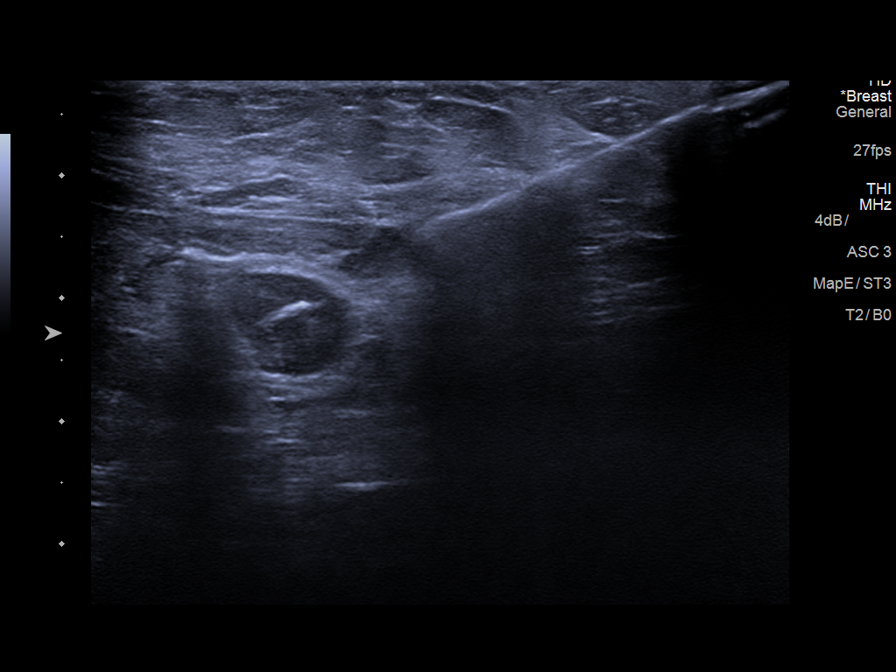

[8 of 8 positions shown; findings below may reference images not displayed]



Using sterile technique and 1% Lidocaine as local anesthetic, under
direct ultrasound visualization, a 14 gauge NYA device was
used to perform biopsy of the right axillary lymph node with 6 mm of
cortical thickening and hilar compression seen at recent ultrasound
using a caudal approach. At the conclusion of the procedure a spiral
shaped HydroMARK tissue marker clip was deployed into the biopsy
cavity. This was well seen within the lymph node following
deployment. Therefore, a post procedure diagnostic mammogram was not
obtained.
IMPRESSION: Ultrasound guided biopsy of a recently demonstrated right axillary
lymph node with cortical thickening and hilar compression. No
apparent complications.

ADDENDUM:
PATHOLOGY revealed: A. LYMPH NODE, RIGHT AXILLARY; ULTRASOUND-GUIDED
BIOPSY: - SAMPLING OF UNREMARKABLE LYMPH NODE TISSUE (4 MM), ADIPOSE
TISSUE, AND BLOOD CLOT. Comment: Material was submitted to [REDACTED] for Molecular Biology and Pathology for flow cytometric
analysis. Per report there is no immunophenotypic evidence of
monoclonal B cells or aberrant T cell population to suggest a B cell
or T cell lymphoproliferative disorder. The viability of the
specimen was low (75%). See scanned report in NYA. Correlation with
radiographic and clinical impression is required to determine if the
above findings are representative. Depending on the level of
clinical concern an excisional biopsy could be considered for
further evaluation.

Pathology results are CONCORDANT with imaging findings, per Dr.
NYA.

Pathology results and recommendations were discussed with patient
via telephone on [DATE]. Patient reported biopsy site doing well
with no adverse symptoms, and only slight tenderness at the site.
Post biopsy care instructions were reviewed, questions were answered
and my direct phone number was provided. Patient was instructed to
call [HOSPITAL] for any additional questions or concerns
related to biopsy site.

Recommendation: Patient instructed to continue monthly self breast
examinations, clinical follow-up with provider for subsequent or
persistent concerns, and return for annual bilateral screening
mammogram due [DATE].

Pathology results reported by [facility]A RN on [DATE].



Using sterile technique and 1% Lidocaine as local anesthetic, under
direct ultrasound visualization, a 14 gauge NYA device was
used to perform biopsy of the right axillary lymph node with 6 mm of
cortical thickening and hilar compression seen at recent ultrasound
using a caudal approach. At the conclusion of the procedure a spiral
shaped HydroMARK tissue marker clip was deployed into the biopsy
cavity. This was well seen within the lymph node following
deployment. Therefore, a post procedure diagnostic mammogram was not
obtained.
IMPRESSION: Ultrasound guided biopsy of a recently demonstrated right axillary
lymph node with cortical thickening and hilar compression. No
apparent complications.

## 2021-09-27 ENCOUNTER — Encounter: Payer: Self-pay | Admitting: Diagnostic Radiology

## 2021-09-27 LAB — SURGICAL PATHOLOGY

## 2021-11-20 ENCOUNTER — Encounter: Payer: Self-pay | Admitting: Pathology

## 2021-12-06 ENCOUNTER — Ambulatory Visit: Payer: Medicare Other | Admitting: Gastroenterology

## 2022-01-20 ENCOUNTER — Other Ambulatory Visit: Payer: Self-pay

## 2022-01-20 ENCOUNTER — Ambulatory Visit: Payer: Medicare Other | Admitting: Gastroenterology

## 2022-02-01 ENCOUNTER — Other Ambulatory Visit: Payer: Self-pay

## 2022-02-01 ENCOUNTER — Ambulatory Visit (INDEPENDENT_AMBULATORY_CARE_PROVIDER_SITE_OTHER): Payer: Medicare Other | Admitting: Gastroenterology

## 2022-02-01 ENCOUNTER — Encounter: Payer: Self-pay | Admitting: Gastroenterology

## 2022-02-01 VITALS — BP 134/85 | HR 81 | Temp 98.7°F | Wt 201.2 lb

## 2022-02-01 DIAGNOSIS — K208 Other esophagitis without bleeding: Secondary | ICD-10-CM | POA: Diagnosis not present

## 2022-02-01 DIAGNOSIS — Z1211 Encounter for screening for malignant neoplasm of colon: Secondary | ICD-10-CM

## 2022-02-01 NOTE — Progress Notes (Signed)
Jonathon Bellows MD, MRCP(U.K) [address]  [place]  [place], [place] [address]  Main: [phone]  Fax: [phone]   Primary Care Physician: [facility]  Primary Gastroenterologist:  Dr. Jonathon Bellows   Chief Complaint  Patient presents with   lymphocytic esophagitis    HPI: Marilyn Weber is a 76 y.o. 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  She was initially referred and seen on 02/16/2021 for GERD and diarrhea..  She has had diarrhea for many years.  Dysphagia also was mention.  Lab work in November 2021: Creatinine 1.86 alkaline phosphatase 169.  No prior colonoscopy.Takes omeprazole 40 mg once a day.  Does not really take it all the time before breakfast.   02/16/2021: Hemoglobin 13.9 g, CMP: Creatinine 1.08 alkaline phosphatase 134, C. difficile testing by PCR negative 02/19/2021: EGD nonobstructing Schatzki's ring at the GE junction dilated to 18 mm, biopsies of esophagus taken to rule out eosinophilic esophagitis that were suggestive of lymphocytic esophagitis .    She also underwent a colonoscopy at the same timefor chronic diarrhea and I noted severe stenosis at the splenic flexure which could not be transversed   06/02/2021 no evidence of colonic mass or polyps.  Suggested to obtain a CT enterography since colonoscopy was incomplete.    Interval history 09/07/2021-02/01/2022  She states that she could not get the CT colonography after last visit because she does not have transportation to go to [place].  We are unable to get the CT colonography anywhere nearby.  She states she does not have any issues with swallowing.  Sometimes has issues with regurgitation.  She has not been taking her omeprazole on a daily basis.  She has rather been taking as an as-needed basis.   Denies any diarrhea or lower GI symptoms.  Current Outpatient Medications  Medication Sig Dispense Refill   ARIPiprazole (ABILIFY) 5 MG tablet Take  1 tablet by mouth daily.     aspirin 81 MG EC tablet daily.     empagliflozin (JARDIANCE) 25 MG TABS tablet Take 25 mg by mouth daily.      fluticasone (FLONASE) 50 MCG/ACT nasal spray Place into both nostrils.     fluticasone-salmeterol (ADVAIR) 250-50 MCG/ACT AEPB 1 puff     gabapentin (NEURONTIN) 300 MG/6ML solution 1 tablet     glipiZIDE (GLUCOTROL) 5 MG tablet Take 1 tablet (5 mg total) by mouth 2 (two) times daily before a meal. 60 tablet 1   hydrALAZINE (APRESOLINE) 25 MG tablet Take 25 mg by mouth 3 (three) times daily.     hydrochlorothiazide (HYDRODIURIL) 25 MG tablet Take 1 tablet (25 mg total) by mouth daily. 30 tablet 1   hydrOXYzine (ATARAX/VISTARIL) 25 MG tablet Take 25 mg by mouth 3 (three) times daily as needed.     insulin glargine (LANTUS) 100 UNIT/ML injection Inject 0.15 mLs (15 Units total) into the skin at bedtime. 10 mL 1   Liniments (YAGERS LINIMENT) 3.1-8.2 % LIQD Apply topically as needed.     meloxicam (MOBIC) 15 MG tablet Take 15 mg by mouth daily.     mirtazapine (REMERON) 15 MG tablet 7 mg at bedtime.     omeprazole (PRILOSEC) 20 MG capsule Take 20 mg by mouth daily.     ondansetron (ZOFRAN) 8 MG tablet 1 tablet as needed     propranolol (INDERAL) 20 MG tablet Take 20 mg by mouth every 6 (six) hours as needed.  QC ACETAMINOPHEN 8HR ARTH PAIN 650 MG CR tablet SMARTSIG:1-2 Tablet(s) By Mouth Every 8 Hours PRN     UNITHROID 112 MCG tablet Take 112 mcg by mouth daily.     No current facility-administered medications for this visit.    Allergies as of 02/01/2022 - Review Complete 02/01/2022  Allergen Reaction Noted   Lisinopril Swelling 10/01/2016   Penicillins Rash 10/01/2016    ROS:  General: Negative for anorexia, weight loss, fever, chills, fatigue, weakness. ENT: Negative for hoarseness, difficulty swallowing , nasal congestion. CV: Negative for chest pain, angina, palpitations, dyspnea on exertion, peripheral edema.  Respiratory: Negative for  dyspnea at rest, dyspnea on exertion, cough, sputum, wheezing.  GI: See history of present illness. GU:  Negative for dysuria, hematuria, urinary incontinence, urinary frequency, nocturnal urination.  Endo: Negative for unusual weight change.    Physical Examination:   BP 134/85    Pulse 81    Temp 98.7 F (37.1 C) (Oral)    Wt 201 lb 3.2 oz (91.3 kg)    BMI 34.54 kg/m   General: Well-nourished, well-developed in no acute distress.  Eyes: No icterus. Conjunctivae pink. Mouth: Oropharyngeal mucosa moist and pink , no lesions erythema or exudate. Neuro: Alert and oriented x 3.  Grossly intact. Skin: Warm and dry, no jaundice.   Psych: Alert and cooperative, normal mood and affect.   Imaging Studies: No results found.  Assessment and Plan:   Marilyn Weber is a 76 y.o. 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  At this point of time her dysphagia has resolved with a course of steroids for lymphocytic esophagitis.  No recurrence.  Some regurgitation history suggestive of acid reflux.  She has not been taking her PPI regularly advised her to do so.  I explained to her that since her CT scan of the colon was not done I cannot confirm that she does or does not have a narrowing in the colon or any large polyps or cancer.  I explained to her that it is important for her to get a CT colonography since we could not get a complete colonoscopy.  She understands this but states that she does not have transportation to go to [place].   Dr Jonathon Bellows  MD,MRCP [facility]) Follow up in as needed

## 2022-02-09 ENCOUNTER — Other Ambulatory Visit: Payer: Self-pay | Admitting: *Deleted

## 2022-02-09 NOTE — Telephone Encounter (Signed)
error 

## 2022-06-29 ENCOUNTER — Other Ambulatory Visit: Payer: Self-pay | Admitting: Family Medicine

## 2022-10-06 ENCOUNTER — Other Ambulatory Visit: Payer: Self-pay | Admitting: Family Medicine

## 2022-10-06 DIAGNOSIS — Z1231 Encounter for screening mammogram for malignant neoplasm of breast: Secondary | ICD-10-CM

## 2022-12-23 ENCOUNTER — Ambulatory Visit
Admission: RE | Admit: 2022-12-23 | Discharge: 2022-12-23 | Disposition: A | Discharge status: Home / Self Care | Payer: Medicare Other | Source: Ambulatory Visit | Attending: Family Medicine | Admitting: Family Medicine

## 2022-12-23 DIAGNOSIS — Z1231 Encounter for screening mammogram for malignant neoplasm of breast: Secondary | ICD-10-CM | POA: Diagnosis present

## 2023-06-14 ENCOUNTER — Emergency Department: Payer: 59 | Admitting: Registered Nurse

## 2023-06-14 ENCOUNTER — Encounter
Admission: EM | Disposition: A | Discharge status: Home Health | Payer: Self-pay | Source: Home / Self Care | Attending: Surgery

## 2023-06-14 ENCOUNTER — Inpatient Hospital Stay
Admission: EM | Admit: 2023-06-14 | Discharge: 2023-06-20 | DRG: 330 | Disposition: A | Discharge status: Home Health | Payer: 59 | Attending: Surgery | Admitting: Surgery

## 2023-06-14 ENCOUNTER — Emergency Department: Payer: 59

## 2023-06-14 ENCOUNTER — Other Ambulatory Visit: Payer: Self-pay

## 2023-06-14 DIAGNOSIS — Z888 Allergy status to other drugs, medicaments and biological substances status: Secondary | ICD-10-CM

## 2023-06-14 DIAGNOSIS — G2581 Restless legs syndrome: Secondary | ICD-10-CM | POA: Diagnosis present

## 2023-06-14 DIAGNOSIS — K567 Ileus, unspecified: Secondary | ICD-10-CM | POA: Diagnosis not present

## 2023-06-14 DIAGNOSIS — E876 Hypokalemia: Secondary | ICD-10-CM | POA: Diagnosis present

## 2023-06-14 DIAGNOSIS — R109 Unspecified abdominal pain: Secondary | ICD-10-CM | POA: Diagnosis present

## 2023-06-14 DIAGNOSIS — Z7951 Long term (current) use of inhaled steroids: Secondary | ICD-10-CM

## 2023-06-14 DIAGNOSIS — E119 Type 2 diabetes mellitus without complications: Secondary | ICD-10-CM | POA: Diagnosis present

## 2023-06-14 DIAGNOSIS — Z7982 Long term (current) use of aspirin: Secondary | ICD-10-CM | POA: Diagnosis not present

## 2023-06-14 DIAGNOSIS — Z880 Allergy status to penicillin: Secondary | ICD-10-CM | POA: Diagnosis not present

## 2023-06-14 DIAGNOSIS — D72829 Elevated white blood cell count, unspecified: Secondary | ICD-10-CM | POA: Diagnosis present

## 2023-06-14 DIAGNOSIS — Z79890 Hormone replacement therapy (postmenopausal): Secondary | ICD-10-CM | POA: Diagnosis not present

## 2023-06-14 DIAGNOSIS — E039 Hypothyroidism, unspecified: Secondary | ICD-10-CM | POA: Diagnosis present

## 2023-06-14 DIAGNOSIS — Z791 Long term (current) use of non-steroidal anti-inflammatories (NSAID): Secondary | ICD-10-CM

## 2023-06-14 DIAGNOSIS — K436 Other and unspecified ventral hernia with obstruction, without gangrene: Secondary | ICD-10-CM

## 2023-06-14 DIAGNOSIS — F209 Schizophrenia, unspecified: Secondary | ICD-10-CM | POA: Diagnosis present

## 2023-06-14 DIAGNOSIS — Z79899 Other long term (current) drug therapy: Secondary | ICD-10-CM | POA: Diagnosis not present

## 2023-06-14 DIAGNOSIS — J45909 Unspecified asthma, uncomplicated: Secondary | ICD-10-CM | POA: Diagnosis present

## 2023-06-14 DIAGNOSIS — G894 Chronic pain syndrome: Secondary | ICD-10-CM | POA: Diagnosis present

## 2023-06-14 DIAGNOSIS — K55069 Acute infarction of intestine, part and extent unspecified: Secondary | ICD-10-CM | POA: Diagnosis present

## 2023-06-14 DIAGNOSIS — K460 Unspecified abdominal hernia with obstruction, without gangrene: Principal | ICD-10-CM

## 2023-06-14 DIAGNOSIS — Z794 Long term (current) use of insulin: Secondary | ICD-10-CM | POA: Diagnosis not present

## 2023-06-14 DIAGNOSIS — I10 Essential (primary) hypertension: Secondary | ICD-10-CM | POA: Diagnosis present

## 2023-06-14 DIAGNOSIS — Z7984 Long term (current) use of oral hypoglycemic drugs: Secondary | ICD-10-CM | POA: Diagnosis not present

## 2023-06-14 DIAGNOSIS — Z801 Family history of malignant neoplasm of trachea, bronchus and lung: Secondary | ICD-10-CM

## 2023-06-14 DIAGNOSIS — K437 Other and unspecified ventral hernia with gangrene: Principal | ICD-10-CM | POA: Diagnosis present

## 2023-06-14 HISTORY — PX: BOWEL RESECTION: SHX1257

## 2023-06-14 HISTORY — DX: Other and unspecified ventral hernia with obstruction, without gangrene: K43.6

## 2023-06-14 HISTORY — PX: LAPAROTOMY: SHX154

## 2023-06-14 HISTORY — PX: ABDOMINAL WALL DEFECT REPAIR: SHX53

## 2023-06-14 LAB — COMPREHENSIVE METABOLIC PANEL
ALT: 30 U/L (ref 0–44)
AST: 40 U/L (ref 15–41)
Albumin: 3.7 g/dL (ref 3.5–5.0)
Alkaline Phosphatase: 77 U/L (ref 38–126)
Anion gap: 13 (ref 5–15)
BUN: 14 mg/dL (ref 8–23)
CO2: 22 mmol/L (ref 22–32)
Calcium: 9 mg/dL (ref 8.9–10.3)
Chloride: 104 mmol/L (ref 98–111)
Creatinine, Ser: 1.03 mg/dL — ABNORMAL HIGH (ref 0.44–1.00)
GFR, Estimated: 57 mL/min — ABNORMAL LOW (ref >60)
Glucose, Bld: 191 mg/dL — ABNORMAL HIGH (ref 70–99)
Potassium: 2.2 mmol/L — CL (ref 3.5–5.1)
Sodium: 139 mmol/L (ref 135–145)
Total Bilirubin: 0.8 mg/dL (ref 0.3–1.2)
Total Protein: 7.8 g/dL (ref 6.5–8.1)

## 2023-06-14 LAB — CBC
HCT: 45.4 % (ref 36.0–46.0)
Hemoglobin: 14.2 g/dL (ref 12.0–15.0)
MCH: 25.5 pg — ABNORMAL LOW (ref 26.0–34.0)
MCHC: 31.3 g/dL (ref 30.0–36.0)
MCV: 81.7 fL (ref 80.0–100.0)
Platelets: 309 10*3/uL (ref 150–400)
RBC: 5.56 MIL/uL — ABNORMAL HIGH (ref 3.87–5.11)
RDW: 16 % — ABNORMAL HIGH (ref 11.5–15.5)
WBC: 16 10*3/uL — ABNORMAL HIGH (ref 4.0–10.5)
nRBC: 0 % (ref 0.0–0.2)

## 2023-06-14 LAB — LIPASE, BLOOD: Lipase: 25 U/L (ref 11–51)

## 2023-06-14 LAB — MAGNESIUM: Magnesium: 2.2 mg/dL (ref 1.7–2.4)

## 2023-06-14 SURGERY — LAPAROTOMY, EXPLORATORY
Anesthesia: General | Site: Abdomen

## 2023-06-14 MED ORDER — SODIUM CHLORIDE 0.9 % IV BOLUS
500.0000 mL | Freq: Once | INTRAVENOUS | Status: AC
  Administered 2023-06-14: 500 mL via INTRAVENOUS

## 2023-06-14 MED ORDER — HYDROMORPHONE HCL 1 MG/ML IJ SOLN
0.5000 mg | Freq: Once | INTRAMUSCULAR | Status: AC
  Administered 2023-06-14: 0.5 mg via INTRAVENOUS

## 2023-06-14 MED ORDER — DEXAMETHASONE SODIUM PHOSPHATE 10 MG/ML IJ SOLN
INTRAMUSCULAR | Status: AC
  Filled 2023-06-14: qty 1

## 2023-06-14 MED ORDER — POTASSIUM CHLORIDE 10 MEQ/100ML IV SOLN
10.0000 meq | INTRAVENOUS | Status: AC
  Administered 2023-06-14 (×2): 10 meq via INTRAVENOUS
  Filled 2023-06-14 (×2): qty 100

## 2023-06-14 MED ORDER — ONDANSETRON HCL 4 MG/2ML IJ SOLN
INTRAMUSCULAR | Status: AC
  Filled 2023-06-14: qty 2

## 2023-06-14 MED ORDER — ACETAMINOPHEN 10 MG/ML IV SOLN
INTRAVENOUS | Status: DC | PRN
  Administered 2023-06-14: 1000 mg via INTRAVENOUS

## 2023-06-14 MED ORDER — IOHEXOL 300 MG/ML  SOLN
100.0000 mL | Freq: Once | INTRAMUSCULAR | Status: AC | PRN
  Administered 2023-06-14: 100 mL via INTRAVENOUS

## 2023-06-14 MED ORDER — SUGAMMADEX SODIUM 200 MG/2ML IV SOLN
INTRAVENOUS | Status: DC | PRN
  Administered 2023-06-14: 326.4 mg via INTRAVENOUS

## 2023-06-14 MED ORDER — ONDANSETRON HCL 4 MG/2ML IJ SOLN
INTRAMUSCULAR | Status: DC | PRN
  Administered 2023-06-14: 4 mg via INTRAVENOUS

## 2023-06-14 MED ORDER — SUCCINYLCHOLINE CHLORIDE 200 MG/10ML IV SOSY
PREFILLED_SYRINGE | INTRAVENOUS | Status: DC | PRN
  Administered 2023-06-14: 140 mg via INTRAVENOUS

## 2023-06-14 MED ORDER — BUPIVACAINE LIPOSOME 1.3 % IJ SUSP
INTRAMUSCULAR | Status: AC
  Filled 2023-06-14: qty 20

## 2023-06-14 MED ORDER — PHENYLEPHRINE 80 MCG/ML (10ML) SYRINGE FOR IV PUSH (FOR BLOOD PRESSURE SUPPORT)
PREFILLED_SYRINGE | INTRAVENOUS | Status: AC
  Filled 2023-06-14: qty 10

## 2023-06-14 MED ORDER — BUPIVACAINE-EPINEPHRINE (PF) 0.25% -1:200000 IJ SOLN
INTRAMUSCULAR | Status: DC | PRN
  Administered 2023-06-14: 50 mL via INTRAMUSCULAR

## 2023-06-14 MED ORDER — PROPOFOL 10 MG/ML IV BOLUS
INTRAVENOUS | Status: DC | PRN
  Administered 2023-06-14: 90 mg via INTRAVENOUS

## 2023-06-14 MED ORDER — FENTANYL CITRATE (PF) 100 MCG/2ML IJ SOLN
INTRAMUSCULAR | Status: AC
  Filled 2023-06-14: qty 2

## 2023-06-14 MED ORDER — LIDOCAINE HCL (PF) 2 % IJ SOLN
INTRAMUSCULAR | Status: AC
  Filled 2023-06-14: qty 5

## 2023-06-14 MED ORDER — 0.9 % SODIUM CHLORIDE (POUR BTL) OPTIME
TOPICAL | Status: DC | PRN
  Administered 2023-06-14: 2500 mL

## 2023-06-14 MED ORDER — HYDROMORPHONE HCL 1 MG/ML IJ SOLN
0.5000 mg | Freq: Once | INTRAMUSCULAR | Status: AC
  Administered 2023-06-14: 0.5 mg via INTRAVENOUS
  Filled 2023-06-14: qty 0.5

## 2023-06-14 MED ORDER — HYDROMORPHONE HCL 1 MG/ML IJ SOLN
INTRAMUSCULAR | Status: AC
  Filled 2023-06-14: qty 0.5

## 2023-06-14 MED ORDER — ACETAMINOPHEN 10 MG/ML IV SOLN
INTRAVENOUS | Status: AC
  Filled 2023-06-14: qty 100

## 2023-06-14 MED ORDER — LIDOCAINE HCL (CARDIAC) PF 100 MG/5ML IV SOSY
PREFILLED_SYRINGE | INTRAVENOUS | Status: DC | PRN
  Administered 2023-06-14: 100 mg via INTRAVENOUS

## 2023-06-14 MED ORDER — PROPOFOL 10 MG/ML IV BOLUS
INTRAVENOUS | Status: AC
  Filled 2023-06-14: qty 20

## 2023-06-14 MED ORDER — EPINEPHRINE PF 1 MG/ML IJ SOLN
INTRAMUSCULAR | Status: AC
  Filled 2023-06-14: qty 1

## 2023-06-14 MED ORDER — BUPIVACAINE HCL (PF) 0.25 % IJ SOLN
INTRAMUSCULAR | Status: AC
  Filled 2023-06-14: qty 30

## 2023-06-14 MED ORDER — SODIUM CHLORIDE 0.9 % IV SOLN
2.0000 g | Freq: Two times a day (BID) | INTRAVENOUS | Status: DC
  Administered 2023-06-14: 2 g via INTRAVENOUS
  Filled 2023-06-14: qty 2

## 2023-06-14 MED ORDER — DEXAMETHASONE SODIUM PHOSPHATE 10 MG/ML IJ SOLN
INTRAMUSCULAR | Status: DC | PRN
  Administered 2023-06-14: 10 mg via INTRAVENOUS

## 2023-06-14 MED ORDER — FENTANYL CITRATE (PF) 100 MCG/2ML IJ SOLN
INTRAMUSCULAR | Status: DC | PRN
  Administered 2023-06-14: 50 ug via INTRAVENOUS

## 2023-06-14 MED ORDER — ROCURONIUM BROMIDE 10 MG/ML (PF) SYRINGE
PREFILLED_SYRINGE | INTRAVENOUS | Status: AC
  Filled 2023-06-14: qty 10

## 2023-06-14 MED ORDER — SUCCINYLCHOLINE CHLORIDE 200 MG/10ML IV SOSY
PREFILLED_SYRINGE | INTRAVENOUS | Status: AC
  Filled 2023-06-14: qty 10

## 2023-06-14 MED ORDER — ROCURONIUM BROMIDE 100 MG/10ML IV SOLN
INTRAVENOUS | Status: DC | PRN
  Administered 2023-06-14: 40 mg via INTRAVENOUS

## 2023-06-14 MED ORDER — SODIUM CHLORIDE 0.9 % IV SOLN: INTRAVENOUS | Status: DC | PRN

## 2023-06-14 SURGICAL SUPPLY — 43 items
APL PRP STRL LF DISP 70% ISPRP (MISCELLANEOUS)
APPLIER CLIP 11 MED OPEN (CLIP)
APPLIER CLIP 13 LRG OPEN (CLIP)
APR CLP LRG 13 20 CLIP (CLIP)
APR CLP MED 11 20 MLT OPN (CLIP)
BLADE CLIPPER SURG (BLADE) IMPLANT
CHLORAPREP W/TINT 26 (MISCELLANEOUS) ×3 IMPLANT
CLIP APPLIE 11 MED OPEN (CLIP) IMPLANT
CLIP APPLIE 13 LRG OPEN (CLIP) IMPLANT
COVER BACK TABLE REUSABLE LG (DRAPES) ×3 IMPLANT
DRAPE C-SECTION (MISCELLANEOUS) ×3 IMPLANT
DRAPE LAPAROTOMY 100X77 ABD (DRAPES) IMPLANT
ELECT BLADE 6.5 EXT (BLADE) ×3 IMPLANT
ELECT CAUTERY BLADE 6.4 (BLADE) ×3 IMPLANT
ELECT REM PT RETURN 9FT ADLT (ELECTROSURGICAL) ×3
ELECTRODE REM PT RTRN 9FT ADLT (ELECTROSURGICAL) ×3 IMPLANT
GAUZE 4X4 16PLY ~~LOC~~+RFID DBL (SPONGE) ×3 IMPLANT
GLOVE ORTHO TXT STRL SZ7.5 (GLOVE) ×6 IMPLANT
GOWN STRL REUS W/ TWL LRG LVL3 (GOWN DISPOSABLE) ×6 IMPLANT
GOWN STRL REUS W/ TWL XL LVL3 (GOWN DISPOSABLE) ×6 IMPLANT
GOWN STRL REUS W/TWL LRG LVL3 (GOWN DISPOSABLE) ×6
GOWN STRL REUS W/TWL XL LVL3 (GOWN DISPOSABLE) ×6
HANDLE YANKAUER SUCT BULB TIP (MISCELLANEOUS) IMPLANT
LIGASURE IMPACT 36 18CM CVD LR (INSTRUMENTS) IMPLANT
MANIFOLD NEPTUNE II (INSTRUMENTS) ×3 IMPLANT
NS IRRIG 1000ML POUR BTL (IV SOLUTION) ×3 IMPLANT
PACK BASIN MAJOR ARMC (MISCELLANEOUS) ×3 IMPLANT
PACK COLON CLEAN CLOSURE (MISCELLANEOUS) IMPLANT
RELOAD LINEAR CUT PROX 55 BLUE (ENDOMECHANICALS) ×6 IMPLANT
RELOAD STAPLE 55 3.8 BLU REG (ENDOMECHANICALS) IMPLANT
SPONGE T-LAP 18X18 ~~LOC~~+RFID (SPONGE) ×12 IMPLANT
STAPLER GUN LINEAR PROX 60 (STAPLE) IMPLANT
STAPLER PROXIMATE 55 BLUE (STAPLE) IMPLANT
STAPLER SKIN PROX 35W (STAPLE) ×3 IMPLANT
SUT PDS AB 1 CT 36 (SUTURE) ×9 IMPLANT
SUT PDS AB 2-0 CT1 27 (SUTURE) IMPLANT
SUT SILK 3-0 (SUTURE) ×3 IMPLANT
SUT VIC AB 3-0 SH 27 (SUTURE) ×3
SUT VIC AB 3-0 SH 27X BRD (SUTURE) IMPLANT
SUT VICRYL 2-0 54IN ABS (SUTURE) IMPLANT
TRAP FLUID SMOKE EVACUATOR (MISCELLANEOUS) ×3 IMPLANT
TRAY FOLEY MTR SLVR 16FR STAT (SET/KITS/TRAYS/PACK) ×3 IMPLANT
WATER STERILE IRR 500ML POUR (IV SOLUTION) ×3 IMPLANT

## 2023-06-14 NOTE — ED Triage Notes (Signed)
First Nurse Note:  Pt via [facility] from home. Pt c/o abd pain and back pain. Home does not have AC. EMS gave her 1L LR and 4mg  Zofran. 20G in L forearm 139/90 51 HR 100% on RA

## 2023-06-14 NOTE — ED Provider Notes (Signed)
[facility] Provider Note    Event Date/Time   First MD Initiated Contact with Patient 06/14/23 1912     (approximate)   History   Abdominal Pain and Back Pain   HPI  KASIE LECCESE is a 77 y.o. female past medical history of diabetes, hypertension, hypothyroidism who presents with abdominal and flank pain.  Patient tells me she felt a knot in her left mid abdomen starting today and has had significant pain associated with it.  Has pain in the left flank and low back as well.  Has had nausea and vomiting as well as diarrhea.  Says she has had this before is not sure what it is from.  No fevers or chills no chest pain or dyspnea.     Past Medical History:  Diagnosis Date   Asthma    Diabetes mellitus without complication (HCC)    Hypertension    Hypothyroidism     Patient Active Problem List   Diagnosis Date Noted   Schizophrenia (HCC) 05/02/2020   Delusion (HCC) 05/02/2020   DM (diabetes mellitus) type 2, uncontrolled, with ketoacidosis (HCC) 05/02/2020   Essential hypertension 05/02/2020   Hyperthyroidism 05/02/2020   Restless leg syndrome 05/02/2020   Hereditary and idiopathic peripheral neuropathy 05/02/2020   Chronic pain syndrome 05/02/2020   Paranoia (HCC) 04/30/2020   Auditory hallucinations 04/30/2020     Physical Exam  Triage Vital Signs: ED Triage Vitals  Enc Vitals Group     BP 06/14/23 1758 (!) 145/76     Pulse Rate 06/14/23 1758 (!) 56     Resp 06/14/23 1758 20     Temp 06/14/23 1805 97.6 F (36.4 C)     Temp src --      SpO2 06/14/23 1758 100 %     Weight 06/14/23 1800 180 lb (81.6 kg)     Height 06/14/23 1800 5\' 4"  (1.626 m)     Head Circumference --      Peak Flow --      Pain Score 06/14/23 1800 10     Pain Loc --      Pain Edu? --      Excl. in GC? --     Most recent vital signs: Vitals:   06/14/23 1905 06/14/23 1930  BP: (!) 152/79 (!) 157/99  Pulse: (!) 44 (!) 49  Resp: (!) 9 18  Temp:    SpO2: 100% 100%      General: Awake, patient appears uncomfortable CV:  Good peripheral perfusion.  Resp:  Normal effort.  Abd:  No distention.  Patient has a large palpable mass occupying most of her left abdomen that is tender to palpation, no CVA tenderness abdomen is otherwise soft Neuro:             Awake, Alert, Oriented x 3  Other:     ED Results / Procedures / Treatments  Labs (all labs ordered are listed, but only abnormal results are displayed) Labs Reviewed  COMPREHENSIVE METABOLIC PANEL - Abnormal; Notable for the following components:      Result Value   Potassium 2.2 (*)    Glucose, Bld 191 (*)    Creatinine, Ser 1.03 (*)    GFR, Estimated 57 (*)    All other components within normal limits  CBC - Abnormal; Notable for the following components:   WBC 16.0 (*)    RBC 5.56 (*)    MCH 25.5 (*)    RDW 16.0 (*)  All other components within normal limits  LIPASE, BLOOD  MAGNESIUM     EKG     RADIOLOGY  I reviewed interpreted patient CT of the abdomen pelvis which shows a large spiculated hernia with concern for ischemic bowel  PROCEDURES:  Critical Care performed: No  .Critical Care  Performed by: Georga Hacking, MD Authorized by: Georga Hacking, MD   Critical care provider statement:    Critical care time (minutes):  30   Critical care was time spent personally by me on the following activities:  Development of treatment plan with patient or surrogate, discussions with consultants, evaluation of patient's response to treatment, examination of patient, ordering and review of laboratory studies, ordering and review of radiographic studies, ordering and performing treatments and interventions, pulse oximetry, re-evaluation of patient's condition and review of old charts   The patient is on the cardiac monitor to evaluate for evidence of arrhythmia and/or significant heart rate changes.   MEDICATIONS ORDERED IN ED: Medications  potassium chloride 10 mEq in  100 mL IVPB (10 mEq Intravenous New Bag/Given 06/14/23 2024)  HYDROmorphone (DILAUDID) 1 MG/ML injection (has no administration in time range)  sodium chloride 0.9 % bolus 500 mL (0 mLs Intravenous Stopped 06/14/23 1954)  HYDROmorphone (DILAUDID) injection 0.5 mg (0.5 mg Intravenous Given 06/14/23 2004)  iohexol (OMNIPAQUE) 300 MG/ML solution 100 mL (100 mLs Intravenous Contrast Given 06/14/23 2008)  HYDROmorphone (DILAUDID) injection 0.5 mg (0.5 mg Intravenous Given 06/14/23 2057)     IMPRESSION / MDM / ASSESSMENT AND PLAN / ED COURSE  I reviewed the triage vital signs and the nursing notes.                              Patient's presentation is most consistent with acute presentation with potential threat to life or bodily function.  Differential diagnosis includes, but is not limited to, fibroid, malignancy, hernia  Patient is a 77 year old female who presents with left mid abdominal pain and back pain and palpable mass.  She has had associated vomiting and diarrhea.  Patient bradycardic on arrival hypertensive.  She looks uncomfortable she is writhing around the stretcher.  Tells me she felt a knot in the left abdomen but she has felt before.  On exam I am able to palpate a rather large firm mass that occupies most of her left mid abdomen and the right side of her abdomen is soft.  Labs are notable for potassium of 2.2 and added a magnesium and ordered an EKG.  Patient has had vomiting and diarrhea today.  Will give IV potassium Dilaudid and obtain a CT of the abdomen pelvis to assess for etiology of this mass.  CT is concerning for strangulated hernia with ischemic bowel.  Spoke with Dr. Mariam Dollar who does recommend making an attempt at reduction which I did but had no success.  Dr. Claudine Mouton is taking the patient to the OR now.  Patient is been updated.  She is getting IV potassium.      FINAL CLINICAL IMPRESSION(S) / ED DIAGNOSES   Final diagnoses:  Incarcerated hernia  Hypokalemia      Rx / DC Orders   ED Discharge Orders     None        Note:  This document was prepared using Dragon voice recognition software and may include unintentional dictation errors.   Georga Hacking, MD 06/14/23 2112

## 2023-06-14 NOTE — H&P (Signed)
Patient ID: Marilyn Weber, female   DOB: Jun 01, 1946, 77 y.o.   MRN: [id number]  Chief Complaint:  Stangulated LLQ AA hernia  History of Present Illness Marilyn Weber is a 77 y.o. female with Acute onset of pain in a LLQ bulge noted a few months ago. Vomited on way to hospital.  Denies f/c.  Had GYN surgery, prior hernia surgery reported, she thinks on the right upper quadrant.   Past Medical History Past Medical History:  Diagnosis Date   Asthma    Diabetes mellitus without complication (HCC)    Hypertension    Hypothyroidism       Past Surgical History:  Procedure Laterality Date   ABDOMINAL HYSTERECTOMY     BREAST CYST ASPIRATION Bilateral    COLONOSCOPY WITH PROPOFOL N/A 03/11/2021   Procedure: COLONOSCOPY WITH PROPOFOL;  Surgeon: Wyline Mood, MD;  [facility] END[facility]COPY;  Service: Gastroenterology;  Laterality: N/A;   ESOPHAGOGASTRODUODENOSCOPY (EGD) WITH PROPOFOL N/A 03/11/2021   Procedure: ESOPHAGOGASTRODUODENOSCOPY (EGD) WITH PROPOFOL;  Surgeon: Wyline Mood, MD;  Location: [facility] ENDOSCOPY;  Service: Gastroenterology;  Laterality: N/A;   gallstones removed     HERNIA REPAIR     TUBAL LIGATION      Allergies  Allergen Reactions   Lisinopril Swelling    Tongue swelling Other reaction(s): anaphylaxis   Penicillins Rash    Has patient had a PCN reaction causing immediate rash, facial/tongue/throat swelling, SOB or lightheadedness with hypotension: YesYes Has patient had a PCN reaction causing severe rash involving mucus membranes or skin necrosis: No Has patient had a PCN reaction that required hospitalization:  Has patient had a PCN reaction occurring within the last 10 years: Unknown If all of the above answers are "NO", then may proceed with Cephalosporin use.     Current Facility-Administered Medications  Medication Dose Route Frequency Provider Last Rate Last Admin   cefoTEtan (CEFOTAN) 2 g in sodium chloride 0.9 % 100 mL IVPB  2 g Intravenous Q12H Campbell Lerner, MD  200 mL/hr at 06/14/23 2152 2 g at 06/14/23 2152   potassium chloride 10 mEq in 100 mL IVPB  10 mEq Intravenous Q1 Hr x 2 Georga Hacking, MD 75 mL/hr at 06/14/23 2153 Rate Change at 06/14/23 2153   Current Outpatient Medications  Medication Sig Dispense Refill   ARIPiprazole (ABILIFY) 5 MG tablet Take 1 tablet by mouth daily.     aspirin 81 MG EC tablet daily.     empagliflozin (JARDIANCE) 25 MG TABS tablet Take 25 mg by mouth daily.      fluticasone (FLONASE) 50 MCG/ACT nasal spray Place into both nostrils.     fluticasone-salmeterol (ADVAIR) 250-50 MCG/ACT AEPB 1 puff     gabapentin (NEURONTIN) 300 MG/6ML solution 1 tablet     glipiZIDE (GLUCOTROL) 5 MG tablet Take 1 tablet (5 mg total) by mouth 2 (two) times daily before a meal. 60 tablet 1   hydrALAZINE (APRESOLINE) 25 MG tablet Take 25 mg by mouth 3 (three) times daily.     hydrochlorothiazide (HYDRODIURIL) 25 MG tablet Take 1 tablet (25 mg total) by mouth daily. 30 tablet 1   hydrOXYzine (ATARAX/VISTARIL) 25 MG tablet Take 25 mg by mouth 3 (three) times daily as needed.     insulin glargine (LANTUS) 100 UNIT/ML injection Inject 0.15 mLs (15 Units total) into the skin at bedtime. 10 mL 1   Liniments (YAGERS LINIMENT) 3.1-8.2 % LIQD Apply topically as needed.     meloxicam (MOBIC) 15 MG tablet Take 15 mg  by mouth daily.     mirtazapine (REMERON) 15 MG tablet 7 mg at bedtime.     omeprazole (PRILOSEC) 20 MG capsule Take 20 mg by mouth daily.     ondansetron (ZOFRAN) 8 MG tablet 1 tablet as needed     propranolol (INDERAL) 20 MG tablet Take 20 mg by mouth every 6 (six) hours as needed.     QC ACETAMINOPHEN 8HR ARTH PAIN 650 MG CR tablet SMARTSIG:1-2 Tablet(s) By Mouth Every 8 Hours PRN     UNITHROID 112 MCG tablet Take 112 mcg by mouth daily.      Family History Family History  Problem Relation Age of Onset   Lung cancer Brother    Breast cancer Neg Hx       Social History Social History   Tobacco Use   Smoking status:  Never   Smokeless tobacco: Never  Vaping Use   Vaping Use: Never used  Substance Use Topics   Alcohol use: Never   Drug use: Never        Review of Systems  Unable to perform ROS: Acuity of condition     Physical Exam Blood pressure (!) 190/84, pulse (!) 52, temperature 97.8 F (36.6 C), temperature source Oral, resp. rate 12, height 5\' 4"  (1.626 m), weight 81.6 kg, SpO2 99 %. Last Weight  Most recent update: 06/14/2023  6:00 PM    Weight  81.6 kg (180 lb)             CONSTITUTIONAL: Well developed, and nourished, appropriately responsive and aware without distress.   EYES: Sclera non-icteric.   EARS, NOSE, MOUTH AND THROAT: Mask worn.   The oropharynx is clear. Oral mucosa is pink and moist.    Hearing is intact to voice.  NECK: Trachea is midline, and there is no jugular venous distension.  LYMPH NODES:  Lymph nodes in the neck are not appreciated. RESPIRATORY:  Lungs are clear, and breath sounds are equal bilaterally.  Normal respiratory effort without pathologic use of accessory muscles. CARDIOVASCULAR: Heart is regular in rate and rhythm.   Well perfused.  GI: The abdomen is notable for left lower quadrant mass, tender, non-reducible, o/w soft, nontender, and nondistended. There were no other palpable masses.  I did not appreciate hepatosplenomegaly.  MUSCULOSKELETAL:  Symmetrical muscle tone appreciated in all four extremities.    SKIN: Skin turgor is normal. No pathologic skin lesions appreciated.  NEUROLOGIC:  Motor and sensation appear grossly normal.  Cranial nerves are grossly without defect. PSYCH:  Alert and oriented to person, place and time. Affect is appropriate for situation.  Data Reviewed I have personally reviewed what is currently available of the patient's imaging, recent labs and medical records.   Labs:     Latest Ref Rng & Units 06/14/2023    6:02 PM 02/16/2021    2:55 PM 04/29/2020    9:07 PM  CBC  WBC 4.0 - 10.5 K/uL 16.0  9.4  8.9   Hemoglobin  12.0 - 15.0 g/dL 51.8  84.1  66.0   Hematocrit 36.0 - 46.0 % 45.4  41.1  43.4   Platelets 150 - 400 K/uL 309  300  306       Latest Ref Rng & Units 06/14/2023    6:02 PM 02/16/2021    2:55 PM 05/02/2020    7:37 AM  CMP  Glucose 70 - 99 mg/dL 630  160  90   BUN 8 - 23 mg/dL 14  13  11  Creatinine 0.44 - 1.00 mg/dL 4.09  8.11  9.14   Sodium 135 - 145 mmol/L 139  140  138   Potassium 3.5 - 5.1 mmol/L 2.2  4.0  3.5   Chloride 98 - 111 mmol/L 104  100  101   CO2 22 - 32 mmol/L 22  22  30    Calcium 8.9 - 10.3 mg/dL 9.0  9.3  9.9   Total Protein 6.5 - 8.1 g/dL 7.8  7.2    Total Bilirubin 0.3 - 1.2 mg/dL 0.8  <7.8    Alkaline Phos 38 - 126 U/L 77  134    AST 15 - 41 U/L 40  27    ALT 0 - 44 U/L 30  23        Imaging: Radiological images reviewed:   Within last 24 hrs: CT ABDOMEN PELVIS W CONTRAST  Result Date: 06/14/2023 CLINICAL DATA:  LLQ abdominal pain L sided palpable mass, pain EXAM: CT ABDOMEN AND PELVIS WITH CONTRAST TECHNIQUE: Multidetector CT imaging of the abdomen and pelvis was performed using the standard protocol following bolus administration of intravenous contrast. RADIATION DOSE REDUCTION: This exam was performed according to the departmental dose-optimization program which includes automated exposure control, adjustment of the mA and/or kV according to patient size and/or use of iterative reconstruction technique. CONTRAST:  OMNIPAQUE IOHEXOL 300 MG/ML  SOLN COMPARISON:  None Available. FINDINGS: Lower chest: No acute abnormality. Hepatobiliary: Diffusely atrophic. No focal lesion. Otherwise normal pancreatic contour. No surrounding inflammatory changes. No main pancreatic ductal dilatation. Pancreas: No focal lesion. Normal pancreatic contour. No surrounding inflammatory changes. No main pancreatic ductal dilatation. Spleen: Normal in size without focal abnormality. Adrenals/Urinary Tract: No adrenal nodule bilaterally. Bilateral kidneys enhance symmetrically. No  hydronephrosis. No hydroureter. Punctate left nephrolithiasis. No right nephrolithiasis. No ureterolithiasis. The urinary bladder is unremarkable. On delayed imaging, there is no urothelial wall thickening and there are no filling defects in the opacified portions of the bilateral collecting systems or ureters. Stomach/Bowel: Stomach is within normal limits. Large volume left spigelian hernia containing several loops of small bowel as well as a portion of the descending/sigmoid colon with non-enhancement of the contained bowel wall and associated mesenteric ischemia. Some bowel wall thickening is noted of the contained large bowel. No pneumatosis. No small or large bowel dilatation. Appendix appears normal. Vascular/Lymphatic: No abdominal aorta or iliac aneurysm. Mild atherosclerotic plaque of the aorta and its branches. No abdominal, pelvic, or inguinal lymphadenopathy. Reproductive: Status post hysterectomy. No adnexal masses. Other: Trace volume free fluid within the hernia. No intraperitoneal free gas. No organized fluid collection. Musculoskeletal: Tiny fat containing right inguinal hernia. No suspicious lytic or blastic osseous lesions. No acute displaced fracture. Multilevel degenerative changes of the spine. Grade 1 anterolisthesis of L5 on S1. IMPRESSION: 1. Large volume left spigelian hernia containing ischemic loops of small and large bowel. Recommend emergent surgical consultation. 2. Tiny fat containing right inguinal hernia. 3. Other imaging findings of potential clinical significance: Hepatic steatosis. Nonobstructive punctate left nephrolithiasis. Aortic Atherosclerosis (ICD10-I70.0). These results were called by telephone at the time of interpretation on 06/14/2023 at 8:48 pm to provider [facility] , who verbally acknowledged these results. Electronically Signed   By: Tish Frederickson M.D.   On: 06/14/2023 20:53    Assessment    Strangulated LLQ abdominal wall hernia.  Patient Active Problem  List   Diagnosis Date Noted   Schizophrenia (HCC) 05/02/2020   Delusion (HCC) 05/02/2020   DM (diabetes mellitus) type 2, uncontrolled,  with ketoacidosis (HCC) 05/02/2020   Essential hypertension 05/02/2020   Hyperthyroidism 05/02/2020   Restless leg syndrome 05/02/2020   Hereditary and idiopathic peripheral neuropathy 05/02/2020   Chronic pain syndrome 05/02/2020   Paranoia (HCC) 04/30/2020   Auditory hallucinations 04/30/2020    Plan    Exploratory laparotomy, small bowel resection, repair of AA hernia.   I discussed possibility of incarceration, strangulation, enlargement in size over time, and the need for emergency surgery in the face of these.  Also reviewed the techniques of reduction should incarceration occur, and when unsuccessful to present to the ED.  Also discussed that surgery risks include recurrence which can be up to 30% in the case of complex hernias, use of prosthetic materials (mesh) and the increased risk of infection and the possible need for re-operation and removal of mesh, possibility of post-op SBO or ileus, and the risks of general anesthetic including heart attack, stroke, sudden death or some reaction to anesthetic medications. The patient, and those present, appear to understand the risks, any and all questions were answered to the patient's satisfaction.  No guarantees were ever expressed or implied.   Declared emergency due to strangulated/ischemic appearance of incarcerated SB in LLQ hernia.    Face-to-face time spent with the patient and accompanying care providers(if present) was 45 minutes, with more than 50% of the time spent counseling, educating, and coordinating care of the patient.    These notes generated with voice recognition software. I apologize for typographical errors.  Campbell Lerner M.D., FACS 06/14/2023, 10:11 PM

## 2023-06-14 NOTE — ED Notes (Signed)
Attempted to get temperature, pt stated "leave me alone man, I need a room"

## 2023-06-14 NOTE — ED Notes (Signed)
Patient assisted to and from toilet in room. Pt changed into gown and all clothes removed at this time in preparation for transport to OR. Pt positioned self back in bed for comfort. Patient currently resting in bed free from sign of distress. Breathing unlabored speaking in full sentences with symmetric chest rise and fall. Bed low and locked with side rails raised x2. Call bell in reach and monitor in place.

## 2023-06-14 NOTE — ED Notes (Signed)
Patient transported to CT 

## 2023-06-14 NOTE — ED Notes (Signed)
Patient returned from CT

## 2023-06-14 NOTE — ED Notes (Signed)
Pt diaphoretic.  

## 2023-06-14 NOTE — Anesthesia Procedure Notes (Signed)
Procedure Name: Intubation Date/Time: 06/14/2023 10:31 PM  Performed by: Stormy Fabian, CRNAPre-anesthesia Checklist: Patient identified, Emergency Drugs available, Suction available and Patient being monitored Patient Re-evaluated:Patient Re-evaluated prior to induction Oxygen Delivery Method: Circle system utilized Preoxygenation: Pre-oxygenation with 100% oxygen Induction Type: IV induction, Cricoid Pressure applied and Rapid sequence Ventilation: Mask ventilation without difficulty Laryngoscope Size: Mac and 3 Grade View: Grade II Tube type: Oral Tube size: 7.0 mm Number of attempts: 1 Airway Equipment and Method: Stylet Placement Confirmation: ETT inserted through vocal cords under direct vision, positive ETCO2 and breath sounds checked- equal and bilateral Secured at: 21 cm Tube secured with: Tape Dental Injury: Teeth and Oropharynx as per pre-operative assessment

## 2023-06-14 NOTE — ED Provider Triage Note (Signed)
Emergency Medicine Provider Triage Evaluation Note  Marilyn Weber , a 77 y.o. female  was evaluated in triage.  Pt complains of cramping in left lower abdomen and pain throughout back. EMS reports home does not have air conditioner. Patient states pain started today and has had 1 episode of vomiting.  Physical Exam  There were no vitals taken for this visit. Gen:   Awake, no distress   Resp:  Normal effort  MSK:   Moves extremities without difficulty  Other:    Medical Decision Making  Medically screening exam initiated at 5:58 PM.  Appropriate orders placed.  AGNIESZKA NEWHOUSE was informed that the remainder of the evaluation will be completed by another provider, this initial triage assessment does not replace that evaluation, and the importance of remaining in the ED until their evaluation is complete.    Chinita Pester, FNP 06/14/23 1800

## 2023-06-14 NOTE — ED Notes (Signed)
RN attempted to contact pt daughter, Marylene Land per pt request to inform her of plan to take pt to OR. Daughter did not answer and RN left message requesting her to call back.

## 2023-06-14 NOTE — Anesthesia Preprocedure Evaluation (Signed)
Anesthesia Evaluation  Patient identified by MRN, date of birth, ID band Patient awake    Reviewed: Allergy & Precautions, H&P , NPO status , Patient's Chart, lab work & pertinent test results, reviewed documented beta blocker date and time   Airway Mallampati: II  TM Distance: >3 FB Neck ROM: full    Dental  (+) Teeth Intact, Upper Dentures   Pulmonary asthma    Pulmonary exam normal        Cardiovascular Exercise Tolerance: Poor hypertension, On Medications negative cardio ROS Normal cardiovascular exam Rhythm:regular Rate:Normal  Potassium noted and patient has received multiple K+ runs. Secondary to the emergent nature of the case, we will proceed. ja   Neuro/Psych  PSYCHIATRIC DISORDERS    Schizophrenia   Neuromuscular disease    GI/Hepatic negative GI ROS, Neg liver ROS,,,  Endo/Other  diabetes, Well ControlledHypothyroidism Hyperthyroidism   Renal/GU negative Renal ROS  negative genitourinary   Musculoskeletal   Abdominal   Peds  Hematology negative hematology ROS (+)   Anesthesia Other Findings Past Medical History: No date: Asthma No date: Diabetes mellitus without complication (HCC) No date: Hypertension No date: Hypothyroidism Past Surgical History: No date: ABDOMINAL HYSTERECTOMY No date: BREAST CYST ASPIRATION; Bilateral 03/11/2021: COLONOSCOPY WITH PROPOFOL; N/A     Comment:  Procedure: COLONOSCOPY WITH PROPOFOL;  Surgeon: Wyline Mood, MD;  Location: [facility] ENDOSCOPY;  Service:               Gastroenterology;  Laterality: N/A; 03/11/2021: ESOPHAGOGASTRODUODENOSCOPY (EGD) WITH PROPOFOL; N/A     Comment:  Procedure: ESOPHAGOGASTRODUODENOSCOPY (EGD) WITH               PROPOFOL;  Surgeon: Wyline Mood, MD;  Location: [facility]               ENDOSCOPY;  Service: Gastroenterology;  Laterality: N/A; No date: gallstones removed No date: HERNIA REPAIR No date: TUBAL LIGATION BMI    Body Mass  Index: 30.90 kg/m     Reproductive/Obstetrics negative OB ROS                             Anesthesia Physical Anesthesia Plan  ASA: 3 and emergent  Anesthesia Plan: General ETT   Post-op Pain Management:    Induction:   PONV Risk Score and Plan: 4 or greater  Airway Management Planned:   Additional Equipment:   Intra-op Plan:   Post-operative Plan:   Informed Consent: I have reviewed the patients History and Physical, chart, labs and discussed the procedure including the risks, benefits and alternatives for the proposed anesthesia with the patient or authorized representative who has indicated his/her understanding and acceptance.     Dental Advisory Given  Plan Discussed with: CRNA  Anesthesia Plan Comments:        Anesthesia Quick Evaluation

## 2023-06-14 NOTE — ED Triage Notes (Signed)
See first nurse note, pt reports back and abd cramping started today. Pt states she is not sure if she had a heat exposure. Pt moaning in triage asking to be put in a room d/t pain.

## 2023-06-14 NOTE — ED Notes (Signed)
ED Provider at bedside. 

## 2023-06-14 NOTE — ED Notes (Signed)
MD at bedside attempting to reduce hernia

## 2023-06-15 ENCOUNTER — Other Ambulatory Visit: Payer: Self-pay

## 2023-06-15 ENCOUNTER — Encounter: Payer: Self-pay | Admitting: Surgery

## 2023-06-15 DIAGNOSIS — K436 Other and unspecified ventral hernia with obstruction, without gangrene: Secondary | ICD-10-CM

## 2023-06-15 LAB — BASIC METABOLIC PANEL
Anion gap: 5 (ref 5–15)
BUN: 15 mg/dL (ref 8–23)
CO2: 25 mmol/L (ref 22–32)
Calcium: 8.2 mg/dL — ABNORMAL LOW (ref 8.9–10.3)
Chloride: 109 mmol/L (ref 98–111)
Creatinine, Ser: 0.82 mg/dL (ref 0.44–1.00)
GFR, Estimated: >60 mL/min (ref >60)
Glucose, Bld: 168 mg/dL — ABNORMAL HIGH (ref 70–99)
Potassium: 3.3 mmol/L — ABNORMAL LOW (ref 3.5–5.1)
Sodium: 139 mmol/L (ref 135–145)

## 2023-06-15 LAB — GLUCOSE, CAPILLARY
Glucose-Capillary: 130 mg/dL — ABNORMAL HIGH (ref 70–99)
Glucose-Capillary: 148 mg/dL — ABNORMAL HIGH (ref 70–99)
Glucose-Capillary: 138 mg/dL — ABNORMAL HIGH (ref 70–99)

## 2023-06-15 LAB — CBG MONITORING, ED: Glucose-Capillary: 155 mg/dL — ABNORMAL HIGH (ref 70–99)

## 2023-06-15 MED ORDER — SODIUM CHLORIDE 0.9 % IV SOLN
2.0000 g | Freq: Two times a day (BID) | INTRAVENOUS | Status: AC
  Administered 2023-06-15: 2 g via INTRAVENOUS
  Filled 2023-06-15: qty 2

## 2023-06-15 MED ORDER — INSULIN ASPART 100 UNIT/ML IJ SOLN
0.0000 [IU] | Freq: Three times a day (TID) | INTRAMUSCULAR | Status: DC
  Administered 2023-06-15: 1 [IU] via SUBCUTANEOUS
  Administered 2023-06-17 – 2023-06-18 (×2): 2 [IU] via SUBCUTANEOUS
  Administered 2023-06-19: 1 [IU] via SUBCUTANEOUS
  Administered 2023-06-19 – 2023-06-20 (×2): 2 [IU] via SUBCUTANEOUS
  Administered 2023-06-20: 1 [IU] via SUBCUTANEOUS
  Filled 2023-06-15 (×6): qty 1

## 2023-06-15 MED ORDER — ACETAMINOPHEN 10 MG/ML IV SOLN: 1000.0000 mg | Freq: Once | INTRAVENOUS | Status: DC | PRN

## 2023-06-15 MED ORDER — DIPHENHYDRAMINE HCL 50 MG/ML IJ SOLN
12.5000 mg | Freq: Four times a day (QID) | INTRAMUSCULAR | Status: DC | PRN
  Administered 2023-06-15 – 2023-06-17 (×2): 12.5 mg via INTRAVENOUS
  Filled 2023-06-15 (×2): qty 1

## 2023-06-15 MED ORDER — HYDROCODONE-ACETAMINOPHEN 5-325 MG PO TABS
1.0000 | ORAL_TABLET | ORAL | Status: DC | PRN
  Administered 2023-06-15 – 2023-06-16 (×3): 1 via ORAL
  Administered 2023-06-17 (×2): 2 via ORAL
  Filled 2023-06-15: qty 1
  Filled 2023-06-15: qty 2
  Filled 2023-06-15: qty 1
  Filled 2023-06-15: qty 2
  Filled 2023-06-15: qty 1

## 2023-06-15 MED ORDER — FENTANYL CITRATE (PF) 100 MCG/2ML IJ SOLN
25.0000 ug | INTRAMUSCULAR | Status: DC | PRN
  Administered 2023-06-15 (×4): 25 ug via INTRAVENOUS

## 2023-06-15 MED ORDER — KETOROLAC TROMETHAMINE 15 MG/ML IJ SOLN
15.0000 mg | Freq: Four times a day (QID) | INTRAMUSCULAR | Status: AC | PRN
  Administered 2023-06-15 – 2023-06-17 (×5): 15 mg via INTRAVENOUS
  Filled 2023-06-15 (×5): qty 1

## 2023-06-15 MED ORDER — HYDROMORPHONE HCL 1 MG/ML IJ SOLN: 1.0000 mg | INTRAMUSCULAR | Status: DC | PRN

## 2023-06-15 MED ORDER — OXYCODONE HCL 5 MG/5ML PO SOLN: 5.0000 mg | Freq: Once | ORAL | Status: AC | PRN

## 2023-06-15 MED ORDER — ZOLPIDEM TARTRATE 5 MG PO TABS
5.0000 mg | ORAL_TABLET | Freq: Every evening | ORAL | Status: DC | PRN
  Administered 2023-06-15 – 2023-06-20 (×5): 5 mg via ORAL
  Filled 2023-06-15 (×5): qty 1

## 2023-06-15 MED ORDER — PANTOPRAZOLE SODIUM 40 MG IV SOLR
40.0000 mg | Freq: Every day | INTRAVENOUS | Status: DC
  Administered 2023-06-15 – 2023-06-19 (×5): 40 mg via INTRAVENOUS
  Filled 2023-06-15 (×5): qty 10

## 2023-06-15 MED ORDER — HYDRALAZINE HCL 20 MG/ML IJ SOLN: 10.0000 mg | INTRAMUSCULAR | Status: DC | PRN

## 2023-06-15 MED ORDER — METHOCARBAMOL 500 MG PO TABS
500.0000 mg | ORAL_TABLET | Freq: Four times a day (QID) | ORAL | Status: DC | PRN
  Administered 2023-06-20: 500 mg via ORAL
  Filled 2023-06-15: qty 1

## 2023-06-15 MED ORDER — FENTANYL CITRATE (PF) 100 MCG/2ML IJ SOLN
INTRAMUSCULAR | Status: AC
  Filled 2023-06-15: qty 2

## 2023-06-15 MED ORDER — OXYCODONE HCL 5 MG PO TABS
ORAL_TABLET | ORAL | Status: AC
  Filled 2023-06-15: qty 1

## 2023-06-15 MED ORDER — ONDANSETRON HCL 4 MG/2ML IJ SOLN
4.0000 mg | Freq: Four times a day (QID) | INTRAMUSCULAR | Status: DC | PRN
  Administered 2023-06-18: 4 mg via INTRAVENOUS
  Filled 2023-06-15: qty 2

## 2023-06-15 MED ORDER — ONDANSETRON 4 MG PO TBDP: 4.0000 mg | ORAL_TABLET | Freq: Four times a day (QID) | ORAL | Status: DC | PRN

## 2023-06-15 MED ORDER — PROMETHAZINE HCL 25 MG/ML IJ SOLN: 6.2500 mg | INTRAMUSCULAR | Status: DC | PRN

## 2023-06-15 MED ORDER — POTASSIUM CHLORIDE IN NACL 40-0.9 MEQ/L-% IV SOLN
INTRAVENOUS | Status: DC
  Filled 2023-06-15 (×9): qty 1000

## 2023-06-15 MED ORDER — OXYCODONE HCL 5 MG PO TABS
5.0000 mg | ORAL_TABLET | Freq: Once | ORAL | Status: AC | PRN
  Administered 2023-06-15: 5 mg via ORAL

## 2023-06-15 MED ORDER — DROPERIDOL 2.5 MG/ML IJ SOLN: 0.6250 mg | Freq: Once | INTRAMUSCULAR | Status: DC | PRN

## 2023-06-15 MED ORDER — DIPHENHYDRAMINE HCL 12.5 MG/5ML PO ELIX
12.5000 mg | ORAL_SOLUTION | Freq: Four times a day (QID) | ORAL | Status: DC | PRN
  Administered 2023-06-17: 12.5 mg via ORAL
  Filled 2023-06-15 (×2): qty 5

## 2023-06-15 MED ORDER — INSULIN ASPART 100 UNIT/ML IJ SOLN: 0.0000 [IU] | Freq: Every day | INTRAMUSCULAR | Status: DC

## 2023-06-15 NOTE — Plan of Care (Signed)
[name], VSS throughout shift.  All meds given on time as ordered.  Fluids on-going.  Diminished lungs, IS encouraged.  Pt c/o pain relieved by IV Toradol PRN.  Ambien given for sleep aide.  Purewick in place.  POC maintained, will continue to monitor.  Problem: Education: Goal: Knowledge of General Education information will improve Description: Including pain rating scale, medication(s)/side effects and non-pharmacologic comfort measures Outcome: Progressing   Problem: Health Behavior/Discharge Planning: Goal: Ability to manage health-related needs will improve Outcome: Progressing   Problem: Clinical Measurements: Goal: Ability to maintain clinical measurements within normal limits will improve Outcome: Progressing Goal: Will remain free from infection Outcome: Progressing Goal: Diagnostic test results will improve Outcome: Progressing Goal: Respiratory complications will improve Outcome: Progressing Goal: Cardiovascular complication will be avoided Outcome: Progressing   Problem: Activity: Goal: Risk for activity intolerance will decrease Outcome: Progressing   Problem: Nutrition: Goal: Adequate nutrition will be maintained Outcome: Progressing   Problem: Coping: Goal: Level of anxiety will decrease Outcome: Progressing   Problem: Elimination: Goal: Will not experience complications related to bowel motility Outcome: Progressing Goal: Will not experience complications related to urinary retention Outcome: Progressing   Problem: Pain Managment: Goal: General experience of comfort will improve Outcome: Progressing   Problem: Safety: Goal: Ability to remain free from injury will improve Outcome: Progressing   Problem: Skin Integrity: Goal: Risk for impaired skin integrity will decrease Outcome: Progressing

## 2023-06-15 NOTE — Transfer of Care (Signed)
Immediate Anesthesia Transfer of Care Note  Patient: Marilyn Weber  Procedure(s) Performed: Procedure(s): EXPLORATORY LAPAROTOMY (N/A) SMALL BOWEL RESECTION (N/A) REPAIR ABDOMINAL WALL HERNIA  Patient Location: PACU  Anesthesia Type:General  Level of Consciousness: sedated  Airway & Oxygen Therapy: Patient Spontanous Breathing and Patient connected to face mask oxygen  Post-op Assessment: Report given to RN and Post -op Vital signs reviewed and stable  Post vital signs: Reviewed and stable  Last Vitals:  Vitals:   06/14/23 2155 06/15/23 0008  BP:  125/65  Pulse:  80  Resp:  17  Temp: 36.6 C (!) 36.1 C  SpO2:  100%    Complications: No apparent anesthesia complications

## 2023-06-15 NOTE — Progress Notes (Signed)
Brief Progress Note Seen same morning of surgery  Somnolent but arouses Abdominal soreness; pain improved compared to pre-op No fever, chills, nausea, emesis  Exam:  BP 123/70 (BP Location: Right Arm)   Pulse 85   Temp 98.6 F (37 C) (Oral)   Resp 18   Ht 5\' 4"  (1.626 m)   Wt 81.6 kg   SpO2 94%   BMI 30.90 kg/m   Resting well; NAD Abdomen soft, incisional soreness, non-distended LLQ incision is CDI with staples and honeycomb, no drainage  Plan:  -- I think we can trial CLD with caution -- Continue IVF support -- Continue IV Abx (Cefotetan) -- Monitor abdominal examination; on-going bowel function -- Pain control prn; antiemetics prn -- Mobilize; low threshold to engage therapies  -- Lynden Oxford, PA-C [facility] 06/15/2023, 7:29 AM M-F: 7am - 4pm

## 2023-06-15 NOTE — Plan of Care (Signed)

## 2023-06-15 NOTE — Inpatient Diabetes Management (Signed)
Inpatient Diabetes Program Recommendations  AACE/ADA: New Consensus Statement on Inpatient Glycemic Control   Target Ranges:  Prepandial:   less than 140 mg/dL      Peak postprandial:   less than 180 mg/dL (1-2 hours)      Critically ill patients:  140 - 180 mg/dL    Latest Reference Range & Units 06/14/23 18:02 06/15/23 08:16  Glucose 70 - 99 mg/dL 829 (H) 562 (H)    Latest Reference Range & Units 06/15/23 00:10  Glucose-Capillary 70 - 99 mg/dL 130 (H)   Review of Glycemic Control  Diabetes history: DM2 Outpatient Diabetes medications: Lantus 15 units at bedtime, Glipizide 5 mg BID, Jardiance 25 mg daily Current orders for Inpatient glycemic control: None  Inpatient Diabetes Program Recommendations:    Insulin: Please consider ordering CBGs and Novolog 0-9 units TID with meals and Novolog 0-5 units at bedtime.  Thanks, Orlando Penner, RN, MSN, CDCES Diabetes Coordinator Inpatient Diabetes Program [phone] (Team Pager from 8am to 5pm)

## 2023-06-15 NOTE — Evaluation (Signed)
Physical Therapy Evaluation Patient Details Name: Marilyn Weber MRN: [id number] DOB: [birth year] Today's Date: 06/15/2023  History of Present Illness  presented to ER secondary to acute abdominal pain; admitted for management of strangulated hernia, s/p ex-lap, small-bowel resection (06/15/23)  Clinical Impression  Patient resting in bed upon arrival to room; alert and oriented, follows commands and agreeable to participation with session.  Endorses minimal pain, only mild discomfort with coughing.  Bilat UE/LE Weber and ROM grossly symmetrical and WFL; no focal weakness appreciated.  Able to complete bed mobility with min assist (log rolling); sit/stand, basic transfers and short-distance gait (5') with RW, cga.  Fair step height/length; frequent cuing for hand placement and optimal RW use.  Additional distance/activity deferred due to fatigue with initial OOB efforts (and abdominal binder not present); anticipate consistent progression of mobility distance with repetition/opportunity    Would benefit from skilled PT to address above deficits and promote optimal return to PLOF.; recommend post-acute PT follow up as indicated by interdisciplinary care team.     Of note, patient noted with order for abdominal binder (not present in room).  Unit secretary informed and to call supply chain for delivery to room.  Will don and educate on use prior to progressive gait/activity in subsequent sessions.     Recommendations for follow up therapy are one component of a multi-disciplinary discharge planning process, led by the attending physician.  Recommendations may be updated based on patient status, additional functional criteria and insurance authorization.  Follow Up Recommendations       Assistance Recommended at Discharge PRN  Patient can return home with the following  A little help with walking and/or transfers;A little help with bathing/dressing/bathroom    Equipment Recommendations Rolling  walker (2 wheels)  Recommendations for Other Services       Functional Status Assessment Patient has had a recent decline in their functional status and demonstrates the ability to make significant improvements in function in a reasonable and predictable amount of time.     Precautions / Restrictions Precautions Precautions: Fall Restrictions Weight Bearing Restrictions: No      Mobility  Bed Mobility Overal bed mobility: Modified Independent                  Transfers Overall transfer level: Needs assistance Equipment used: Rolling walker (2 wheels) Transfers: Sit to/from Stand, Bed to chair/wheelchair/BSC Sit to Stand: Min guard Stand pivot transfers: Min guard         General transfer comment: cuing for hand placement to prevent pulling on RW    Ambulation/Gait               General Gait Details: deferred due to fatigue with initial OOB efforts; anticipate consistent progression of mobility distance with repetition/opportunity  Stairs            Wheelchair Mobility    Modified Rankin (Stroke Patients Only)       Balance Overall balance assessment: Needs assistance Sitting-balance support: No upper extremity supported, Feet supported Sitting balance-Leahy Scale: Good     Standing balance support: Bilateral upper extremity supported Standing balance-Leahy Scale: Fair                               Pertinent Vitals/Pain Pain Assessment Pain Assessment: No/denies pain    Home Living Family/patient expects to be discharged to:: Private residence Living Arrangements: Alone Available Help at Discharge: Family Type of Home: Mobile  home Home Access: Stairs to enter   Entrance Stairs-Number of Steps: 3   Home Layout: One level Home Equipment: None      Prior Function Prior Level of Function : Independent/Modified Independent             Mobility Comments: Indep with ADLs, household and community mobilization;  intermittent use of SPC at times.  + Driving; indep completes community errands.  Does endorse single fall in previous six months       Hand Dominance        Extremity/Trunk Assessment   Upper Extremity Assessment Upper Extremity Assessment: Overall WFL for tasks assessed    Lower Extremity Assessment Lower Extremity Assessment: Overall WFL for tasks assessed (grossly 4/5 throughout; no focal weakness appreciated)       Communication   Communication: No difficulties  Cognition Arousal/Alertness: Awake/alert Behavior During Therapy: WFL for tasks assessed/performed Overall Cognitive Status: Within Functional Limits for tasks assessed                                          General Comments      Exercises Other Exercises Other Exercises: Toilet transfer, SPT With RW, cga/close sup.  Purewick removed (and not replaced) during session due to transfer ability and potential; CNA/RN informed/aware. Other Exercises: Grooming and oral care in supported sitting, set up/supervision.   Assessment/Plan    PT Assessment Patient needs continued PT services  PT Problem List Decreased Weber;Decreased activity tolerance;Decreased balance;Decreased mobility;Decreased knowledge of use of DME;Decreased safety awareness;Decreased knowledge of precautions       PT Treatment Interventions DME instruction;Gait training;Stair training;Functional mobility training;Therapeutic activities;Therapeutic exercise;Balance training;Patient/family education    PT Goals (Current goals can be found in the Care Plan section)  Acute Rehab PT Goals Patient Stated Goal: to return home PT Goal Formulation: With patient Time For Goal Achievement: 06/29/23 Potential to Achieve Goals: Good    Frequency Min 3X/week     Co-evaluation               AM-PAC PT "6 Clicks" Mobility  Outcome Measure Help needed turning from your back to your side while in a flat bed without using  bedrails?: None Help needed moving from lying on your back to sitting on the side of a flat bed without using bedrails?: A Little Help needed moving to and from a bed to a chair (including a wheelchair)?: A Little Help needed standing up from a chair using your arms (e.g., wheelchair or bedside chair)?: A Little Help needed to walk in hospital room?: A Little Help needed climbing 3-5 steps with a railing? : A Little 6 Click Score: 19    End of Session   Activity Tolerance: Patient tolerated treatment well Patient left: in chair;with call bell/phone within reach;with chair alarm set Nurse Communication: Mobility status PT Visit Diagnosis: Muscle weakness (generalized) (M62.81);Difficulty in walking, not elsewhere classified (R26.2)    Time: 0623-7628 PT Time Calculation (min) (ACUTE ONLY): 27 min   Charges:   PT Evaluation $PT Eval Low Complexity: 1 Low PT Treatments $Therapeutic Activity: 8-22 mins       Angeles Paolucci H. Manson Passey, PT, DPT, NCS 06/15/23, 11:09 AM [phone]

## 2023-06-15 NOTE — Progress Notes (Signed)
Mobility Specialist - Progress Note   06/15/23 1500  Mobility  Activity Refused mobility     Pt politely declined mobility; reports being up earlier today and wanting to rest at this time. Will attempt another date/time.    Filiberto Pinks Mobility Specialist 06/15/23, 3:38 PM

## 2023-06-15 NOTE — Op Note (Signed)
Small bowel resection with repair of 3.5 cm diameter incarcerated/strangulated spigelian hernia, left lower quadrant abdominal wall hernia.  Pre-operative Diagnosis: Strangulated left lower quadrant spigelian hernia  Post-operative Diagnosis: same.    Surgeon: Campbell Lerner, M.D., [facility]  Anesthesia: General endotracheal  Findings: As expected, fortunately, small bowel viability was clearly well-demarcated, approximately 1 foot of small bowel required resection.  There was a small short loop of transverse colon within the hernia sac which was clearly viable.  There was a large portion of infarcted omentum present as well.  Estimated Blood Loss: 100 mL         Specimens: Portion of small bowel, portion of omentum.          Complications: none              Procedure Details  The patient was seen again in the Holding Room. The benefits, complications, treatment options, and expected outcomes were discussed with the patient. The risks of bleeding, infection, recurrence of symptoms, failure to resolve symptoms, unanticipated injury, prosthetic placement, prosthetic infection, any of which could require further surgery were reviewed with the patient. The likelihood of improving the patient's symptoms with return to their baseline status is hopeful.  The patient and/or family concurred with the proposed plan, giving informed consent.  The patient was taken to Operating Room, identified and the procedure verified.    Prior to the induction of general anesthesia, antibiotic prophylaxis was administered. VTE prophylaxis was in place.  General anesthesia was then administered and tolerated well. After the induction, the patient was positioned in the supine position and the abdomen was prepped with Chloraprep and draped in the sterile fashion.  A Time Out was held and the above information confirmed.  A transverse incision is made over the obvious hernia mass in the left lower quadrant.  With  electrosurgery we proceeded through the subcutaneous tissues and encountered the rather attenuated external oblique fascia and musculature.  This was incised along the direction of its fibers, enabling [place] access to the hernia sac.  Hernia sac was then shelled out from this intramuscular location, the sac was opened, and the serosanguineous fluid was aspirated.  The true hernia defect was then appreciated, it was opened slightly toward the midline to allow mobilization of the incarcerated contents.  I was able to easily reduce a clearly viable portion of transverse colon.  However the omentum attached to it appeared to be nonviable.  The omentum was divided with large jawed LigaSure device.  Maintaining excellent hemostasis.  The small bowel was then mobilized and was able to identify clear transitions from nonviable small bowel to clearly viable small intestine.  I felt I was able to safely proceed with resection and reanastomosis via this left lower quadrant hernia defect. In usual manner the mesenteric windows were created both proximally and distally.  A 55 GIA stapler was utilized to divide the same.  The intervening mesentery was divided with the LigaSure maintaining excellent hemostasis.  A side-to-side anastomosis was created, with a third load of the blue 55 GIA cartridge, and the common enterotomy was then closed with a TA stapler.  3-0 silk sutures were utilized to reinforce the critical crossing staple lines and the apex of the anastomosis/common channel.  We were then able to reduce this within the abdominal cavity without difficulty.  We then irrigated the peritoneal cavity with multiple aliquots of sterile saline. Then proceeded with abdominal wall closure reconstructing the abdominal wall with 2 layers of running 2-0  PDS, closing the defect and reconstructing the left lower quadrant abdominal wall without significant tension. Irrigated subcutaneous tissues and approximated the dermis with some  interrupted 3-0 Vicryl.  The skin was then approximated with staples.  Honeycomb dressing applied. Patient appeared to tolerate the procedure well, she was subsequently extubated and transferred to PACU in stable condition.    Campbell Lerner M.D., [facility] 06/15/2023 12:04 AM

## 2023-06-15 NOTE — TOC Initial Note (Addendum)
Transition of Care [facility]) - Initial/Assessment Note    Patient Details  Name: Marilyn Weber MRN: [id number] Date of Birth: 06-22-1946  Transition of Care [facility]) CM/SW Contact:    Kreg Shropshire, RN Phone Number: 06/15/2023, 4:36 PM  Clinical Narrative:                 Cm assessed pt for TOC needs. She lives home alone in [place], [place]. Her son or daughter will be able to pick her up from the hospital when d/c. No DME at home. She has rec for home health PT. Cm sent secure messages to [place], [place], [place], [place], [place], [place], [place], and [employer] Waiting to hear back from [facility] agencies that serve [employer].  Expected Discharge Plan: Home w Home Health Services Barriers to Discharge: Continued Medical Work up   Patient Goals and CMS Choice            Expected Discharge Plan and Services       Living arrangements for the past 2 months: Single Family Home                                      Prior Living Arrangements/Services Living arrangements for the past 2 months: Single Family Home Lives with:: Self Patient language and need for interpreter reviewed:: Yes                 Activities of Daily Living Home Assistive Devices/Equipment: CBG Meter ADL Screening (condition at time of admission) Patient's cognitive ability adequate to safely complete daily activities?: Yes Is the patient deaf or have difficulty hearing?: No Does the patient have difficulty seeing, even when wearing glasses/contacts?: No Does the patient have difficulty concentrating, remembering, or making decisions?: No Patient able to express need for assistance with ADLs?: Yes Does the patient have difficulty dressing or bathing?: No Independently performs ADLs?: Yes (appropriate for developmental age) Does the patient have difficulty walking or climbing stairs?: Yes Weakness of Legs: Both Weakness of Arms/Hands: Both  Permission Sought/Granted                   Emotional Assessment Appearance:: Appears stated age Attitude/Demeanor/Rapport: Engaged Affect (typically observed): Calm Orientation: : Oriented to Self, Oriented to Place, Oriented to Situation, Oriented to  Time      Admission diagnosis:  Hypokalemia [E87.6] Incarcerated hernia [K46.0] Strangulated hernia of abdominal wall [K43.6] Patient Active Problem List   Diagnosis Date Noted   Strangulated hernia of abdominal wall 06/14/2023   Schizophrenia (HCC) 05/02/2020   Delusion (HCC) 05/02/2020   DM (diabetes mellitus) type 2, uncontrolled, with ketoacidosis (HCC) 05/02/2020   Essential hypertension 05/02/2020   Hyperthyroidism 05/02/2020   Restless leg syndrome 05/02/2020   Hereditary and idiopathic peripheral neuropathy 05/02/2020   Chronic pain syndrome 05/02/2020   Paranoia (HCC) 04/30/2020   Auditory hallucinations 04/30/2020   PCP:  [facility]:   [facility]) - [place], [place] - [address] [place] [address] Phone: [phone] Fax: [phone]  [facility] - [place], [place] - [address] [place] [address] Phone: [phone] Fax: [phone]     Social Determinants of Health (SDOH) Social History: SDOH Screenings   Food Insecurity: No Food Insecurity (06/15/2023)  Housing: Low Risk  (06/15/2023)  Transportation Needs: No Transportation Needs (  06/15/2023)  Utilities: Not At Risk (06/15/2023)  Alcohol Screen: Low Risk  (05/01/2020)  Tobacco Use: Low Risk  (06/15/2023)   SDOH Interventions:     Readmission Risk Interventions     No data to display

## 2023-06-16 LAB — BASIC METABOLIC PANEL
Anion gap: 7 (ref 5–15)
BUN: 13 mg/dL (ref 8–23)
CO2: 24 mmol/L (ref 22–32)
Calcium: 8.1 mg/dL — ABNORMAL LOW (ref 8.9–10.3)
Chloride: 108 mmol/L (ref 98–111)
Creatinine, Ser: 0.74 mg/dL (ref 0.44–1.00)
GFR, Estimated: >60 mL/min (ref >60)
Glucose, Bld: 102 mg/dL — ABNORMAL HIGH (ref 70–99)
Potassium: 3.4 mmol/L — ABNORMAL LOW (ref 3.5–5.1)
Sodium: 139 mmol/L (ref 135–145)

## 2023-06-16 LAB — CBC
HCT: 36.3 % (ref 36.0–46.0)
Hemoglobin: 11.5 g/dL — ABNORMAL LOW (ref 12.0–15.0)
MCH: 25.6 pg — ABNORMAL LOW (ref 26.0–34.0)
MCHC: 31.7 g/dL (ref 30.0–36.0)
MCV: 80.8 fL (ref 80.0–100.0)
Platelets: 268 10*3/uL (ref 150–400)
RBC: 4.49 MIL/uL (ref 3.87–5.11)
RDW: 15.8 % — ABNORMAL HIGH (ref 11.5–15.5)
WBC: 15.4 10*3/uL — ABNORMAL HIGH (ref 4.0–10.5)
nRBC: 0 % (ref 0.0–0.2)

## 2023-06-16 LAB — HEMOGLOBIN A1C
Hgb A1c MFr Bld: 5.8 % — ABNORMAL HIGH (ref 4.8–5.6)
Mean Plasma Glucose: 120 mg/dL

## 2023-06-16 LAB — GLUCOSE, CAPILLARY
Glucose-Capillary: 98 mg/dL (ref 70–99)
Glucose-Capillary: 144 mg/dL — ABNORMAL HIGH (ref 70–99)
Glucose-Capillary: 112 mg/dL — ABNORMAL HIGH (ref 70–99)
Glucose-Capillary: 126 mg/dL — ABNORMAL HIGH (ref 70–99)

## 2023-06-16 NOTE — Care Management Important Message (Signed)
Important Message  Patient Details  Name: Marilyn Weber MRN: [id number] Date of Birth: 01-07-1946   Medicare Important Message Given:  Yes  Initial consent signed by patient for Medicare IM.  Copy left with patient for reference, original to be scanned into chart.   Johnell Comings 06/16/2023, 9:47 AM

## 2023-06-16 NOTE — Progress Notes (Signed)
[facility] SURGICAL PROGRESS NOTE  Hospital Day(s): 2.   Post op day(s): 2 Days Post-Op.   Interval History:  Patient seen and examined No acute events or new complaints overnight.  Patient reports she is having LLQ pain; at incision No fever, chills, nausea, emesis Leukocytosis is improving; 15.4K (from 16.0K) Hgb to 11.5 Renal function normal; sCr - 0.74; UO - unmeasured Hypokalemia to 3.4 CLD; tolerating some No flatus Ambulating to restroom   Vital signs in last 24 hours: [min-max] current  Temp:  [98.3 F (36.8 C)-98.6 F (37 C)] 98.3 F (36.8 C) (06/28 0348) Pulse Rate:  [79-85] 79 (06/28 0348) Resp:  [16-20] 20 (06/28 0348) BP: (119-123)/(68-72) 119/68 (06/28 0348) SpO2:  [94 %-99 %] 99 % (06/28 0348)     Height: 5\' 4"  (162.6 cm) Weight: 81.6 kg BMI (Calculated): 30.88   Intake/Output last 2 shifts:  06/27 0701 - 06/28 0700 In: 1913.3 [P.O.:1140; I.V.:773.3] Out: -    Physical Exam:  Constitutional: alert, cooperative and no distress  Respiratory: breathing non-labored at rest  Cardiovascular: regular rate and sinus rhythm  Gastrointestinal: soft, incisional soreness in LLQ, non-distended, no rebound/guarding Integumentary: Incision in LLQ is CDI with staples and honeycomb, no erythema, no drainage   Labs:     Latest Ref Rng & Units 06/16/2023    4:34 AM 06/14/2023    6:02 PM 02/16/2021    2:55 PM  CBC  WBC 4.0 - 10.5 K/uL 15.4  16.0  9.4   Hemoglobin 12.0 - 15.0 g/dL 16.1  09.6  04.5   Hematocrit 36.0 - 46.0 % 36.3  45.4  41.1   Platelets 150 - 400 K/uL 268  309  300       Latest Ref Rng & Units 06/16/2023    4:34 AM 06/15/2023    8:16 AM 06/14/2023    6:02 PM  CMP  Glucose 70 - 99 mg/dL 409  811  914   BUN 8 - 23 mg/dL 13  15  14    Creatinine 0.44 - 1.00 mg/dL 7.82  9.56  2.13   Sodium 135 - 145 mmol/L 139  139  139   Potassium 3.5 - 5.1 mmol/L 3.4  3.3  2.2   Chloride 98 - 111 mmol/L 108  109  104   CO2 22 - 32 mmol/L 24  25  22     Calcium 8.9 - 10.3 mg/dL 8.1  8.2  9.0   Total Protein 6.5 - 8.1 g/dL   7.8   Total Bilirubin 0.3 - 1.2 mg/dL   0.8   Alkaline Phos 38 - 126 U/L   77   AST 15 - 41 U/L   40   ALT 0 - 44 U/L   30     Imaging studies: No new pertinent imaging studies   Assessment/Plan:  77 y.o. female 2 Days Post-Op s/p small bowel resection and repair of left strangulated spigelian hernia   - Continue CLD for now until better ROBF - Continue IVF support - Monitor abdominal examination; on-going bowel function - Continue abdominal binder - Pain control prn; antiemetics prn - Monitor leukocytosis; improving - Mobilize; PT following   All of the above findings and recommendations were discussed with the patient, and the medical team, and all of patient's questions were answered to her expressed satisfaction.  -- Lynden Oxford, PA-C [facility] 06/16/2023, 7:42 AM M-F: 7am - 4pm

## 2023-06-16 NOTE — Progress Notes (Signed)
Patient is alert and oriented x4. Disposition will be home w/home health. Patient received norco last night for left back pain. She also received prn ambien for sleep per request. Blood glucose was 138 for last night. No additional requests made.

## 2023-06-16 NOTE — Plan of Care (Signed)
  Problem: Activity: Goal: Risk for activity intolerance will decrease Outcome: Progressing   Problem: Nutrition: Goal: Adequate nutrition will be maintained Outcome: Progressing   Problem: Pain Managment: Goal: General experience of comfort will improve Outcome: Progressing   Problem: Safety: Goal: Ability to remain free from injury will improve Outcome: Progressing   Problem: Skin Integrity: Goal: Risk for impaired skin integrity will decrease Outcome: Progressing   

## 2023-06-16 NOTE — TOC Progression Note (Signed)
Transition of Care [facility]) - Progression Note    Patient Details  Name: HOLLE SHOVER MRN: [id number] Date of Birth: 1946-05-04  Transition of Care [facility]) CM/SW Contact  Kreg Shropshire, RN Phone Number: 06/16/2023, 1:28 PM  Clinical Narrative:    89- PT recommends PT. Cm sent secure message to [facility] to ask for PT services in the [place] area. Kandee Keen agreed to take pt for PT services.    Expected Discharge Plan: Home w Home Health Services Barriers to Discharge: Continued Medical Work up  Expected Discharge Plan and Services       Living arrangements for the past 2 months: Single Family Home                           HH Arranged: PT, OT [facility] Agency: [facility] Date [facility] Agency Contacted: 06/16/23 Time HH Agency Contacted: 1327 Representative spoke with at [facility] Agency: [name]   Social Determinants of Health (SDOH) Interventions SDOH Screenings   Food Insecurity: No Food Insecurity (06/15/2023)  Housing: Low Risk  (06/15/2023)  Transportation Needs: No Transportation Needs (06/15/2023)  Utilities: Not At Risk (06/15/2023)  Alcohol Screen: Low Risk  (05/01/2020)  Tobacco Use: Low Risk  (06/15/2023)    Readmission Risk Interventions     No data to display

## 2023-06-17 ENCOUNTER — Inpatient Hospital Stay: Payer: 59

## 2023-06-17 LAB — BASIC METABOLIC PANEL
Anion gap: 3 — ABNORMAL LOW (ref 5–15)
BUN: 7 mg/dL — ABNORMAL LOW (ref 8–23)
CO2: 24 mmol/L (ref 22–32)
Calcium: 7.8 mg/dL — ABNORMAL LOW (ref 8.9–10.3)
Chloride: 111 mmol/L (ref 98–111)
Creatinine, Ser: 0.66 mg/dL (ref 0.44–1.00)
GFR, Estimated: >60 mL/min (ref >60)
Glucose, Bld: 107 mg/dL — ABNORMAL HIGH (ref 70–99)
Potassium: 3.7 mmol/L (ref 3.5–5.1)
Sodium: 138 mmol/L (ref 135–145)

## 2023-06-17 LAB — CBC
HCT: 34.7 % — ABNORMAL LOW (ref 36.0–46.0)
Hemoglobin: 11.2 g/dL — ABNORMAL LOW (ref 12.0–15.0)
MCH: 25.9 pg — ABNORMAL LOW (ref 26.0–34.0)
MCHC: 32.3 g/dL (ref 30.0–36.0)
MCV: 80.3 fL (ref 80.0–100.0)
Platelets: 240 10*3/uL (ref 150–400)
RBC: 4.32 MIL/uL (ref 3.87–5.11)
RDW: 15.7 % — ABNORMAL HIGH (ref 11.5–15.5)
WBC: 12.6 10*3/uL — ABNORMAL HIGH (ref 4.0–10.5)
nRBC: 0 % (ref 0.0–0.2)

## 2023-06-17 LAB — MAGNESIUM: Magnesium: 1.7 mg/dL (ref 1.7–2.4)

## 2023-06-17 LAB — GLUCOSE, CAPILLARY
Glucose-Capillary: 95 mg/dL (ref 70–99)
Glucose-Capillary: 178 mg/dL — ABNORMAL HIGH (ref 70–99)
Glucose-Capillary: 106 mg/dL — ABNORMAL HIGH (ref 70–99)
Glucose-Capillary: 137 mg/dL — ABNORMAL HIGH (ref 70–99)

## 2023-06-17 MED ORDER — OXYCODONE HCL 5 MG PO TABS
5.0000 mg | ORAL_TABLET | Freq: Four times a day (QID) | ORAL | Status: DC | PRN
  Administered 2023-06-18 – 2023-06-19 (×3): 5 mg via ORAL
  Filled 2023-06-17 (×3): qty 1

## 2023-06-17 MED ORDER — ARIPIPRAZOLE 10 MG PO TABS
5.0000 mg | ORAL_TABLET | Freq: Every day | ORAL | Status: DC
  Administered 2023-06-17 – 2023-06-20 (×4): 5 mg via ORAL
  Filled 2023-06-17 (×5): qty 1

## 2023-06-17 MED ORDER — ACETAMINOPHEN 500 MG PO TABS
1000.0000 mg | ORAL_TABLET | Freq: Four times a day (QID) | ORAL | Status: DC
  Administered 2023-06-17 – 2023-06-20 (×10): 1000 mg via ORAL
  Filled 2023-06-17 (×12): qty 2

## 2023-06-17 NOTE — Progress Notes (Signed)
Mobility Specialist - Progress Note    06/17/23 1559  Mobility  Activity Ambulated with assistance to bathroom;Stood at bedside  Level of Assistance Standby assist, set-up cues, supervision of patient - no hands on  Assistive Device None  Distance Ambulated (ft) 10 ft  Range of Motion/Exercises Active  Activity Response Tolerated well  Mobility Referral Yes  $Mobility charge 1 Mobility  Mobility Specialist Start Time (ACUTE ONLY) 1534  Mobility Specialist Stop Time (ACUTE ONLY) 1543  Mobility Specialist Time Calculation (min) (ACUTE ONLY) 9 min   Pt resting in bed on RA upon entry. Pt STS and ambulates to bathroom SBA with no AD. Pt returned to bed and left with needs in reach; bed alarm activated.   Marilyn Weber Mobility Specialist 06/17/23, 4:06 PM

## 2023-06-17 NOTE — Progress Notes (Signed)
POD # 3 SB resection and hernia repair Subjective: Taking clears.  No flatus Labs ok  Objective: Vital signs in last 24 hours: Temp:  [98.2 F (36.8 C)-99.1 F (37.3 C)] 98.3 F (36.8 C) (06/29 0741) Pulse Rate:  [68-87] 68 (06/29 0741) Resp:  [16-18] 18 (06/29 0741) BP: (128-154)/(65-81) 128/65 (06/29 0741) SpO2:  [96 %-100 %] 98 % (06/29 0741) Last BM Date : 06/12/23  Intake/Output from previous day: 06/28 0701 - 06/29 0700 In: 4044.6 [P.O.:720; I.V.:3324.6] Out: -  Intake/Output this shift: Total I/O In: 699.4 [I.V.:699.4] Out: -   Physical exam: NAD , chronically ill Abd: soft, mildly distended, no infection opr peritonitis.   Lab Results: CBC  Recent Labs    06/16/23 0434 06/17/23 0532  WBC 15.4* 12.6*  HGB 11.5* 11.2*  HCT 36.3 34.7*  PLT 268 240   BMET Recent Labs    06/16/23 0434 06/17/23 0532  NA 139 138  K 3.4* 3.7  CL 108 111  CO2 24 24  GLUCOSE 102* 107*  BUN 13 7*  CREATININE 0.74 0.66  CALCIUM 8.1* 7.8*   PT/INR No results for input(s): "LABPROT", "INR" in the last 72 hours. ABG No results for input(s): "PHART", "HCO3" in the last 72 hours.  Invalid input(s): "PCO2", "PO2"  Studies/Results: DG ABD ACUTE 2+V W 1V CHEST  Result Date: 06/17/2023 CLINICAL DATA:  77 year old female with history of acute abdominal pain. Ileus. EXAM: DG ABDOMEN ACUTE WITH 1 VIEW CHEST COMPARISON:  Chest x-ray 10/13/2019. CT of the abdomen and pelvis 06/14/2023. FINDINGS: Linear perihilar opacity suggesting subsegmental atelectasis in the inferior right upper lobe or superior segment of the right lower lobe. Lung volumes are normal. No consolidative airspace disease. No pleural effusions. No pneumothorax. No pulmonary nodule or mass noted. Pulmonary vasculature and the cardiomediastinal silhouette are within normal limits. Dedicated views of the abdomen demonstrate multiple air-fluid levels throughout the small bowel and colon. Several prominent mildly dilated  loops of small bowel are noted measuring up to 3.2 cm in diameter. Distal rectal gas is noted. No definite pneumoperitoneum. Surgical clips project over the right upper quadrant of the abdomen, compatible with prior cholecystectomy. Numerous surgical clips are also noted in the low anatomic pelvis. Staple line projecting over the left lower abdominal wall. IMPRESSION: 1. Bowel-gas pattern is most suggestive of ileus, as above. 2. Postoperative changes, as above. 3. Probable subsegmental atelectasis or scarring in the right mid lung, as above. Attention on follow-up studies is recommended to ensure stability or resolution. Electronically Signed   By: Trudie Reed M.D.   On: 06/17/2023 09:45    Anti-infectives: Anti-infectives (From admission, onward)    Start     Dose/Rate Route Frequency Ordered Stop   06/15/23 1000  cefoTEtan (CEFOTAN) 2 g in sodium chloride 0.9 % 100 mL IVPB        2 g 200 mL/hr over 30 Minutes Intravenous Every 12 hours 06/15/23 0215 06/16/23 1337   06/14/23 2115  cefoTEtan (CEFOTAN) 2 g in sodium chloride 0.9 % 100 mL IVPB  Status:  Discontinued        2 g 200 mL/hr over 30 Minutes Intravenous Every 12 hours 06/14/23 2114 06/15/23 6045       Assessment/Plan: Ileus keep liquids Mobilize PT consult Resume antipsychotics Decrease ivf  Sterling Big, MD, FACS  06/17/2023

## 2023-06-17 NOTE — Plan of Care (Signed)
  Problem: Nutrition: Goal: Adequate nutrition will be maintained Outcome: Progressing   Problem: Elimination: Goal: Will not experience complications related to bowel motility Outcome: Progressing   Problem: Pain Managment: Goal: General experience of comfort will improve Outcome: Progressing   

## 2023-06-17 NOTE — Progress Notes (Signed)
Physical Therapy Treatment Patient Details Name: Marilyn Weber MRN: [id number] DOB: Aug 09, 1946 Today's Date: 06/17/2023   History of Present Illness presented to ER secondary to acute abdominal pain; admitted for management of strangulated hernia, s/p ex-lap, small-bowel resection (06/15/23)    PT Comments    Pt was awake, watching TV upon arrival. She is A and O x 4 but does require some encouragement to participate. Pt also requires assistance to exit bed, stand and ambulate. She endorses living alone and not having anyone who can stay with her at DC. Author discussed recommendation for use of RW at all times and encouraged pt to attempt ascending/descending stairs to simulate home environment. Pt was unwilling due to pain/ fatigue. Author does have some concerns with pt Dcing home alone however pt is unwilling to DC to STR. Acute PT will continue to follow and progress per current POC.     Recommendations for follow up therapy are one component of a multi-disciplinary discharge planning process, led by the attending physician.  Recommendations may be updated based on patient status, additional functional criteria and insurance authorization.     Assistance Recommended at Discharge Set up Supervision/Assistance  Patient can return home with the following A little help with walking and/or transfers;A little help with bathing/dressing/bathroom   Equipment Recommendations  Other (comment) (Pt endorses having a RW at home but will need to confirm)       Precautions / Restrictions Precautions Precautions: Fall Restrictions Weight Bearing Restrictions: No     Mobility  Bed Mobility Overal bed mobility: Needs Assistance Bed Mobility: Supine to Sit, Sit to Supine  Supine to sit: Min assist Sit to supine: Min assist    Transfers Overall transfer level: Needs assistance Equipment used: 1 person hand held assist (+ IV pole) Transfers: Sit to/from Stand Sit to Stand: Min guard  General  transfer comment: pt wanted to try to perform session without AD however throughout session, author recommends use of RW at DC at all times.    Ambulation/Gait Ambulation/Gait assistance: Min guard, Min assist Gait Distance (Feet): 100 Feet Assistive device: 1 person hand held assist, IV Pole Gait Pattern/deviations: Step-through pattern Gait velocity: decreased  General Gait Details: Pt was able to ambulate ~ 1090 ft total but very slow, cautious, flexed gait. She is able to correct posture however gait remained very guarded. Highly recommend use of RW at all times until strength/balance improves.   Stairs Stairs: Yes  General stair comments: Pt was encouraged to attempt stairs to simulate home entry but she requetsed not to due to fatigue/pain    Balance Overall balance assessment: Needs assistance Sitting-balance support: No upper extremity supported, Feet supported Sitting balance-Leahy Scale: Good  Standing balance support: Bilateral upper extremity supported, During functional activity Standing balance-Leahy Scale: Fair Standing balance comment: Some unsteadiness observed even with +1 HHA + IV pole      Cognition Arousal/Alertness: Awake/alert Behavior During Therapy: WFL for tasks assessed/performed Overall Cognitive Status: Within Functional Limits for tasks assessed           Pertinent Vitals/Pain Pain Assessment Pain Assessment: 0-10 Pain Score: 2  Pain Location: L side/abdominal Pain Descriptors / Indicators: Discomfort Pain Intervention(s): Limited activity within patient's tolerance, Monitored during session, Premedicated before session, Repositioned     PT Goals (current goals can now be found in the care plan section) Acute Rehab PT Goals Patient Stated Goal: to return home Progress towards PT goals: Progressing toward goals    Frequency  Min 3X/week      PT Plan Current plan remains appropriate       AM-PAC PT "6 Clicks" Mobility   Outcome  Measure  Help needed turning from your back to your side while in a flat bed without using bedrails?: None Help needed moving from lying on your back to sitting on the side of a flat bed without using bedrails?: A Little Help needed moving to and from a bed to a chair (including a wheelchair)?: A Little Help needed standing up from a chair using your arms (e.g., wheelchair or bedside chair)?: A Little Help needed to walk in hospital room?: A Little Help needed climbing 3-5 steps with a railing? : A Lot 6 Click Score: 18    End of Session   Activity Tolerance: Patient limited by fatigue;Patient limited by pain Patient left: in bed;with call bell/phone within reach;with bed alarm set Nurse Communication: Mobility status PT Visit Diagnosis: Muscle weakness (generalized) (M62.81);Difficulty in walking, not elsewhere classified (R26.2)     Time: 1610-9604 PT Time Calculation (min) (ACUTE ONLY): 16 min  Charges:  $Gait Training: 8-22 mins                     Jetta Lout PTA 06/17/23, 2:52 PM

## 2023-06-18 LAB — GLUCOSE, CAPILLARY
Glucose-Capillary: 99 mg/dL (ref 70–99)
Glucose-Capillary: 173 mg/dL — ABNORMAL HIGH (ref 70–99)
Glucose-Capillary: 102 mg/dL — ABNORMAL HIGH (ref 70–99)
Glucose-Capillary: 134 mg/dL — ABNORMAL HIGH (ref 70–99)

## 2023-06-18 NOTE — Progress Notes (Signed)
Physical Therapy Treatment Patient Details Name: Marilyn Weber MRN: [id number] DOB: 1946/02/05 Today's Date: 06/18/2023   History of Present Illness presented to ER secondary to acute abdominal pain; admitted for management of strangulated hernia, s/p ex-lap, small-bowel resection (06/15/23)    PT Comments    OOB, to bathroom and completes x 1 lap on unit with RW and min guard/supervision.  She does need some cues for hand placements  she sated she has a rollator at home but prefers RW.  Would like a BSC to place over toilet at home for safer and easier standing.  Will relay to TOC.   Recommendations for follow up therapy are one component of a multi-disciplinary discharge planning process, led by the attending physician.  Recommendations may be updated based on patient status, additional functional criteria and insurance authorization.  Follow Up Recommendations       Assistance Recommended at Discharge    Patient can return home with the following     Equipment Recommendations  Rolling walker (2 wheels);BSC/3in1    Recommendations for Other Services       Precautions / Restrictions Precautions Precautions: Fall Restrictions Weight Bearing Restrictions: No     Mobility  Bed Mobility Overal bed mobility: Modified Independent               Patient Response: Cooperative  Transfers Overall transfer level: Needs assistance Equipment used: Rolling walker (2 wheels) Transfers: Sit to/from Stand Sit to Stand: Supervision           General transfer comment: cues for hand placements    Ambulation/Gait Ambulation/Gait assistance: Min guard, Supervision Gait Distance (Feet): 160 Feet Assistive device: Rolling walker (2 wheels) Gait Pattern/deviations: Step-through pattern, Decreased step length - right, Decreased step length - left Gait velocity: decreased     General Gait Details: improved with RW and pt feels more comfortabe with walker   Stairs              Wheelchair Mobility    Modified Rankin (Stroke Patients Only)       Balance Overall balance assessment: Mild deficits observed, not formally tested Sitting-balance support: Feet supported Sitting balance-Leahy Scale: Good     Standing balance support: Bilateral upper extremity supported, During functional activity Standing balance-Leahy Scale: Good                              Cognition Arousal/Alertness: Awake/alert Behavior During Therapy: WFL for tasks assessed/performed Overall Cognitive Status: Within Functional Limits for tasks assessed                                          Exercises Other Exercises Other Exercises: to bathroom to void and small BM    General Comments        Pertinent Vitals/Pain Pain Assessment Pain Assessment: No/denies pain    Home Living                          Prior Function            PT Goals (current goals can now be found in the care plan section) Progress towards PT goals: Progressing toward goals    Frequency    Min 3X/week      PT Plan Current plan remains appropriate    Co-evaluation  AM-PAC PT "6 Clicks" Mobility   Outcome Measure  Help needed turning from your back to your side while in a flat bed without using bedrails?: None Help needed moving from lying on your back to sitting on the side of a flat bed without using bedrails?: None Help needed moving to and from a bed to a chair (including a wheelchair)?: A Little Help needed standing up from a chair using your arms (e.g., wheelchair or bedside chair)?: A Little Help needed to walk in hospital room?: A Little Help needed climbing 3-5 steps with a railing? : A Little 6 Click Score: 20    End of Session Equipment Utilized During Treatment: Gait belt;Back brace Activity Tolerance: Patient tolerated treatment well Patient left: in chair;with call bell/phone within reach;with chair alarm  set Nurse Communication: Mobility status PT Visit Diagnosis: Muscle weakness (generalized) (M62.81);Difficulty in walking, not elsewhere classified (R26.2)     Time: 1610-9604 PT Time Calculation (min) (ACUTE ONLY): 20 min  Charges:  $Gait Training: 8-22 mins                    Danielle Dess, PTA 06/18/23, 9:27 AM

## 2023-06-18 NOTE — Progress Notes (Signed)
CC: POD # 4 SB resection and hernia repair  Subjective: Some nausea  No abd pain  Objective: Vital signs in last 24 hours: Temp:  [98.3 F (36.8 C)-99.3 F (37.4 C)] 98.7 F (37.1 C) (06/30 0829) Pulse Rate:  [69-89] 71 (06/30 0829) Resp:  [16-18] 16 (06/30 0829) BP: (142-167)/(64-85) 145/73 (06/30 0829) SpO2:  [99 %-100 %] 99 % (06/30 0829) Last BM Date : 06/18/23  Intake/Output from previous day: 06/29 0701 - 06/30 0700 In: 1418.5 [P.O.:480; I.V.:938.5] Out: 1 [Urine:1] Intake/Output this shift: No intake/output data recorded.  Physical exam:  Physical exam: NAD , chronically ill Abd: soft, mildly distended, no infection opr peritonitis. Wound healing well staples in place  Lab Results: CBC  Recent Labs    06/16/23 0434 06/17/23 0532  WBC 15.4* 12.6*  HGB 11.5* 11.2*  HCT 36.3 34.7*  PLT 268 240   BMET Recent Labs    06/16/23 0434 06/17/23 0532  NA 139 138  K 3.4* 3.7  CL 108 111  CO2 24 24  GLUCOSE 102* 107*  BUN 13 7*  CREATININE 0.74 0.66  CALCIUM 8.1* 7.8*   PT/INR No results for input(s): "LABPROT", "INR" in the last 72 hours. ABG No results for input(s): "PHART", "HCO3" in the last 72 hours.  Invalid input(s): "PCO2", "PO2"  Studies/Results: DG ABD ACUTE 2+V W 1V CHEST  Result Date: 06/17/2023 CLINICAL DATA:  77 year old female with history of acute abdominal pain. Ileus. EXAM: DG ABDOMEN ACUTE WITH 1 VIEW CHEST COMPARISON:  Chest x-ray 10/13/2019. CT of the abdomen and pelvis 06/14/2023. FINDINGS: Linear perihilar opacity suggesting subsegmental atelectasis in the inferior right upper lobe or superior segment of the right lower lobe. Lung volumes are normal. No consolidative airspace disease. No pleural effusions. No pneumothorax. No pulmonary nodule or mass noted. Pulmonary vasculature and the cardiomediastinal silhouette are within normal limits. Dedicated views of the abdomen demonstrate multiple air-fluid levels throughout the small bowel  and colon. Several prominent mildly dilated loops of small bowel are noted measuring up to 3.2 cm in diameter. Distal rectal gas is noted. No definite pneumoperitoneum. Surgical clips project over the right upper quadrant of the abdomen, compatible with prior cholecystectomy. Numerous surgical clips are also noted in the low anatomic pelvis. Staple line projecting over the left lower abdominal wall. IMPRESSION: 1. Bowel-gas pattern is most suggestive of ileus, as above. 2. Postoperative changes, as above. 3. Probable subsegmental atelectasis or scarring in the right mid lung, as above. Attention on follow-up studies is recommended to ensure stability or resolution. Electronically Signed   By: Trudie Reed M.D.   On: 06/17/2023 09:45    Anti-infectives: Anti-infectives (From admission, onward)    Start     Dose/Rate Route Frequency Ordered Stop   06/15/23 1000  cefoTEtan (CEFOTAN) 2 g in sodium chloride 0.9 % 100 mL IVPB        2 g 200 mL/hr over 30 Minutes Intravenous Every 12 hours 06/15/23 0215 06/16/23 1337   06/14/23 2115  cefoTEtan (CEFOTAN) 2 g in sodium chloride 0.9 % 100 mL IVPB  Status:  Discontinued        2 g 200 mL/hr over 30 Minutes Intravenous Every 12 hours 06/14/23 2114 06/15/23 0218       Assessment/Plan:  Ileus after SB resection and ventral H repair Keep CLD KUB and labs in am Mobilize No need for surgical intervention  Sterling Big, MD, FACS  06/18/2023

## 2023-06-19 ENCOUNTER — Inpatient Hospital Stay: Payer: 59

## 2023-06-19 LAB — COMPREHENSIVE METABOLIC PANEL
ALT: 31 U/L (ref 0–44)
AST: 38 U/L (ref 15–41)
Albumin: 2.6 g/dL — ABNORMAL LOW (ref 3.5–5.0)
Alkaline Phosphatase: 77 U/L (ref 38–126)
Anion gap: 8 (ref 5–15)
BUN: 6 mg/dL — ABNORMAL LOW (ref 8–23)
CO2: 26 mmol/L (ref 22–32)
Calcium: 8.2 mg/dL — ABNORMAL LOW (ref 8.9–10.3)
Chloride: 101 mmol/L (ref 98–111)
Creatinine, Ser: 0.61 mg/dL (ref 0.44–1.00)
GFR, Estimated: >60 mL/min (ref >60)
Glucose, Bld: 119 mg/dL — ABNORMAL HIGH (ref 70–99)
Potassium: 3.4 mmol/L — ABNORMAL LOW (ref 3.5–5.1)
Sodium: 135 mmol/L (ref 135–145)
Total Bilirubin: 0.7 mg/dL (ref 0.3–1.2)
Total Protein: 6.1 g/dL — ABNORMAL LOW (ref 6.5–8.1)

## 2023-06-19 LAB — GLUCOSE, CAPILLARY
Glucose-Capillary: 97 mg/dL (ref 70–99)
Glucose-Capillary: 189 mg/dL — ABNORMAL HIGH (ref 70–99)
Glucose-Capillary: 145 mg/dL — ABNORMAL HIGH (ref 70–99)
Glucose-Capillary: 167 mg/dL — ABNORMAL HIGH (ref 70–99)

## 2023-06-19 LAB — MAGNESIUM: Magnesium: 1.6 mg/dL — ABNORMAL LOW (ref 1.7–2.4)

## 2023-06-19 LAB — CBC
HCT: 36.6 % (ref 36.0–46.0)
Hemoglobin: 12 g/dL (ref 12.0–15.0)
MCH: 25.6 pg — ABNORMAL LOW (ref 26.0–34.0)
MCHC: 32.8 g/dL (ref 30.0–36.0)
MCV: 78 fL — ABNORMAL LOW (ref 80.0–100.0)
Platelets: 307 10*3/uL (ref 150–400)
RBC: 4.69 MIL/uL (ref 3.87–5.11)
RDW: 15.3 % (ref 11.5–15.5)
WBC: 9.7 10*3/uL (ref 4.0–10.5)
nRBC: 0 % (ref 0.0–0.2)

## 2023-06-19 MED ORDER — POTASSIUM CHLORIDE CRYS ER 20 MEQ PO TBCR
40.0000 meq | EXTENDED_RELEASE_TABLET | Freq: Once | ORAL | Status: AC
  Administered 2023-06-19: 40 meq via ORAL
  Filled 2023-06-19: qty 2

## 2023-06-19 MED ORDER — MAGNESIUM SULFATE 2 GM/50ML IV SOLN
2.0000 g | Freq: Once | INTRAVENOUS | Status: AC
  Administered 2023-06-19: 2 g via INTRAVENOUS
  Filled 2023-06-19: qty 50

## 2023-06-19 NOTE — Care Management Important Message (Signed)
Important Message  Patient Details  Name: Marilyn Weber MRN: [id number] Date of Birth: [birth year]   Medicare Important Message Given:  Yes     Johnell Comings 06/19/2023, 10:55 AM

## 2023-06-19 NOTE — Anesthesia Postprocedure Evaluation (Signed)
Anesthesia Post Note  Patient: Marilyn Weber  Procedure(s) Performed: EXPLORATORY LAPAROTOMY SMALL BOWEL RESECTION (Abdomen) REPAIR ABDOMINAL WALL HERNIA (Abdomen)  Patient location during evaluation: PACU Anesthesia Type: General Level of consciousness: awake and alert Pain management: pain level controlled Vital Signs Assessment: post-procedure vital signs reviewed and stable Respiratory status: spontaneous breathing, nonlabored ventilation, respiratory function stable and patient connected to nasal cannula oxygen Cardiovascular status: blood pressure returned to baseline and stable Postop Assessment: no apparent nausea or vomiting Anesthetic complications: no   No notable events documented.   Last Vitals:  Vitals:   06/19/23 0435 06/19/23 0820  BP: (!) 140/73 (!) 149/73  Pulse: 68 63  Resp: 16 18  Temp: 37.1 C 36.7 C  SpO2: 97% 99%    Last Pain:  Vitals:   06/19/23 0435  TempSrc: Oral  PainSc:                  Marilyn Weber

## 2023-06-19 NOTE — Progress Notes (Signed)
Physical Therapy Treatment Patient Details Name: Marilyn Weber MRN: [id number] DOB: 1946-12-02 Today's Date: 06/19/2023   History of Present Illness presented to ER secondary to acute abdominal pain; admitted for management of strangulated hernia, s/p ex-lap, small-bowel resection (06/15/23)    PT Comments    Pt agrees with encouragement his am - prefers to watch her shows on TV.  She does get up and completes x 1 lap with RW and contact guard/supervision but declined further activity to return to her programs.  She walks in room leaving AD to the side holding onto bed for support.   Recommendations for follow up therapy are one component of a multi-disciplinary discharge planning process, led by the attending physician.  Recommendations may be updated based on patient status, additional functional criteria and insurance authorization.  Follow Up Recommendations       Assistance Recommended at Discharge    Patient can return home with the following A little help with walking and/or transfers;A little help with bathing/dressing/bathroom;Help with stairs or ramp for entrance   Equipment Recommendations  Rolling walker (2 wheels);BSC/3in1    Recommendations for Other Services       Precautions / Restrictions Precautions Precautions: Fall Restrictions Weight Bearing Restrictions: No     Mobility  Bed Mobility Overal bed mobility: Independent                  Transfers Overall transfer level: Modified independent Equipment used: Rolling walker (2 wheels) Transfers: Sit to/from Stand Sit to Stand: Supervision                Ambulation/Gait Ambulation/Gait assistance: Supervision Gait Distance (Feet): 160 Feet Assistive device: Rolling walker (2 wheels) Gait Pattern/deviations: Step-through pattern, Decreased step length - right, Decreased step length - left Gait velocity: decreased     General Gait Details: walks in room without RW but uses it in  hallway   Stairs             Wheelchair Mobility    Modified Rankin (Stroke Patients Only)       Balance Overall balance assessment: Mild deficits observed, not formally tested                                          Cognition Arousal/Alertness: Awake/alert Behavior During Therapy: WFL for tasks assessed/performed Overall Cognitive Status: Within Functional Limits for tasks assessed                                          Exercises      General Comments        Pertinent Vitals/Pain Pain Assessment Pain Assessment: No/denies pain    Home Living                          Prior Function            PT Goals (current goals can now be found in the care plan section) Progress towards PT goals: Progressing toward goals    Frequency    Min 3X/week      PT Plan Current plan remains appropriate    Co-evaluation              AM-PAC PT "6 Clicks" Mobility   Outcome Measure  Help needed turning from your back to your side while in a flat bed without using bedrails?: None Help needed moving from lying on your back to sitting on the side of a flat bed without using bedrails?: None Help needed moving to and from a bed to a chair (including a wheelchair)?: A Little Help needed standing up from a chair using your arms (e.g., wheelchair or bedside chair)?: None Help needed to walk in hospital room?: A Little Help needed climbing 3-5 steps with a railing? : A Little 6 Click Score: 21    End of Session Equipment Utilized During Treatment: Gait belt;Back brace Activity Tolerance: Patient tolerated treatment well Patient left: in chair;with call bell/phone within reach;with chair alarm set Nurse Communication: Mobility status PT Visit Diagnosis: Muscle weakness (generalized) (M62.81);Difficulty in walking, not elsewhere classified (R26.2)     Time: 1308-6578 PT Time Calculation (min) (ACUTE ONLY): 8  min  Charges:  $Gait Training: 8-22 mins                   Danielle Dess, PTA 06/19/23, 10:43 AM

## 2023-06-19 NOTE — Progress Notes (Signed)
[facility] SUR[facility]ICAL PRO[facility]ESS NOTE  Hospital Day(s): 5.   Post op day(s): 5 Days Post-Op.   Interval History:  Patient seen and examined No acute events or new complaints overnight.  Patient reports she is doing well this morning No significant abdominal pain, fever, chills, nausea, emesis  She is without leukocytosis; 9.7K Hgb to 12.0 Renal function normal; sCr - 0.61; UO - unmeasured Mild hypokalemia to 3.4; hypomagnesemia to 1.6 She is tolerating FLD Passing gas; + BM x3  Vital signs in last 24 hours: [min-max] current  Temp:  [98.3 F (36.8 C)-98.7 F (37.1 C)] 98.7 F (37.1 C) (07/01 0435) Pulse Rate:  [68-73] 68 (07/01 0435) Resp:  [16-18] 16 (07/01 0435) BP: (138-147)/(63-80) 140/73 (07/01 0435) SpO2:  [97 %-100 %] 97 % (07/01 0435)     Height: 5\' 4"  (162.6 cm) Weight: 81.6 kg BMI (Calculated): 30.88   Intake/Output last 2 shifts:  06/30 0701 - 07/01 0700 In: 160.3 [I.V.:160.3] Out: -    Physical Exam:  Constitutional: alert, cooperative and no distress  Respiratory: breathing non-labored at rest  Cardiovascular: regular rate and sinus rhythm  Gastrointestinal: soft, incisional soreness in LLQ, non-distended, no rebound/guarding Integumentary: Incision in LLQ is CDI with staples, no erythema, no drainage   Labs:     Latest Ref Rng & Units 06/19/2023    4:24 AM 06/17/2023    5:32 AM 06/16/2023    4:34 AM  CBC  WBC 4.0 - 10.5 K/uL 9.7  12.6  15.4   Hemoglobin 12.0 - 15.0 g/dL 16.1  09.6  04.5   Hematocrit 36.0 - 46.0 % 36.6  34.7  36.3   Platelets 150 - 400 K/uL 307  240  268       Latest Ref Rng & Units 06/19/2023    4:24 AM 06/17/2023    5:32 AM 06/16/2023    4:34 AM  CMP  Glucose 70 - 99 mg/dL 409  811  914   BUN 8 - 23 mg/dL 6  7  13    Creatinine 0.44 - 1.00 mg/dL 7.82  9.56  2.13   Sodium 135 - 145 mmol/L 135  138  139   Potassium 3.5 - 5.1 mmol/L 3.4  3.7  3.4   Chloride 98 - 111 mmol/L 101  111  108   CO2 22 - 32 mmol/L 26  24  24     Calcium 8.9 - 10.3 mg/dL 8.2  7.8  8.1   Total Protein 6.5 - 8.1 g/dL 6.1     Total Bilirubin 0.3 - 1.2 mg/dL 0.7     Alkaline Phos 38 - 126 U/L 77     AST 15 - 41 U/L 38     ALT 0 - 44 U/L 31       Imaging studies: No new pertinent imaging studies   Assessment/Plan:  77 y.o. female 5 Days Post-Op s/p small bowel resection and repair of left strangulated spigelian hernia   - Advance to soft diet - Monitor abdominal examination; on-going bowel function - Continue abdominal binder - Pain control prn; antiemetics prn - Mobilize; PT following    - Discharge Planning: Pending diet advancement today. If does well, will plan for discharge tomorrow (07/02).   All of the above findings and recommendations were discussed with the patient, and the medical team, and all of patient's questions were answered to her expressed satisfaction.  -- Lynden Oxford, PA-C [facility] 06/19/2023, 7:53 AM M-F: 7am - 4pm

## 2023-06-19 NOTE — Progress Notes (Signed)
Mobility Specialist - Progress Note   06/19/23 1500  Mobility  Activity Ambulated with assistance in room;Transferred from chair to bed  Level of Assistance Standby assist, set-up cues, supervision of patient - no hands on  Assistive Device Front wheel walker  Distance Ambulated (ft) 15 ft  Activity Response Tolerated well  $Mobility charge 1 Mobility     Pt ambulated in room with supervision. VC for safe technique back to bed as pt attempts knees first. Pillow wedge support. Pt left in bed with alarm set, needs in reach.    Filiberto Pinks Mobility Specialist 06/19/23, 3:06 PM

## 2023-06-20 LAB — GLUCOSE, CAPILLARY
Glucose-Capillary: 129 mg/dL — ABNORMAL HIGH (ref 70–99)
Glucose-Capillary: 167 mg/dL — ABNORMAL HIGH (ref 70–99)

## 2023-06-20 LAB — BASIC METABOLIC PANEL
Anion gap: 7 (ref 5–15)
BUN: 9 mg/dL (ref 8–23)
CO2: 25 mmol/L (ref 22–32)
Calcium: 8.4 mg/dL — ABNORMAL LOW (ref 8.9–10.3)
Chloride: 101 mmol/L (ref 98–111)
Creatinine, Ser: 0.68 mg/dL (ref 0.44–1.00)
GFR, Estimated: >60 mL/min (ref >60)
Glucose, Bld: 123 mg/dL — ABNORMAL HIGH (ref 70–99)
Potassium: 3.4 mmol/L — ABNORMAL LOW (ref 3.5–5.1)
Sodium: 133 mmol/L — ABNORMAL LOW (ref 135–145)

## 2023-06-20 LAB — MAGNESIUM: Magnesium: 2 mg/dL (ref 1.7–2.4)

## 2023-06-20 MED ORDER — OXYCODONE HCL 5 MG PO TABS: 5.0000 mg | ORAL_TABLET | Freq: Four times a day (QID) | ORAL | 0 refills | Status: AC | PRN

## 2023-06-20 MED ORDER — IBUPROFEN 600 MG PO TABS: 600.0000 mg | ORAL_TABLET | Freq: Four times a day (QID) | ORAL | 0 refills | Status: AC | PRN

## 2023-06-20 MED ORDER — POTASSIUM CHLORIDE CRYS ER 20 MEQ PO TBCR
40.0000 meq | EXTENDED_RELEASE_TABLET | Freq: Once | ORAL | Status: AC
  Administered 2023-06-20: 40 meq via ORAL
  Filled 2023-06-20: qty 2

## 2023-06-20 NOTE — Progress Notes (Signed)
Mobility Specialist - Progress Note   06/20/23 1102  Mobility  Activity Ambulated with assistance in room;Ambulated with assistance to bathroom;Transferred from chair to bed  Level of Assistance Standby assist, set-up cues, supervision of patient - no hands on  Assistive Device Front wheel walker  Distance Ambulated (ft) 32 ft  Activity Response Tolerated well  $Mobility charge 1 Mobility  Mobility Specialist Start Time (ACUTE ONLY) 1020  Mobility Specialist Stop Time (ACUTE ONLY) 1035  Mobility Specialist Time Calculation (min) (ACUTE ONLY) 15 min   Zetta Bills Mobility Specialist 06/20/23 11:04 AM

## 2023-06-20 NOTE — TOC Transition Note (Signed)
Transition of Care [facility]) - CM/SW Discharge Note   Patient Details  Name: Marilyn Weber MRN: [id number] Date of Birth: October 18, 1946  [facility]) CM/SW Contact:  Chapman Fitch, RN Phone Number: 06/20/2023, 11:35 AM   Clinical Narrative:    Referral made to Jon with [employer] for RW and BSC to be delivered to room prior to dc   Final next level of care: Home w Home Health Services Barriers to Discharge: Continued Medical Work up   Patient Goals and CMS Choice      Discharge Placement                         Discharge Plan and Services Additional resources added to the After Visit Summary for                            [facility] Arranged: PT, OT HH Agency: [facility] Date [facility] Agency Contacted: 06/16/23 Time HH Agency Contacted: 1327 Representative spoke with at [facility] Agency: [name]  Social Determinants of Health (SDOH) Interventions SDOH Screenings   Food Insecurity: No Food Insecurity (06/15/2023)  Housing: Low Risk  (06/15/2023)  Transportation Needs: No Transportation Needs (06/15/2023)  Utilities: Not At Risk (06/15/2023)  Alcohol Screen: Low Risk  (05/01/2020)  Tobacco Use: Low Risk  (06/15/2023)     Readmission Risk Interventions     No data to display

## 2023-06-20 NOTE — Discharge Summary (Signed)
[facility] SURGICAL DISCHARGE SUMMARY  Patient ID: Marilyn Weber MRN: [id number] DOB/AGE: Nov 08, 1946 77 y.o.  Admit date: 06/14/2023 Discharge date: 06/20/2023  Discharge Diagnoses Patient Active Problem List   Diagnosis Date Noted   Strangulated hernia of abdominal wall 06/14/2023    Consultants None  Procedures 06/15/2023:  small bowel resection and repair of left strangulated spigelian hernia    HPI / Hospital Course: Marilyn Weber is 77 y.o. female who presented to the ED on 06/26 secondary to LLQ abdominal pain. She was found to have strangulated left spigelian hernia. Informed consent was obtained and documented, and patient underwent uneventful small bowel resection and repair of left strangulated spigelian hernia  (Dr Claudine Mouton, 06/15/2023).  Post-operatively, patient did well. She did have mild ileus but never required TPN nor NGT. Advancement of patient's diet and ambulation were well-tolerated. The remainder of patient's hospital course was essentially unremarkable, and discharge planning was initiated accordingly with patient safely able to be discharged home with appropriate discharge instructions, pain control, and outpatient follow-up after all of her questions were answered to her expressed satisfaction.   Discharge Condition: Good   Physical Examination:  Constitutional: alert, cooperative and no distress  Respiratory: breathing non-labored at rest  Cardiovascular: regular rate and sinus rhythm  Gastrointestinal: soft, incisional soreness in LLQ, non-distended, no rebound/guarding Integumentary: Incision in LLQ is CDI with staples, no erythema, no drainage    Allergies as of 06/20/2023       Reactions   Lisinopril Swelling   Tongue swelling Other reaction(s): anaphylaxis   Penicillins Rash   Has patient had a PCN reaction causing immediate rash, facial/tongue/throat swelling, SOB or lightheadedness with hypotension: YesYes Has patient had a PCN  reaction causing severe rash involving mucus membranes or skin necrosis: No Has patient had a PCN reaction that required hospitalization:  Has patient had a PCN reaction occurring within the last 10 years: Unknown If all of the above answers are "NO", then may proceed with Cephalosporin use.        Medication List     TAKE these medications    ARIPiprazole 5 MG tablet Commonly known as: ABILIFY Take 1 tablet by mouth daily.   aspirin EC 81 MG tablet Take 81 mg by mouth daily.   empagliflozin 25 MG Tabs tablet Commonly known as: JARDIANCE Take 25 mg by mouth daily.   fluticasone 50 MCG/ACT nasal spray Commonly known as: FLONASE Place into both nostrils.   fluticasone-salmeterol 250-50 MCG/ACT Aepb Commonly known as: ADVAIR 1 puff   gabapentin 300 MG capsule Commonly known as: NEURONTIN Take 600-900 mg by mouth 3 (three) times daily.   glipiZIDE 10 MG 24 hr tablet Commonly known as: GLUCOTROL XL Take 10 mg by mouth daily.   hydrALAZINE 25 MG tablet Commonly known as: APRESOLINE Take 25 mg by mouth 3 (three) times daily.   hydrochlorothiazide 25 MG tablet Commonly known as: HYDRODIURIL Take 1 tablet (25 mg total) by mouth daily.   hydrOXYzine 25 MG tablet Commonly known as: ATARAX Take 25 mg by mouth 3 (three) times daily as needed.   ibuprofen 600 MG tablet Commonly known as: ADVIL Take 1 tablet (600 mg total) by mouth every 6 (six) hours as needed for moderate pain, mild pain or fever.   insulin glargine 100 UNIT/ML injection Commonly known as: LANTUS Inject 0.15 mLs (15 Units total) into the skin at bedtime.   meloxicam 15 MG tablet Commonly known as: MOBIC Take 15 mg by mouth daily.  mirtazapine 15 MG tablet Commonly known as: REMERON Take 7.5 mg by mouth at bedtime.   omeprazole 20 MG capsule Commonly known as: PRILOSEC Take 20 mg by mouth daily.   ondansetron 4 MG disintegrating tablet Commonly known as: ZOFRAN-ODT Take 4 mg by mouth every  8 (eight) hours as needed for nausea or vomiting.   ondansetron 8 MG tablet Commonly known as: ZOFRAN Take 8 mg by mouth every 8 (eight) hours as needed for vomiting or nausea.   oxyCODONE 5 MG immediate release tablet Commonly known as: Oxy IR/ROXICODONE Take 1 tablet (5 mg total) by mouth every 6 (six) hours as needed for severe pain or breakthrough pain.   Ozempic (1 MG/DOSE) 4 MG/3ML Sopn Generic drug: Semaglutide (1 MG/DOSE) Inject 1 mg into the skin once a week.   propranolol 20 MG tablet Commonly known as: INDERAL Take 20 mg by mouth every 6 (six) hours as needed.   QC Acetaminophen 8Hr Arth Pain 650 MG CR tablet Generic drug: acetaminophen SMARTSIG:1-2 Tablet(s) By Mouth Every 8 Hours PRN   Unithroid 112 MCG tablet Generic drug: levothyroxine Take 112 mcg by mouth daily.   valACYclovir 1000 MG tablet Commonly known as: VALTREX Take 1,000 mg by mouth 3 (three) times daily.   Yagers Liniment 3.1-8.2 % Liqd Apply topically as needed.          Follow-up Information     Campbell Lerner, MD. Go on 06/29/2023.   Specialty: General Surgery Why: Go to appointment on 07/11 at 1030 AM Contact information: [address] [place] [address] [phone]                  Time spent on discharge management including discussion of hospital course, clinical condition, outpatient instructions, prescriptions, and follow up with the patient and members of the medical team: >30 minutes  -- Lynden Oxford , PA-C [facility]  06/20/2023, 8:17 AM [phone] M-F: 7am - 4pm

## 2023-06-20 NOTE — TOC Transition Note (Signed)
Transition of Care [facility]) - CM/SW Discharge Note   Patient Details  Name: Marilyn Weber MRN: [id number] Date of Birth: 03-Feb-1946  Transition of Care [facility]) CM/SW Contact:  Chapman Fitch, RN Phone Number: 06/20/2023, 9:19 AM   Clinical Narrative:    Patient to discharge today [name] wit [facility] notified of discharge    Final next level of care: Home w Home Health Services Barriers to Discharge: Continued Medical Work up   Patient Goals and CMS Choice      Discharge Placement                         Discharge Plan and Services Additional resources added to the After Visit Summary for                            [facility] Arranged: PT, OT HH Agency: [facility] Date [facility] Agency Contacted: 06/16/23 Time HH Agency Contacted: 1327 Representative spoke with at [facility] Agency: [name]  Social Determinants of Health (SDOH) Interventions SDOH Screenings   Food Insecurity: No Food Insecurity (06/15/2023)  Housing: Low Risk  (06/15/2023)  Transportation Needs: No Transportation Needs (06/15/2023)  Utilities: Not At Risk (06/15/2023)  Alcohol Screen: Low Risk  (05/01/2020)  Tobacco Use: Low Risk  (06/15/2023)     Readmission Risk Interventions     No data to display

## 2023-06-20 NOTE — Discharge Instructions (Signed)
In addition to included general post-operative instructions,  Diet: Resume home diet.   Activity: No heavy lifting >20 pounds (children, pets, laundry, garbage) or strenuous activity for 6 weeks from date of surgery, but light activity and walking are encouraged. Do not drive or drink alcohol if taking narcotic pain medications or having pain that might distract from driving. Recommend wearing abdominal binder daily for at least 4 weeks. Oky to remove to shower.   Wound care: You may shower/get incision wet with soapy water and pat dry (do not rub incisions), but no baths or submerging incision underwater until follow-up. We will remove staples at follow up appointment on 07/11  Medications: Resume all home medications. For mild to moderate pain: acetaminophen (Tylenol) or ibuprofen/naproxen (if no kidney disease). Combining Tylenol with alcohol can substantially increase your risk of causing liver disease. Narcotic pain medications, if prescribed, can be used for severe pain, though may cause nausea, constipation, and drowsiness. Do not combine Tylenol and Percocet (or similar) within a 6 hour period as Percocet (and similar) contain(s) Tylenol. If you do not need the narcotic pain medication, you do not need to fill the prescription.  Call office [phone] / [phone]) at any time if any questions, worsening pain, fevers/chills, bleeding, drainage from incision site, or other concerns.

## 2023-06-20 NOTE — Progress Notes (Signed)
Patient is not able to walk the distance required to go the bathroom, or he/she is unable to safely negotiate stairs required to access the bathroom.  A 3in1 BSC will alleviate this problem  

## 2023-06-29 ENCOUNTER — Ambulatory Visit (INDEPENDENT_AMBULATORY_CARE_PROVIDER_SITE_OTHER): Payer: 59 | Admitting: Surgery

## 2023-06-29 ENCOUNTER — Encounter: Payer: Self-pay | Admitting: Surgery

## 2023-06-29 VITALS — BP 96/64 | HR 98 | Temp 98.0°F | Ht 64.0 in | Wt 175.0 lb

## 2023-06-29 DIAGNOSIS — K436 Other and unspecified ventral hernia with obstruction, without gangrene: Secondary | ICD-10-CM

## 2023-06-29 DIAGNOSIS — Z09 Encounter for follow-up examination after completed treatment for conditions other than malignant neoplasm: Secondary | ICD-10-CM | POA: Insufficient documentation

## 2023-06-29 DIAGNOSIS — Z9049 Acquired absence of other specified parts of digestive tract: Secondary | ICD-10-CM

## 2023-06-29 NOTE — Progress Notes (Signed)
[facility] POST-OP OFFICE VISIT  06/29/2023  HPI: Marilyn Weber is a 77 y.o. female 16 days s/p open small bowel resection for strangulated spigelian hernia, and primary repair of fascial defect.  She reports she is still sore in the left lower quadrant.  Otherwise her bowel habits being more constipated. She denies fevers and chills, she denies nausea and vomiting.  Otherwise doing well.  Vital signs: BP 96/64   Pulse 98   Temp 98 F (36.7 C)   Ht 5\' 4"  (1.626 m)   Wt 175 lb (79.4 kg)   SpO2 98%   BMI 30.04 kg/m    Physical Exam: Constitutional: She appears at her baseline.  Ambulating by cane. Abdomen: Soft and benign. Skin: Left lower quadrant incision is clean dry and intact.  The staples were removed with replacement by Steri-Strips over Mastisol.  No erythema, no evidence of drainage.  No evidence of hematoma.  Some residual mass effect likely due to the underlying healing process.  Assessment/Plan: This is a 77 y.o. female 16 days s/p small bowel resection for strangulation of left spigelian hernia.  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  We will advise her regarding utilizing MiraLAX to help with her constipation.  And remain readily available to assist her in any other way.  We anticipate seeing her again.   Campbell Lerner M.D., FACS 06/29/2023, 12:19 PM

## 2023-06-29 NOTE — Patient Instructions (Addendum)
We want you to take Miralax twice a day until you have a good bowel movement then you can change to once a day. Be sure to drink plenty of liquids with this.   Wear the binder for another month. You may take this off and hand wash it and line dry overnight.   Follow up here in 1 month.

## 2023-06-30 ENCOUNTER — Ambulatory Visit: Payer: Self-pay

## 2023-06-30 NOTE — Telephone Encounter (Addendum)
Chief Complaint: Constipation Symptoms: hard stools, small bowel movements and pain  Frequency: ongoing Pertinent Negatives: Patient denies blood in the stool Disposition: [] ED /[] Urgent Care (no appt availability in office) / [] Appointment(In office/virtual)/ []  [facility]/ [] Home Care/ [] Refused Recommended Disposition /[] [facility] [place] [facility]/ [x]  Follow-up with PCP Additional Notes: Patient reports difficulties having a bowel movement recently. Patient states this is an ongoing problem due to the medications she takes daily. Patient has tried Mirilax today and had a small bowel movement that was hard and painful. Patient states she is active and drinks plenty of water through out the day Patient asked what else she could do to have a bowel movement. Care advise given and advised patient to call PCP to schedule an  appointment with provider if symptoms do not improve. Patient verbalized understanding.  Summary: Bowel movement issues   Pt is calling in because she cannot have a bowel movement and wants to know what can she do.     Reason for Disposition  Unable to have a bowel movement (BM) without laxative or enema  Answer Assessment - Initial Assessment Questions 1. STOOL PATTERN OR FREQUENCY: "How often do you have a bowel movement (BM)?"  (Normal range: 3 times a day to every 3 days)  "When was your last BM?"       A few days ago 2. STRAINING: "Do you have to strain to have a BM?"      Yes  3. RECTAL PAIN: "Does your rectum hurt when the stool comes out?" If Yes, ask: "Do you have hemorrhoids? How bad is the pain?"  (Scale 1-10; or mild, moderate, severe)     Yes it hurts 10/10 4. STOOL COMPOSITION: "Are the stools hard?"      Yes  5. BLOOD ON STOOLS: "Has there been any blood on the toilet tissue or on the surface of the BM?" If Yes, ask: "When was the last time?"     No, I don't think so 6. CHRONIC CONSTIPATION: "Is this a new problem for you?"  If No, ask: "How long  have you had this problem?" (days, weeks, months)      Yes it is chronic 7. CHANGES IN DIET OR HYDRATION: "Have there been any recent changes in your diet?" "How much fluids are you drinking on a daily basis?"  "How much have you had to drink today?"     I drink a lot of water through out the day 8. MEDICINES: "Have you been taking any new medicines?" "Are you taking any narcotic pain medicines?" (e.g., Dilaudid, morphine, Percocet, Vicodin)     Yes, oxycodone and ibuprofen 9. LAXATIVES: "Have you been using any stool softeners, laxatives, or enemas?"  If Yes, ask "What, how often, and when was the last time?"     Mirilax castro oil 10. ACTIVITY:  "How much walking do you do every day?"  "Has your activity level decreased in the past week?"        Yes I walk around a lot  11. CAUSE: "What do you think is causing the constipation?"        My medicine 12. OTHER SYMPTOMS: "Do you have any other symptoms?" (e.g., abdomen pain, bloating, fever, vomiting)       No  13. MEDICAL HISTORY: "Do you have a history of hemorrhoids, rectal fissures, or rectal surgery or rectal abscess?"         hemorrhiods  Protocols used: Constipation-A-AH

## 2023-08-01 ENCOUNTER — Ambulatory Visit (INDEPENDENT_AMBULATORY_CARE_PROVIDER_SITE_OTHER): Payer: 59 | Admitting: Surgery

## 2023-08-01 ENCOUNTER — Encounter: Payer: Self-pay | Admitting: Surgery

## 2023-08-01 VITALS — BP 128/77 | HR 62 | Temp 98.0°F | Ht 64.0 in | Wt 173.0 lb

## 2023-08-01 DIAGNOSIS — K436 Other and unspecified ventral hernia with obstruction, without gangrene: Secondary | ICD-10-CM

## 2023-08-01 DIAGNOSIS — Z09 Encounter for follow-up examination after completed treatment for conditions other than malignant neoplasm: Secondary | ICD-10-CM

## 2023-08-01 NOTE — Progress Notes (Signed)
[facility] POST-OP OFFICE VISIT  08/01/2023  HPI: Marilyn Weber is a 77 y.o. 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  She reports skipping days, as far as having a bowel movement goes.  She takes some intermittent capsule laxative.  Encouraged her to consider taking MiraLAX on a daily basis.  She reports she is ready to return to her part-time job.  Denies any pain with the hernia.    Vital signs: BP 128/77   Pulse 62   Temp 98 F (36.7 C) (Oral)   Ht 5\' 4"  (1.626 m)   Wt 173 lb (78.5 kg)   SpO2 99%   BMI 29.70 kg/m    Physical Exam: Constitutional: She appears well, at her baseline Abdomen: Abdomen is soft and nontender.  Incision is well-healed, and there is no underlying tenderness, defect or mass present.   Assessment/Plan: This is a 77 y.o. female  s/p small bowel resection and primary open repair of spigelian hernia.  I believe she is ready to resume her part-time work.  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  Or as needed   Campbell Lerner M.D., [facility] 08/01/2023, 1:48 PM

## 2023-08-01 NOTE — Patient Instructions (Signed)
You may take Miralax everyday for constipation.   If you have any concerns or questions, please feel free to call our office.   Hernia, Adult     A hernia happens when an organ or tissue inside your body pushes out through a weak spot in the muscles of your belly (abdomen). This makes a bulge. The bulge may be: In a scar from a surgery that was done in your belly (incisional hernia). Near your belly button (umbilical hernia). In your groin (inguinal hernia). Your groin is the area where your leg meets your lower belly. If you are a female, this type could also be in your scrotum. In your upper thigh (femoral hernia). Inside your belly (hiatal hernia). This happens when your stomach slides above the muscle between your belly and your chest (diaphragm). What are the causes? This condition may be caused by: Lifting heavy things. Coughing over a long period of time. Having trouble pooping (constipation). Trouble pooping can lead to straining. A cut from surgery in your belly. A physical problem that is present at birth. Being very overweight. Smoking. Too much fluid in your belly. A testicle that has not moved down into the scrotum, in males. What are the signs or symptoms? The main symptom is a bulge in the area of the hernia, but a bulge may not always be seen. It may grow bigger or be easier to see when you cough or strain (such as when lifting something heavy). A hernia that can be pushed back into the belly rarely causes pain. A hernia that cannot be pushed back into the belly may lose its blood supply. This may cause: Pain. Fever. A feeling like you may vomit, and vomiting. Swelling. Trouble pooping. How is this treated? A hernia that is small and painless may not need to be treated. A hernia that is large or painful may be treated with surgery. Surgery to treat a hernia involves pushing the bulge back into place and repairing the weak area of the muscle or belly. Follow these  instructions at home: Activity Avoid straining the muscles near your hernia. This can happen when you: Lift something heavy. Poop (have a bowel movement). Do not lift anything that is heavier than 10 lb (4.5 kg), or the limit that you are told. When you lift something heavy, use your leg muscles. Do not use your back muscles to lift. Prevent trouble pooping If told by your doctor, take steps to prevent trouble pooping. You may need to: Drink enough fluid to keep your pee (urine) pale yellow. Take medicines. You will be told what medicines to take. Eat foods that are high in fiber. These include beans, whole grains, and fresh fruits and vegetables. Limit foods that are high in fat and sugar. These include fried or sweet foods. General instructions When you cough, try to cough gently. You may try to push your hernia back in by gently pressing on it when you are lying down. Do not try to force the bulge back in if it will not go in easily. If you are overweight, work with your doctor to lose weight safely. Do not smoke or use any products that contain nicotine or tobacco. If you need help quitting, ask your doctor. If you will be having surgery, watch your hernia for changes in shape, size, or color. Tell your doctor if you see any changes. Take over-the-counter and prescription medicines only as told by your doctor. Keep all follow-up visits. Contact a doctor if:  You get new pain, swelling, or redness near your hernia. You poop fewer times in a week than normal. You have trouble pooping. You have poop that is more dry than normal. You have poop that is harder or larger than normal. Get help right away if: You have a fever or chills. You have belly pain that gets worse. You feel like you may vomit, or you vomit. Your hernia cannot be pushed in by gently pressing on it when you are lying down. Your hernia: Changes in shape or size. Changes color. Feels hard, or it hurts when you touch  it. These symptoms may be an emergency. Get help right away. Call your local emergency services (911 in the [place] Do not wait to see if the symptoms will go away. Do not drive yourself to the hospital. Summary A hernia happens when an organ or tissue inside your body pushes out through a weak spot in the belly muscles. This creates a bulge. If your hernia is small and it does not hurt, you may not need treatment. If your hernia is large or it hurts, you may need surgery. If you will be having surgery, watch your hernia for changes in shape, size, or color. Tell your doctor about any changes. This information is not intended to replace advice given to you by your health care provider. Make sure you discuss any questions you have with your health care provider. Document Revised: 07/13/2020 Document Reviewed: 07/13/2020 Elsevier Patient Education  2024 [employer].
